# Patient Record
Sex: Female | Born: 1935 | Race: White | Hispanic: No | Smoking: Current every day smoker
Health system: Southern US, Community
[De-identification: ages and names within clinical notes are randomized; demographics above are authoritative.]

## PROBLEM LIST (undated history)

## (undated) DIAGNOSIS — F329 Major depressive disorder, single episode, unspecified: Secondary | ICD-10-CM

## (undated) DIAGNOSIS — R059 Cough, unspecified: Secondary | ICD-10-CM

## (undated) DIAGNOSIS — R05 Cough: Secondary | ICD-10-CM

## (undated) DIAGNOSIS — F0390 Unspecified dementia without behavioral disturbance: Secondary | ICD-10-CM

## (undated) DIAGNOSIS — F32A Depression, unspecified: Secondary | ICD-10-CM

## (undated) DIAGNOSIS — M1990 Unspecified osteoarthritis, unspecified site: Secondary | ICD-10-CM

## (undated) DIAGNOSIS — R51 Headache: Secondary | ICD-10-CM

## (undated) DIAGNOSIS — I2699 Other pulmonary embolism without acute cor pulmonale: Secondary | ICD-10-CM

## (undated) DIAGNOSIS — I10 Essential (primary) hypertension: Secondary | ICD-10-CM

## (undated) DIAGNOSIS — M069 Rheumatoid arthritis, unspecified: Secondary | ICD-10-CM

## (undated) DIAGNOSIS — R918 Other nonspecific abnormal finding of lung field: Secondary | ICD-10-CM

## (undated) HISTORY — PX: CARPAL TUNNEL RELEASE: SHX101

## (undated) HISTORY — PX: ROTATOR CUFF REPAIR: SHX139

## (undated) HISTORY — PX: EYE SURGERY: SHX253

## (undated) HISTORY — PX: SHOULDER ARTHROSCOPY: SHX128

## (undated) HISTORY — PX: FRACTURE SURGERY: SHX138

---

## 2013-08-26 ENCOUNTER — Other Ambulatory Visit: Payer: Self-pay | Admitting: Neurosurgery

## 2013-09-09 ENCOUNTER — Encounter (HOSPITAL_COMMUNITY): Payer: Self-pay

## 2013-09-10 ENCOUNTER — Other Ambulatory Visit (HOSPITAL_COMMUNITY): Payer: Self-pay

## 2013-09-13 ENCOUNTER — Encounter (HOSPITAL_COMMUNITY): Payer: Self-pay

## 2013-09-13 ENCOUNTER — Encounter (HOSPITAL_COMMUNITY)
Admission: RE | Admit: 2013-09-13 | Discharge: 2013-09-13 | Disposition: A | Discharge status: Home / Self Care | Payer: Medicare Other | Source: Ambulatory Visit | Attending: Neurosurgery | Admitting: Neurosurgery

## 2013-09-13 DIAGNOSIS — Z01818 Encounter for other preprocedural examination: Secondary | ICD-10-CM | POA: Insufficient documentation

## 2013-09-13 DIAGNOSIS — Z01812 Encounter for preprocedural laboratory examination: Secondary | ICD-10-CM | POA: Insufficient documentation

## 2013-09-13 HISTORY — DX: Major depressive disorder, single episode, unspecified: F32.9

## 2013-09-13 HISTORY — DX: Headache: R51

## 2013-09-13 HISTORY — DX: Depression, unspecified: F32.A

## 2013-09-13 HISTORY — DX: Unspecified dementia, unspecified severity, without behavioral disturbance, psychotic disturbance, mood disturbance, and anxiety: F03.90

## 2013-09-13 LAB — COMPREHENSIVE METABOLIC PANEL
ALT: 8 U/L (ref 0–35)
AST: 15 U/L (ref 0–37)
Albumin: 3.9 g/dL (ref 3.5–5.2)
Alkaline Phosphatase: 63 U/L (ref 39–117)
BUN: 12 mg/dL (ref 6–23)
CO2: 27 mEq/L (ref 19–32)
Calcium: 11 mg/dL — ABNORMAL HIGH (ref 8.4–10.5)
Chloride: 105 mEq/L (ref 96–112)
Creatinine, Ser: 1.33 mg/dL — ABNORMAL HIGH (ref 0.50–1.10)
GFR calc Af Amer: 43 mL/min — ABNORMAL LOW (ref >90)
GFR calc non Af Amer: 37 mL/min — ABNORMAL LOW (ref >90)
Glucose, Bld: 88 mg/dL (ref 70–99)
Potassium: 3.6 mEq/L (ref 3.5–5.1)
Sodium: 143 mEq/L (ref 135–145)
Total Bilirubin: 0.3 mg/dL (ref 0.3–1.2)
Total Protein: 7.2 g/dL (ref 6.0–8.3)

## 2013-09-13 LAB — CBC
HCT: 39 % (ref 36.0–46.0)
Hemoglobin: 12.9 g/dL (ref 12.0–15.0)
MCH: 29.4 pg (ref 26.0–34.0)
MCHC: 33.1 g/dL (ref 30.0–36.0)
MCV: 88.8 fL (ref 78.0–100.0)
Platelets: 275 10*3/uL (ref 150–400)
RBC: 4.39 MIL/uL (ref 3.87–5.11)
RDW: 15 % (ref 11.5–15.5)
WBC: 9 10*3/uL (ref 4.0–10.5)

## 2013-09-13 LAB — SURGICAL PCR SCREEN
MRSA, PCR: NEGATIVE
Staphylococcus aureus: POSITIVE — AB

## 2013-09-13 NOTE — Pre-Procedure Instructions (Signed)
Marcia Lynch  09/13/2013   Your procedure is scheduled on:  Monday, Sept. 29th   Report to [facility] at  5:30 AM.             Marcia Lynch will come through Entrance "A")  Call this number if you have problems the morning of surgery: [phone]   Remember:   Do not eat food or drink liquids after midnight Sunday.   Take these medicines the morning of surgery with A SIP OF WATER: Aricept, Zoloft, Tramadol   Do not wear jewelry, make-up or nail polish.  Do not wear lotions, powders, or perfumes. You may NOT wear deodorant.  Do not shave 48 hours prior to surgery.    Do not bring valuables to the hospital.  [facility] is not responsible for any belongings or valuables.  Contacts, dentures or bridgework may not be worn into surgery.   Leave suitcase in the car. After surgery it may be brought to your room.  For patients admitted to the hospital, checkout time is 11:00 AM the day of discharge.   Name and phone number of your driver:    Special Instructions: Shower using CHG 2 nights before surgery and the night before surgery.  If you shower the day of surgery use CHG.  Use special wash - you have one bottle of CHG for all showers.  You should use approximately 1/3 of the bottle for each shower.   Please read over the following fact sheets that you were given: Pain Booklet and Surgical Site Infection Prevention

## 2013-09-13 NOTE — Progress Notes (Addendum)
Pt had episode around 6 mths ago, where friends could not get her awake. She has not exhibited those symptoms since then.    She was then taken to [facility], where a number of tests were run.  I have requested notes, EKG, CXR, from that admission.  DA  (otherwise, EKG & CXR will be done  DOS)  Received Echo, 2013 Cxr, CT scans--EKG & CXR to be done the dos.  DA

## 2013-09-19 MED ORDER — CEFAZOLIN SODIUM-DEXTROSE 2-3 GM-% IV SOLR
2.0000 g | INTRAVENOUS | Status: AC
  Administered 2013-09-20: 2 g via INTRAVENOUS
  Filled 2013-09-19: qty 50

## 2013-09-20 ENCOUNTER — Encounter (HOSPITAL_COMMUNITY): Payer: Self-pay | Admitting: Certified Registered"

## 2013-09-20 ENCOUNTER — Encounter (HOSPITAL_COMMUNITY)
Admission: RE | Disposition: A | Discharge status: Home / Self Care | Payer: Self-pay | Source: Ambulatory Visit | Attending: Neurosurgery

## 2013-09-20 ENCOUNTER — Encounter (HOSPITAL_COMMUNITY): Payer: Self-pay | Admitting: Anesthesiology

## 2013-09-20 ENCOUNTER — Inpatient Hospital Stay (HOSPITAL_COMMUNITY)
Admission: RE | Admit: 2013-09-20 | Discharge: 2013-09-22 | DRG: 473 | Disposition: A | Discharge status: Home / Self Care | Payer: Medicare Other | Source: Ambulatory Visit | Attending: Neurosurgery | Admitting: Neurosurgery

## 2013-09-20 ENCOUNTER — Inpatient Hospital Stay (HOSPITAL_COMMUNITY): Payer: Medicare Other | Admitting: Anesthesiology

## 2013-09-20 ENCOUNTER — Inpatient Hospital Stay (HOSPITAL_COMMUNITY): Payer: Medicare Other

## 2013-09-20 DIAGNOSIS — F17200 Nicotine dependence, unspecified, uncomplicated: Secondary | ICD-10-CM | POA: Diagnosis present

## 2013-09-20 DIAGNOSIS — N289 Disorder of kidney and ureter, unspecified: Secondary | ICD-10-CM | POA: Diagnosis present

## 2013-09-20 DIAGNOSIS — M4712 Other spondylosis with myelopathy, cervical region: Principal | ICD-10-CM | POA: Diagnosis present

## 2013-09-20 DIAGNOSIS — F329 Major depressive disorder, single episode, unspecified: Secondary | ICD-10-CM | POA: Diagnosis present

## 2013-09-20 DIAGNOSIS — F3289 Other specified depressive episodes: Secondary | ICD-10-CM | POA: Diagnosis present

## 2013-09-20 DIAGNOSIS — Z79899 Other long term (current) drug therapy: Secondary | ICD-10-CM

## 2013-09-20 DIAGNOSIS — F0390 Unspecified dementia without behavioral disturbance: Secondary | ICD-10-CM | POA: Diagnosis present

## 2013-09-20 HISTORY — PX: POSTERIOR CERVICAL FUSION/FORAMINOTOMY: SHX5038

## 2013-09-20 SURGERY — POSTERIOR CERVICAL FUSION/FORAMINOTOMY LEVEL 4
Anesthesia: General | Site: Neck | Wound class: Clean

## 2013-09-20 MED ORDER — SODIUM CHLORIDE 0.9 % IV SOLN: 250.0000 mL | INTRAVENOUS | Status: DC

## 2013-09-20 MED ORDER — LIDOCAINE-EPINEPHRINE 1 %-1:100000 IJ SOLN
INTRAMUSCULAR | Status: DC | PRN
  Administered 2013-09-20: 7 mL

## 2013-09-20 MED ORDER — BUPIVACAINE HCL (PF) 0.25 % IJ SOLN
INTRAMUSCULAR | Status: DC | PRN
  Administered 2013-09-20: 7 mL

## 2013-09-20 MED ORDER — MIRTAZAPINE 15 MG PO TABS
15.0000 mg | ORAL_TABLET | Freq: Every evening | ORAL | Status: DC | PRN
  Administered 2013-09-21: 15 mg via ORAL
  Filled 2013-09-20: qty 1

## 2013-09-20 MED ORDER — TRAMADOL HCL 50 MG PO TABS
50.0000 mg | ORAL_TABLET | Freq: Three times a day (TID) | ORAL | Status: DC
  Administered 2013-09-20 – 2013-09-22 (×7): 50 mg via ORAL
  Filled 2013-09-20 (×7): qty 1

## 2013-09-20 MED ORDER — ALENDRONATE SODIUM 70 MG PO TABS: 70.0000 mg | ORAL_TABLET | ORAL | Status: DC

## 2013-09-20 MED ORDER — ONDANSETRON HCL 4 MG/2ML IJ SOLN
INTRAMUSCULAR | Status: DC | PRN
  Administered 2013-09-20: 4 mg via INTRAVENOUS

## 2013-09-20 MED ORDER — ARTIFICIAL TEARS OP OINT
TOPICAL_OINTMENT | OPHTHALMIC | Status: DC | PRN
  Administered 2013-09-20: 1 via OPHTHALMIC

## 2013-09-20 MED ORDER — FENTANYL CITRATE 0.05 MG/ML IJ SOLN
25.0000 ug | INTRAMUSCULAR | Status: DC | PRN
  Administered 2013-09-20: 25 ug via INTRAVENOUS
  Administered 2013-09-20: 50 ug via INTRAVENOUS

## 2013-09-20 MED ORDER — FENTANYL CITRATE 0.05 MG/ML IJ SOLN
INTRAMUSCULAR | Status: AC
  Filled 2013-09-20: qty 2

## 2013-09-20 MED ORDER — CEFAZOLIN SODIUM 1-5 GM-% IV SOLN
1.0000 g | Freq: Three times a day (TID) | INTRAVENOUS | Status: AC
  Administered 2013-09-20 – 2013-09-21 (×2): 1 g via INTRAVENOUS
  Filled 2013-09-20 (×3): qty 50

## 2013-09-20 MED ORDER — SERTRALINE HCL 100 MG PO TABS
100.0000 mg | ORAL_TABLET | Freq: Every day | ORAL | Status: DC
  Administered 2013-09-21 – 2013-09-22 (×2): 100 mg via ORAL
  Filled 2013-09-20 (×3): qty 1

## 2013-09-20 MED ORDER — SODIUM CHLORIDE 0.9 % IJ SOLN: 3.0000 mL | INTRAMUSCULAR | Status: DC | PRN

## 2013-09-20 MED ORDER — ONDANSETRON HCL 4 MG/2ML IJ SOLN: 4.0000 mg | INTRAMUSCULAR | Status: DC | PRN

## 2013-09-20 MED ORDER — SURGIFOAM 100 EX MISC
CUTANEOUS | Status: DC | PRN
  Administered 2013-09-20: 08:00:00 via TOPICAL

## 2013-09-20 MED ORDER — LIDOCAINE HCL (CARDIAC) 20 MG/ML IV SOLN
INTRAVENOUS | Status: DC | PRN
  Administered 2013-09-20: 60 mg via INTRAVENOUS

## 2013-09-20 MED ORDER — LACTATED RINGERS IV SOLN
INTRAVENOUS | Status: DC | PRN
  Administered 2013-09-20 (×4): via INTRAVENOUS

## 2013-09-20 MED ORDER — ROCURONIUM BROMIDE 100 MG/10ML IV SOLN
INTRAVENOUS | Status: DC | PRN
  Administered 2013-09-20: 10 mg via INTRAVENOUS
  Administered 2013-09-20: 50 mg via INTRAVENOUS

## 2013-09-20 MED ORDER — CYCLOBENZAPRINE HCL 10 MG PO TABS
10.0000 mg | ORAL_TABLET | Freq: Three times a day (TID) | ORAL | Status: DC | PRN
  Administered 2013-09-20: 10 mg via ORAL
  Filled 2013-09-20: qty 1

## 2013-09-20 MED ORDER — SODIUM CHLORIDE 0.9 % IJ SOLN
3.0000 mL | Freq: Two times a day (BID) | INTRAMUSCULAR | Status: DC
  Administered 2013-09-20 – 2013-09-22 (×4): 3 mL via INTRAVENOUS

## 2013-09-20 MED ORDER — DEXAMETHASONE SODIUM PHOSPHATE 4 MG/ML IJ SOLN
4.0000 mg | Freq: Four times a day (QID) | INTRAMUSCULAR | Status: DC
  Administered 2013-09-20: 4 mg via INTRAVENOUS
  Filled 2013-09-20 (×9): qty 1

## 2013-09-20 MED ORDER — INFLUENZA VAC SPLIT QUAD 0.5 ML IM SUSP
0.5000 mL | INTRAMUSCULAR | Status: AC
  Administered 2013-09-21: 0.5 mL via INTRAMUSCULAR
  Filled 2013-09-20: qty 0.5

## 2013-09-20 MED ORDER — MENTHOL 3 MG MT LOZG: 1.0000 | LOZENGE | OROMUCOSAL | Status: DC | PRN

## 2013-09-20 MED ORDER — NEOSTIGMINE METHYLSULFATE 1 MG/ML IJ SOLN
INTRAMUSCULAR | Status: DC | PRN
  Administered 2013-09-20: 3 mg via INTRAVENOUS

## 2013-09-20 MED ORDER — PHENYLEPHRINE HCL 10 MG/ML IJ SOLN
10.0000 mg | INTRAVENOUS | Status: DC | PRN
  Administered 2013-09-20: 15 ug/min via INTRAVENOUS

## 2013-09-20 MED ORDER — GLYCOPYRROLATE 0.2 MG/ML IJ SOLN
INTRAMUSCULAR | Status: DC | PRN
  Administered 2013-09-20: 0.2 mg via INTRAVENOUS
  Administered 2013-09-20: 0.4 mg via INTRAVENOUS

## 2013-09-20 MED ORDER — SIMVASTATIN 40 MG PO TABS
40.0000 mg | ORAL_TABLET | Freq: Every evening | ORAL | Status: DC
  Administered 2013-09-20 – 2013-09-22 (×3): 40 mg via ORAL
  Filled 2013-09-20 (×3): qty 1

## 2013-09-20 MED ORDER — OXYCODONE-ACETAMINOPHEN 5-325 MG PO TABS
1.0000 | ORAL_TABLET | ORAL | Status: DC | PRN
  Administered 2013-09-20 – 2013-09-21 (×2): 2 via ORAL
  Filled 2013-09-20 (×2): qty 2

## 2013-09-20 MED ORDER — ACETAMINOPHEN 500 MG PO TABS
1000.0000 mg | ORAL_TABLET | Freq: Two times a day (BID) | ORAL | Status: DC | PRN
  Filled 2013-09-20: qty 2

## 2013-09-20 MED ORDER — ALUM & MAG HYDROXIDE-SIMETH 200-200-20 MG/5ML PO SUSP: 30.0000 mL | Freq: Four times a day (QID) | ORAL | Status: DC | PRN

## 2013-09-20 MED ORDER — PHENOL 1.4 % MT LIQD: 1.0000 | OROMUCOSAL | Status: DC | PRN

## 2013-09-20 MED ORDER — ACETAMINOPHEN 650 MG RE SUPP: 650.0000 mg | RECTAL | Status: DC | PRN

## 2013-09-20 MED ORDER — DEXAMETHASONE SODIUM PHOSPHATE 10 MG/ML IJ SOLN
10.0000 mg | INTRAMUSCULAR | Status: AC
  Administered 2013-09-20: 10 mg via INTRAVENOUS
  Filled 2013-09-20: qty 1

## 2013-09-20 MED ORDER — EPHEDRINE SULFATE 50 MG/ML IJ SOLN
INTRAMUSCULAR | Status: DC | PRN
  Administered 2013-09-20: 20 mg via INTRAVENOUS
  Administered 2013-09-20: 10 mg via INTRAVENOUS
  Administered 2013-09-20: 20 mg via INTRAVENOUS

## 2013-09-20 MED ORDER — SUCCINYLCHOLINE CHLORIDE 20 MG/ML IJ SOLN
INTRAMUSCULAR | Status: DC | PRN
  Administered 2013-09-20: 100 mg via INTRAVENOUS

## 2013-09-20 MED ORDER — ACETAMINOPHEN 325 MG PO TABS: 650.0000 mg | ORAL_TABLET | ORAL | Status: DC | PRN

## 2013-09-20 MED ORDER — 0.9 % SODIUM CHLORIDE (POUR BTL) OPTIME
TOPICAL | Status: DC | PRN
  Administered 2013-09-20: 1000 mL

## 2013-09-20 MED ORDER — HYDROMORPHONE HCL PF 1 MG/ML IJ SOLN
INTRAMUSCULAR | Status: AC
  Filled 2013-09-20: qty 1

## 2013-09-20 MED ORDER — FENTANYL CITRATE 0.05 MG/ML IJ SOLN
INTRAMUSCULAR | Status: DC | PRN
  Administered 2013-09-20: 50 ug via INTRAVENOUS
  Administered 2013-09-20: 200 ug via INTRAVENOUS

## 2013-09-20 MED ORDER — BUPROPION HCL ER (XL) 150 MG PO TB24
150.0000 mg | ORAL_TABLET | Freq: Every day | ORAL | Status: DC
  Administered 2013-09-21 – 2013-09-22 (×2): 150 mg via ORAL
  Filled 2013-09-20 (×3): qty 1

## 2013-09-20 MED ORDER — HYDROMORPHONE HCL PF 1 MG/ML IJ SOLN
0.5000 mg | INTRAMUSCULAR | Status: DC | PRN
  Administered 2013-09-20: 1 mg via INTRAVENOUS

## 2013-09-20 MED ORDER — BACITRACIN ZINC 500 UNIT/GM EX OINT
TOPICAL_OINTMENT | CUTANEOUS | Status: DC | PRN
  Administered 2013-09-20: 1 via TOPICAL

## 2013-09-20 MED ORDER — DONEPEZIL HCL 10 MG PO TABS
20.0000 mg | ORAL_TABLET | Freq: Every day | ORAL | Status: DC
  Administered 2013-09-21 – 2013-09-22 (×2): 20 mg via ORAL
  Filled 2013-09-20 (×3): qty 2

## 2013-09-20 MED ORDER — DROPERIDOL 2.5 MG/ML IJ SOLN: 0.6250 mg | INTRAMUSCULAR | Status: DC | PRN

## 2013-09-20 MED ORDER — PHENYLEPHRINE HCL 10 MG/ML IJ SOLN
INTRAMUSCULAR | Status: DC | PRN
  Administered 2013-09-20 (×4): 80 ug via INTRAVENOUS

## 2013-09-20 MED ORDER — SODIUM CHLORIDE 0.9 % IR SOLN
Status: DC | PRN
  Administered 2013-09-20: 08:00:00

## 2013-09-20 MED ORDER — DEXAMETHASONE 4 MG PO TABS
4.0000 mg | ORAL_TABLET | Freq: Four times a day (QID) | ORAL | Status: DC
  Administered 2013-09-20 – 2013-09-22 (×8): 4 mg via ORAL
  Filled 2013-09-20 (×13): qty 1

## 2013-09-20 MED ORDER — DOCUSATE SODIUM 100 MG PO CAPS
100.0000 mg | ORAL_CAPSULE | Freq: Two times a day (BID) | ORAL | Status: DC
  Administered 2013-09-20 – 2013-09-22 (×5): 100 mg via ORAL
  Filled 2013-09-20 (×5): qty 1

## 2013-09-20 MED ORDER — THROMBIN 5000 UNITS EX SOLR
CUTANEOUS | Status: DC | PRN
  Administered 2013-09-20: 5000 [IU] via TOPICAL

## 2013-09-20 MED ORDER — PROPOFOL 10 MG/ML IV BOLUS
INTRAVENOUS | Status: DC | PRN
  Administered 2013-09-20: 40 mg via INTRAVENOUS
  Administered 2013-09-20: 30 mg via INTRAVENOUS
  Administered 2013-09-20: 120 mg via INTRAVENOUS

## 2013-09-20 SURGICAL SUPPLY — 69 items
BAG DECANTER FOR FLEXI CONT (MISCELLANEOUS) ×2 IMPLANT
BENZOIN TINCTURE PRP APPL 2/3 (GAUZE/BANDAGES/DRESSINGS) ×2 IMPLANT
BLADE SURG 11 STRL SS (BLADE) ×2 IMPLANT
BLADE SURG ROTATE 9660 (MISCELLANEOUS) ×2 IMPLANT
BUR MATCHSTICK NEURO 3.0 LAGG (BURR) ×2 IMPLANT
CANISTER SUCTION 2500CC (MISCELLANEOUS) ×2 IMPLANT
CAP ELLIPSE LOCKING (Cap) ×18 IMPLANT
CLOTH BEACON ORANGE TIMEOUT ST (SAFETY) ×2 IMPLANT
CONT SPEC 4OZ CLIKSEAL STRL BL (MISCELLANEOUS) ×2 IMPLANT
DECANTER SPIKE VIAL GLASS SM (MISCELLANEOUS) ×2 IMPLANT
DERMABOND ADHESIVE PROPEN (GAUZE/BANDAGES/DRESSINGS) ×1
DERMABOND ADVANCED (GAUZE/BANDAGES/DRESSINGS) ×1
DERMABOND ADVANCED .7 DNX12 (GAUZE/BANDAGES/DRESSINGS) ×1 IMPLANT
DERMABOND ADVANCED .7 DNX6 (GAUZE/BANDAGES/DRESSINGS) ×1 IMPLANT
DRAPE C-ARM 42X72 X-RAY (DRAPES) IMPLANT
DRAPE LAPAROTOMY 100X72 PEDS (DRAPES) ×2 IMPLANT
DRAPE MICROSCOPE ZEISS OPMI (DRAPES) IMPLANT
DRAPE POUCH INSTRU U-SHP 10X18 (DRAPES) ×2 IMPLANT
DRAPE SURG 17X23 STRL (DRAPES) ×8 IMPLANT
DRSG OPSITE 4X5.5 SM (GAUZE/BANDAGES/DRESSINGS) ×4 IMPLANT
DRSG OPSITE POSTOP 4X8 (GAUZE/BANDAGES/DRESSINGS) ×2 IMPLANT
DURAPREP 26ML APPLICATOR (WOUND CARE) ×2 IMPLANT
ELECT REM PT RETURN 9FT ADLT (ELECTROSURGICAL) ×2
ELECTRODE REM PT RTRN 9FT ADLT (ELECTROSURGICAL) ×1 IMPLANT
EVACUATOR 1/8 PVC DRAIN (DRAIN) ×2 IMPLANT
GAUZE SPONGE 4X4 16PLY XRAY LF (GAUZE/BANDAGES/DRESSINGS) IMPLANT
GLOVE BIO SURGEON STRL SZ8 (GLOVE) ×2 IMPLANT
GLOVE BIOGEL PI IND STRL 8 (GLOVE) ×4 IMPLANT
GLOVE BIOGEL PI INDICATOR 8 (GLOVE) ×4
GLOVE ECLIPSE 8.5 STRL (GLOVE) ×2 IMPLANT
GLOVE EXAM NITRILE LRG STRL (GLOVE) ×4 IMPLANT
GLOVE EXAM NITRILE MD LF STRL (GLOVE) IMPLANT
GLOVE EXAM NITRILE XL STR (GLOVE) IMPLANT
GLOVE EXAM NITRILE XS STR PU (GLOVE) IMPLANT
GLOVE INDICATOR 8.5 STRL (GLOVE) ×2 IMPLANT
GLOVE SURG SS PI 8.0 STRL IVOR (GLOVE) ×8 IMPLANT
GOWN BRE IMP SLV AUR LG STRL (GOWN DISPOSABLE) IMPLANT
GOWN BRE IMP SLV AUR XL STRL (GOWN DISPOSABLE) ×6 IMPLANT
GOWN STRL REIN 2XL LVL4 (GOWN DISPOSABLE) ×2 IMPLANT
HEMOSTAT POWDER SURGIFOAM 1G (HEMOSTASIS) ×2 IMPLANT
KIT BASIN OR (CUSTOM PROCEDURE TRAY) ×2 IMPLANT
KIT INFUSE X SMALL 1.4CC (Orthopedic Implant) ×2 IMPLANT
KIT ROOM TURNOVER OR (KITS) ×2 IMPLANT
MARKER SKIN DUAL TIP RULER LAB (MISCELLANEOUS) ×2 IMPLANT
NEEDLE HYPO 25X1 1.5 SAFETY (NEEDLE) ×2 IMPLANT
NEEDLE SPNL 20GX3.5 QUINCKE YW (NEEDLE) ×2 IMPLANT
NS IRRIG 1000ML POUR BTL (IV SOLUTION) ×2 IMPLANT
PACK LAMINECTOMY NEURO (CUSTOM PROCEDURE TRAY) ×2 IMPLANT
PAD ARMBOARD 7.5X6 YLW CONV (MISCELLANEOUS) ×6 IMPLANT
PIN MAYFIELD SKULL DISP (PIN) ×2 IMPLANT
PUTTY BONE DBX 5CC MIX (Putty) ×2 IMPLANT
ROD SPINE POST 3.5X80 (Rod) ×4 IMPLANT
RUBBERBAND STERILE (MISCELLANEOUS) IMPLANT
SCREW 3.5X12MM (Screw) ×18 IMPLANT
SPONGE GAUZE 4X4 12PLY (GAUZE/BANDAGES/DRESSINGS) ×2 IMPLANT
SPONGE LAP 4X18 X RAY DECT (DISPOSABLE) IMPLANT
SPONGE SURGIFOAM ABS GEL 100 (HEMOSTASIS) ×2 IMPLANT
STRIP CLOSURE SKIN 1/2X4 (GAUZE/BANDAGES/DRESSINGS) ×2 IMPLANT
SUT ETHILON 4 0 PS 2 18 (SUTURE) IMPLANT
SUT VIC AB 0 CT1 18XCR BRD8 (SUTURE) ×2 IMPLANT
SUT VIC AB 0 CT1 8-18 (SUTURE) ×2
SUT VIC AB 2-0 CT1 18 (SUTURE) ×4 IMPLANT
SUT VICRYL 4-0 PS2 18IN ABS (SUTURE) ×4 IMPLANT
SYR 20ML ECCENTRIC (SYRINGE) ×2 IMPLANT
TAPE STRIPS DRAPE STRL (GAUZE/BANDAGES/DRESSINGS) ×2 IMPLANT
TOWEL OR 17X24 6PK STRL BLUE (TOWEL DISPOSABLE) ×2 IMPLANT
TOWEL OR 17X26 10 PK STRL BLUE (TOWEL DISPOSABLE) ×2 IMPLANT
TRAY FOLEY CATH 14FRSI W/METER (CATHETERS) ×2 IMPLANT
WATER STERILE IRR 1000ML POUR (IV SOLUTION) ×2 IMPLANT

## 2013-09-20 NOTE — H&P (Signed)
Karlyn Glasco is an 79 y.o. female.   Chief Complaint: Neck pain bilateral shoulder pain and weakness HPI: Patient is a very pleasant 79 year old female is a progress worsening neck pain and pain in the both arms and shoulders with weakness numbness and tingling in her hands and her fingers. This is progressively worse over last several weeks and months she's having increasing difficulty walking with difficulty opening jars and buttoning her clothes. Her clinical exam was consistent with myelopathy her imaging studies revealed severe cervical spondylosis with stenosis spinal cord compression and foraminal stenosis from C3-C7. Due to patient's progression of clinical syndrome clinical exam consistent with a cervical myelopathy and imaging findings I recommended a posterior cervical decompression and fusion. I extensively reviewed the risks and benefits of the operation with the patient as well as perioperative course expectations of outcome alternatives of surgery and she understood and agreed to proceed forward.  Past Medical History  Diagnosis Date  . Headache(784.0)   . Depression     TAKING ZOLOFT FOR A LONG TIME  . Dementia     STARTING SIGNS OF DEMENTIA    Past Surgical History  Procedure Laterality Date  . Fracture surgery      RIGHT HIP-HAS SCREW  . Rotator cuff repair      RIGHT SHOULDER  . Shoulder arthroscopy      ? LEFT BONE SPURSS  . Carpal tunnel release      RIGHT  . Eye surgery      BILATERAL CATARACTS    No family history on file. Social History:  reports that she has been smoking Cigarettes.  She has a 17 pack-year smoking history. She does not have any smokeless tobacco history on file. She reports that she does not drink alcohol or use illicit drugs.  Allergies:  Allergies  Allergen Reactions  . Cymbalta [Duloxetine Hcl] Rash    Medications Prior to Admission  Medication Sig Dispense Refill  . acetaminophen (TYLENOL) 500 MG tablet Take 1,000 mg by mouth 2  (two) times daily as needed for pain.      Marland Kitchen alendronate (FOSAMAX) 70 MG tablet Take 70 mg by mouth every 7 (seven) days. Take with a full glass of water on an empty stomach.  On Sunday      . buPROPion (WELLBUTRIN XL) 150 MG 24 hr tablet Take 150 mg by mouth daily.      Marland Kitchen donepezil (ARICEPT) 10 MG tablet Take 20 mg by mouth daily.      . mirtazapine (REMERON) 15 MG tablet Take 15 mg by mouth at bedtime as needed (restless legs).      . Multiple Vitamins-Minerals (MULTIVITAMIN PO) Take 1 tablet by mouth daily.      . sertraline (ZOLOFT) 100 MG tablet Take 100 mg by mouth daily.      . simvastatin (ZOCOR) 40 MG tablet Take 40 mg by mouth every evening.      . traMADol (ULTRAM) 50 MG tablet Take 50 mg by mouth 3 (three) times daily.        No results found for this or any previous visit (from the past 48 hour(s)). No results found.  Review of Systems  Constitutional: Negative.   HENT: Positive for neck pain.   Eyes: Negative.   Respiratory: Negative.   Cardiovascular: Negative.   Gastrointestinal: Positive for heartburn, nausea and diarrhea.  Genitourinary: Negative.   Musculoskeletal: Positive for myalgias and joint pain.  Skin: Negative.   Neurological: Positive for tingling and sensory change.  Endo/Heme/Allergies: Negative.     Blood pressure 158/58, pulse 70, temperature 97.3 F (36.3 C), resp. rate 18, SpO2 95.00%. Physical Exam  Constitutional: She is oriented to person, place, and time. She appears well-developed and well-nourished.  HENT:  Head: Normocephalic.  Eyes: Pupils are equal, round, and reactive to light.  Neck: Normal range of motion.  Respiratory: Effort normal.  GI: Soft.  Musculoskeletal: Normal range of motion.  Neurological: She is alert and oriented to person, place, and time. She displays atrophy. She exhibits abnormal muscle tone. GCS eye subscore is 4. GCS verbal subscore is 5. GCS motor subscore is 6.  Reflex Scores:      Bicep reflexes are 2+ on  the right side and 2+ on the left side.      Brachioradialis reflexes are 2+ on the right side and 2+ on the left side.      Patellar reflexes are 3+ on the right side and 3+ on the left side.      Achilles reflexes are 3+ on the right side and 3+ on the left side. Strength is diffusely for the placenta 5 in her upper to me his deltoids, biceps, triceps, wrist flexion, wrist extension, and intrinsics. Lower extremity strengths is 5 out of 5  Skin: Skin is warm.     Assessment/Plan 81 presents for posterior cervical decompression fusion C3-C7. She had a little bit of diarrhea the week leading in the surgery however she reports it is only looser stools at about 2-3 times a day when are normal his once a day. She has been taking a medicine for constipation over the last week. She denies any vomiting she had 1 episode of reflux on the way to the hospital this morning she denies any fevers or chills and no change in her activity energy level and fatigue level from her normal. I do not think this represents an infectious gastroenteritis. Therefore I believe it is okay proceed forward with posterior cervical decompression fusion. Tajha Sammarco P 09/20/2013, 7:25 AM

## 2013-09-20 NOTE — Transfer of Care (Signed)
Immediate Anesthesia Transfer of Care Note  Patient: Marcia Lynch  Procedure(s) Performed: Procedure(s) with comments: CERVICAL THREE TO CERVICAL SEVEN  POSTERIOR CERVICAL FUSION (N/A) - CERVICAL THREE TO CERVICAL SEVEN  POSTERIOR CERVICAL FUSION  Patient Location: PACU  Anesthesia Type:General  Level of Consciousness: awake, alert , oriented and patient cooperative  Airway & Oxygen Therapy: Patient Spontanous Breathing and Patient connected to face mask oxygen  Post-op Assessment: Report given to PACU RN, Post -op Vital signs reviewed and stable and Patient moving all extremities  Post vital signs: Reviewed and stable  Complications: No apparent anesthesia complications

## 2013-09-20 NOTE — Anesthesia Preprocedure Evaluation (Addendum)
Anesthesia Evaluation  Patient identified by MRN, date of birth, ID band Patient awake    Reviewed: Allergy & Precautions, H&P , NPO status , Patient's Chart, lab work & pertinent test results, reviewed documented beta blocker date and time   History of Anesthesia Complications Negative for: history of anesthetic complications  Airway Mallampati: II TM Distance: >3 FB Neck ROM: Full    Dental  (+) Edentulous Upper and Edentulous Lower   Pulmonary Current Smoker,  breath sounds clear to auscultation  Pulmonary exam normal       Cardiovascular negative cardio ROS  Rhythm:Regular Rate:Normal     Neuro/Psych Depression    GI/Hepatic N/v with diarrhea today since saturday   Endo/Other  negative endocrine ROS  Renal/GU Renal disease (creat 1.33)     Musculoskeletal   Abdominal   Peds  Hematology   Anesthesia Other Findings   Reproductive/Obstetrics                          Anesthesia Physical Anesthesia Plan  ASA: III  Anesthesia Plan: General   Post-op Pain Management:    Induction: Intravenous  Airway Management Planned: Oral ETT and Video Laryngoscope Planned  Additional Equipment:   Intra-op Plan:   Post-operative Plan: Extubation in OR  Informed Consent: I have reviewed the patients History and Physical, chart, labs and discussed the procedure including the risks, benefits and alternatives for the proposed anesthesia with the patient or authorized representative who has indicated his/her understanding and acceptance.     Plan Discussed with: Surgeon, CRNA and Anesthesiologist  Anesthesia Plan Comments: (Plan routine monitors, GETA with VideoGlide intubation)       Anesthesia Quick Evaluation

## 2013-09-20 NOTE — Anesthesia Postprocedure Evaluation (Signed)
  Anesthesia Post-op Note  Patient: Marcia Lynch  Procedure(s) Performed: Procedure(s) with comments: CERVICAL THREE TO CERVICAL SEVEN  POSTERIOR CERVICAL FUSION (N/A) - CERVICAL THREE TO CERVICAL SEVEN  POSTERIOR CERVICAL FUSION  Patient Location: PACU  Anesthesia Type:General  Level of Consciousness: awake, alert , oriented and patient cooperative  Airway and Oxygen Therapy: Patient Spontanous Breathing and Patient connected to nasal cannula oxygen  Post-op Pain: none  Post-op Assessment: Post-op Vital signs reviewed, Patient's Cardiovascular Status Stable, Respiratory Function Stable, Patent Airway, No signs of Nausea or vomiting and Pain level controlled  Post-op Vital Signs: Reviewed and stable  Complications: No apparent anesthesia complications

## 2013-09-20 NOTE — Preoperative (Signed)
Beta Blockers   Reason not to administer Beta Blockers:Not Applicable 

## 2013-09-20 NOTE — Anesthesia Procedure Notes (Signed)
Procedure Name: Intubation Date/Time: 09/20/2013 7:46 AM Performed by: Jerilee Hoh Pre-anesthesia Checklist: Patient identified, Emergency Drugs available, Suction available and Patient being monitored Patient Re-evaluated:Patient Re-evaluated prior to inductionOxygen Delivery Method: Circle system utilized Preoxygenation: Pre-oxygenation with 100% oxygen Intubation Type: IV induction, Cricoid Pressure applied and Rapid sequence Laryngoscope Size: Mac and 3 Grade View: Grade I Tube type: Oral Tube size: 7.5 mm Number of attempts: 1 Airway Equipment and Method: Stylet Placement Confirmation: ETT inserted through vocal cords under direct vision,  positive ETCO2 and breath sounds checked- equal and bilateral Secured at: 21 cm Tube secured with: Tape Dental Injury: Teeth and Oropharynx as per pre-operative assessment

## 2013-09-20 NOTE — Op Note (Signed)
Preop diagnosis: Cervical myelopathy from severe cervical spondylosis with stenosis from C3-C7  Postoperative diagnosis: Same  Procedure: Posterior cervical decompressive laminectomy partial facetectomies and foraminotomies at C3-4, C4-5, C5-6, C6-7 and posterior cervical fusion with lateral mass screws from C3-C7 posterior lateral arthrodesis C3-C7 using local autograft mixed with DBX and BMP. Placement of a medium Hemovac drain.   Surgeon: Jillyn Hidden Phuong Hillary  Assistant: Barnett Abu  Anesthesia: Gen.  EBL: Minimal  History of present illness: Patient is a 64 of them is a progress worsening neck bilateral arm pain and numbness tingling weakness in her hands difficulty walking. Exam was consistent with a cervical myelopathy imaging reveals severe cervical stenosis C3-C7 so recommended posterior cervical decompression fusion C3-C7 I extensively reviewed the risks and benefits of the operation with the patient as well as perioperative course and expectations of outcome and alternatives surgery and she understood and agreed to proceed forward.  Operative procedure: Patient was brought into the or was induced under general anesthesia and positioned prone in pins the neck in slight flexion the back 7  And draped in sterile fashion after infiltration 10 cc lidocaine with epi a midline incision was made and Bovie cautery stick the subcutaneous tissues subperiosteal dissections care lamina of C3-4-5 6 and 7. Interoperative x-ray confirmed the appropriate level so that she's taken to the lateral mass screw placement infero medial quadrants were selected for a pilot hole and then 12 mm holes were drilled at an angle a trajectory extending out the superolateral lateral mass quadrant. First on the right side. All screws were probed then the cath underneath cortex and 12 mm screws were all placed all screws excellent purchase on the right from C3-C7. Tensions and taken the left side in a similar fashion pilot holes were  drilled then subsequent drill holes were drilled with a little bit in the C6 lateral mass screw broke through the impressive lateral mass were elected not to place a screw tear all the other near cortex were tapped and 12 mm screws were placed at 3,4,5,7. After all screws in place to second and central decompression spinous processes and removed at C3, C4, C5, C6, and C7. The decompression was begun walking down the lateral gutters any side and then removing the central lamina en bloc. Then marking laterally a foraminotomies were performed with a 2 mm Kerrison punch bilaterally at C3, C4, C5, C6, and C7. After all foraminotomies been completed and all the nerve roots and spinal cord decompressed the spinal cord was easily visualized and was pulsating well. Then dissecting to the copious irrigation meticulous hemostasis and aggressive decortication was care on the lateral masses and facet joints. Local autograft was impacted in the facet joints. A strip of small strip of BMP was in place on each lateral compartment lateral the screw then DBX was overlaid top of the BMP sponge and rods were cut and fashioned and contoured and all screws were anchored down place. After meticulous in a stasis was again maintained posterior fluoroscopy confirmed good position of all the implants a medium Hemovac drain was placed and the wounds closed in layers with after Vicryl the skin was closed running 4 subcuticular benzo and Steri-Strips were applied patient recovered in stable condition. At the end of case on it counts and sponge counts were correct.

## 2013-09-21 ENCOUNTER — Encounter (HOSPITAL_COMMUNITY): Payer: Self-pay | Admitting: Neurosurgery

## 2013-09-21 LAB — POCT I-STAT 4, (NA,K, GLUC, HGB,HCT)
Glucose, Bld: 94 mg/dL (ref 70–99)
HCT: 34 % — ABNORMAL LOW (ref 36.0–46.0)
Hemoglobin: 11.6 g/dL — ABNORMAL LOW (ref 12.0–15.0)
Potassium: 3.6 mEq/L (ref 3.5–5.1)
Sodium: 143 mEq/L (ref 135–145)

## 2013-09-21 MED ORDER — PNEUMOCOCCAL VAC POLYVALENT 25 MCG/0.5ML IJ INJ
0.5000 mL | INJECTION | INTRAMUSCULAR | Status: AC
  Administered 2013-09-22: 0.5 mL via INTRAMUSCULAR
  Filled 2013-09-21: qty 0.5

## 2013-09-21 NOTE — Progress Notes (Signed)
Foley removed 0500. Fluids encouraged. Patient ambulated down hallway steady gait, mild pain. Will continue to monitor.

## 2013-09-21 NOTE — Progress Notes (Signed)
Subjective: Patient reports Overall she's doing well hernia significantly improved in her arms and hands 7 no shoulder pain very minimal neck pain  Objective: Vital signs in last 24 hours: Temp:  [96.8 F (36 C)-98.3 F (36.8 C)] 98.2 F (36.8 C) (09/30 0456) Pulse Rate:  [72-124] 73 (09/30 0456) Resp:  [15-32] 18 (09/30 0456) BP: (107-165)/(43-79) 114/79 mmHg (09/30 0456) SpO2:  [96 %-100 %] 97 % (09/30 0456)  Intake/Output from previous day: 09/29 0701 - 09/30 0700 In: 3003 [Lynch.O.:400; I.V.:2603] Out: 1835 [Urine:1450; Drains:85; Blood:300] Intake/Output this shift:    Awake alert oriented strength 5 out of 5 wound clean and dry  Lab Results: No results found for this basename: WBC, HGB, HCT, PLT,  in the last 72 hours BMET No results found for this basename: NA, K, CL, CO2, GLUCOSE, BUN, CREATININE, CALCIUM,  in the last 72 hours  Studies/Results: Dg Cervical Spine 1 View  09/20/2013   CLINICAL DATA:  Neck pain.  Severe stenosis.  EXAM: DG CERVICAL SPINE - 1 VIEW; DG C-ARM 1-60 MIN  COMPARISON:  None.  FINDINGS: The lateral C-arm radiograph demonstrate posterior instrumentation status post decompressive laminectomy C3-C7 with 4 level fusion. The lower aspect of the fusion construct is poorly seen but there are no gross adverse features.  IMPRESSION: As above.   Electronically Signed   By: Davonna Belling M.D.   On: 09/20/2013 13:57   Dg C-arm 1-60 Min  09/20/2013   CLINICAL DATA:  Neck pain.  Severe stenosis.  EXAM: DG CERVICAL SPINE - 1 VIEW; DG C-ARM 1-60 MIN  COMPARISON:  None.  FINDINGS: The lateral C-arm radiograph demonstrate posterior instrumentation status post decompressive laminectomy C3-C7 with 4 level fusion. The lower aspect of the fusion construct is poorly seen but there are no gross adverse features.  IMPRESSION: As above.   Electronically Signed   By: Davonna Belling M.D.   On: 09/20/2013 13:57    Assessment/Plan: Posterior day 1 for posterior cervical decompression  fusion C3 the C7 continue mobilization with physical therapy possible discharge tomorrow   LOS: 1 day     Marcia Lynch 09/21/2013, 7:07 AM

## 2013-09-22 ENCOUNTER — Encounter (HOSPITAL_COMMUNITY): Payer: Self-pay | Admitting: *Deleted

## 2013-09-22 MED ORDER — CYCLOBENZAPRINE HCL 10 MG PO TABS: 10.0000 mg | ORAL_TABLET | Freq: Three times a day (TID) | ORAL | Status: DC | PRN

## 2013-09-22 MED ORDER — OXYCODONE-ACETAMINOPHEN 5-325 MG PO TABS: 1.0000 | ORAL_TABLET | ORAL | Status: DC | PRN

## 2013-09-22 NOTE — Progress Notes (Signed)
Patient ID: Marcia Lynch, female   DOB: 06-16-1931, 79 y.o.   MRN: [id number] Patient doing well awake alert oriented strength 5 out of 5 in her upper and lower extremity is incision was good his drain is functioning well she progresses well with physical therapist more mobile discharge her later on this afternoon

## 2013-09-22 NOTE — Evaluation (Signed)
Physical Therapy Evaluation Patient Details Name: Marcia Lynch MRN: [id number] DOB: 06-02-1931 Today's Date: 09/22/2013 Time: 4098-1191 PT Time Calculation (min): 18 min  PT Assessment / Plan / Recommendation History of Present Illness  pt presents with C3-7 PCDF.    Clinical Impression  Pt demos good mobility.  No further PT needs at this time.      PT Assessment  Patent does not need any further PT services    Follow Up Recommendations  No PT follow up    Does the patient have the potential to tolerate intense rehabilitation      Barriers to Discharge        Equipment Recommendations  None recommended by PT    Recommendations for Other Services     Frequency      Precautions / Restrictions Precautions Precautions: Cervical Restrictions Weight Bearing Restrictions: No   Pertinent Vitals/Pain Pt indicates shoulders feel sore.  Premedicated.        Mobility  Bed Mobility Bed Mobility: Supine to Sit;Sitting - Scoot to Edge of Bed Supine to Sit: 6: Modified independent (Device/Increase time);HOB flat Sitting - Scoot to Edge of Bed: 6: Modified independent (Device/Increase time) Details for Bed Mobility Assistance: needs increased time, but able to complete without A.   Transfers Transfers: Sit to Stand;Stand to Sit Sit to Stand: 6: Modified independent (Device/Increase time);With upper extremity assist;From bed Stand to Sit: 6: Modified independent (Device/Increase time);With upper extremity assist;To chair/3-in-1 Ambulation/Gait Ambulation/Gait Assistance: 7: Independent Ambulation Distance (Feet): 200 Feet Assistive device: None Ambulation/Gait Assistance Details: No LOB.  Demos good mobility.   Gait Pattern: Step-through pattern;Decreased stride length Stairs: Yes Stairs Assistance: 6: Modified independent (Device/Increase time) Stair Management Technique: One rail Left;Forwards Number of Stairs: 7 Wheelchair Mobility Wheelchair Mobility: No     Exercises     PT Diagnosis:    PT Problem List:   PT Treatment Interventions:       PT Goals(Current goals can be found in the care plan section)    Visit Information  Last PT Received On: 09/22/13 Assistance Needed: +1 History of Present Illness: pt presents with C3-7 PCDF.         Prior Functioning  Home Living Family/patient expects to be discharged to:: Private residence Living Arrangements: Non-relatives/Friends Available Help at Discharge: Available PRN/intermittently Home Access: Stairs to enter Entrance Stairs-Number of Steps: 3 Entrance Stairs-Rails: Left Home Layout: One level Home Equipment: None Additional Comments: pt rents a room from a friend.   Prior Function Level of Independence: Independent Communication Communication: No difficulties    Cognition  Cognition Arousal/Alertness: Awake/alert Behavior During Therapy: WFL for tasks assessed/performed Overall Cognitive Status: Within Functional Limits for tasks assessed    Extremity/Trunk Assessment Upper Extremity Assessment Upper Extremity Assessment: Overall WFL for tasks assessed Lower Extremity Assessment Lower Extremity Assessment: Overall WFL for tasks assessed   Balance Balance Balance Assessed: No  End of Session PT - End of Session Equipment Utilized During Treatment: Gait belt;Cervical collar Activity Tolerance: Patient tolerated treatment well Patient left: in chair;with call bell/phone within reach Nurse Communication: Mobility status  GP     Sunny Schlein, [place] [phone] 09/22/2013, 12:10 PM

## 2013-09-22 NOTE — Discharge Summary (Signed)
  Physician Discharge Summary  Patient ID: Marcia Lynch MRN: [id number] DOB/AGE: [birth year] 77 y.o.  Admit date: 09/20/2013 Discharge date: 09/22/2013  Admission Diagnoses: Cervical spondylitic myelopathy  Discharge Diagnoses: Same Active Problems:   * No active hospital problems. *   Discharged Condition: good  Hospital Course: Patient was admitted underwent posterior cervical laminectomy and decompression and fusion. Postoperatively patient did very well recovered in the floor on the floor on postop day 1 and 2 she progressively mobilize more more physical outpatient therapy. She was independent requiring no additional therapy needs a stable for discharge on postop day 2. Patient was discharged home on pain medication muscle relaxants.  Consults: Significant Diagnostic Studies: Treatments: Posterior cervical decompression fusion Discharge Exam: Blood pressure 150/63, pulse 80, temperature 98.3 F (36.8 C), temperature source Oral, resp. rate 20, height 5\' 4"  (1.626 m), weight 51.801 kg (114 lb 3.2 oz), SpO2 95.00%. Strength out of 5 wound clean and dry  Disposition: Home     Medication List         acetaminophen 500 MG tablet  Commonly known as:  TYLENOL  Take 1,000 mg by mouth 2 (two) times daily as needed for pain.     alendronate 70 MG tablet  Commonly known as:  FOSAMAX  Take 70 mg by mouth every 7 (seven) days. Take with a full glass of water on an empty stomach.  On Sunday     buPROPion 150 MG 24 hr tablet  Commonly known as:  WELLBUTRIN XL  Take 150 mg by mouth daily.     cyclobenzaprine 10 MG tablet  Commonly known as:  FLEXERIL  Take 1 tablet (10 mg total) by mouth 3 (three) times daily as needed for muscle spasms.     donepezil 10 MG tablet  Commonly known as:  ARICEPT  Take 20 mg by mouth daily.     mirtazapine 15 MG tablet  Commonly known as:  REMERON  Take 15 mg by mouth at bedtime as needed (restless legs).     MULTIVITAMIN PO  Take 1 tablet  by mouth daily.     oxyCODONE-acetaminophen 5-325 MG per tablet  Commonly known as:  PERCOCET/ROXICET  Take 1-2 tablets by mouth every 4 (four) hours as needed.     sertraline 100 MG tablet  Commonly known as:  ZOLOFT  Take 100 mg by mouth daily.     simvastatin 40 MG tablet  Commonly known as:  ZOCOR  Take 40 mg by mouth every evening.     traMADol 50 MG tablet  Commonly known as:  ULTRAM  Take 50 mg by mouth 3 (three) times daily.           Follow-up Information   Follow up with [facility] P, MD.   Specialty:  Neurosurgery   Contact information:   [address] [place] [address] [phone]       Signed: Regene Mccarthy P 09/22/2013, 3:58 PM

## 2013-09-22 NOTE — Progress Notes (Signed)
Discharge orders received. Vitals stable, drain removed by dr Wynetta Emery. Marcia Lynch op education completed and reviewed with patient and family. Pt to be discharged home with no needs by daughter.

## 2013-09-22 NOTE — Progress Notes (Signed)
Patient ambulated around entire unit with no assistance, gait steady, no dizziness. Weaned off O2, sats 96% on RA after ambulation. Denies pain, just has "soreness" which patient states is tolerable with the ultram. Patient still has hemovac- emptied 75cc. she is wanting to go home. Will continue to monitor.

## 2013-09-28 ENCOUNTER — Inpatient Hospital Stay (HOSPITAL_COMMUNITY)
Admission: AD | Admit: 2013-09-28 | Discharge: 2013-10-04 | DRG: 920 | Disposition: A | Discharge status: Home / Self Care | Payer: Medicare Other | Source: Ambulatory Visit | Attending: Neurosurgery | Admitting: Neurosurgery

## 2013-09-28 ENCOUNTER — Other Ambulatory Visit: Payer: Self-pay | Admitting: Neurosurgery

## 2013-09-28 ENCOUNTER — Observation Stay (HOSPITAL_COMMUNITY): Payer: Medicare Other

## 2013-09-28 ENCOUNTER — Encounter (HOSPITAL_COMMUNITY): Payer: Self-pay | Admitting: *Deleted

## 2013-09-28 DIAGNOSIS — M5090 Cervical disc disorder, unspecified, unspecified cervical region: Secondary | ICD-10-CM | POA: Diagnosis present

## 2013-09-28 DIAGNOSIS — Y831 Surgical operation with implant of artificial internal device as the cause of abnormal reaction of the patient, or of later complication, without mention of misadventure at the time of the procedure: Secondary | ICD-10-CM | POA: Diagnosis present

## 2013-09-28 DIAGNOSIS — F0390 Unspecified dementia without behavioral disturbance: Secondary | ICD-10-CM | POA: Diagnosis present

## 2013-09-28 DIAGNOSIS — Z981 Arthrodesis status: Secondary | ICD-10-CM

## 2013-09-28 DIAGNOSIS — IMO0002 Reserved for concepts with insufficient information to code with codable children: Principal | ICD-10-CM | POA: Diagnosis present

## 2013-09-28 DIAGNOSIS — Z681 Body mass index (BMI) 19 or less, adult: Secondary | ICD-10-CM

## 2013-09-28 DIAGNOSIS — F329 Major depressive disorder, single episode, unspecified: Secondary | ICD-10-CM | POA: Diagnosis present

## 2013-09-28 DIAGNOSIS — F3289 Other specified depressive episodes: Secondary | ICD-10-CM | POA: Diagnosis present

## 2013-09-28 DIAGNOSIS — F17200 Nicotine dependence, unspecified, uncomplicated: Secondary | ICD-10-CM | POA: Diagnosis present

## 2013-09-28 MED ORDER — ACETAMINOPHEN 650 MG RE SUPP: 650.0000 mg | RECTAL | Status: DC | PRN

## 2013-09-28 MED ORDER — MENTHOL 3 MG MT LOZG: 1.0000 | LOZENGE | OROMUCOSAL | Status: DC | PRN

## 2013-09-28 MED ORDER — CYCLOBENZAPRINE HCL 10 MG PO TABS
10.0000 mg | ORAL_TABLET | Freq: Three times a day (TID) | ORAL | Status: DC | PRN
  Administered 2013-09-29 (×2): 10 mg via ORAL
  Filled 2013-09-28 (×7): qty 1

## 2013-09-28 MED ORDER — SODIUM CHLORIDE 0.9 % IJ SOLN
3.0000 mL | INTRAMUSCULAR | Status: DC | PRN
  Administered 2013-09-30: 3 mL via INTRAVENOUS

## 2013-09-28 MED ORDER — ALENDRONATE SODIUM 70 MG PO TABS: 70.0000 mg | ORAL_TABLET | ORAL | Status: DC

## 2013-09-28 MED ORDER — SIMVASTATIN 40 MG PO TABS
40.0000 mg | ORAL_TABLET | Freq: Every evening | ORAL | Status: DC
  Administered 2013-09-28 – 2013-10-03 (×6): 40 mg via ORAL
  Filled 2013-09-28 (×7): qty 1

## 2013-09-28 MED ORDER — SERTRALINE HCL 100 MG PO TABS
100.0000 mg | ORAL_TABLET | Freq: Every day | ORAL | Status: DC
  Administered 2013-09-29 – 2013-10-04 (×6): 100 mg via ORAL
  Filled 2013-09-28 (×7): qty 1

## 2013-09-28 MED ORDER — DEXAMETHASONE SODIUM PHOSPHATE 4 MG/ML IJ SOLN
4.0000 mg | Freq: Four times a day (QID) | INTRAMUSCULAR | Status: DC
  Filled 2013-09-28 (×4): qty 1

## 2013-09-28 MED ORDER — SODIUM CHLORIDE 0.9 % IJ SOLN
3.0000 mL | Freq: Two times a day (BID) | INTRAMUSCULAR | Status: DC
  Administered 2013-09-28 – 2013-10-04 (×12): 3 mL via INTRAVENOUS

## 2013-09-28 MED ORDER — BISACODYL 10 MG RE SUPP: 10.0000 mg | Freq: Every day | RECTAL | Status: DC | PRN

## 2013-09-28 MED ORDER — ACETAMINOPHEN 500 MG PO TABS: 1000.0000 mg | ORAL_TABLET | Freq: Two times a day (BID) | ORAL | Status: DC | PRN

## 2013-09-28 MED ORDER — TRAMADOL HCL 50 MG PO TABS
50.0000 mg | ORAL_TABLET | Freq: Three times a day (TID) | ORAL | Status: DC
  Administered 2013-09-28 – 2013-10-04 (×18): 50 mg via ORAL
  Filled 2013-09-28 (×18): qty 1

## 2013-09-28 MED ORDER — DEXAMETHASONE 4 MG PO TABS
4.0000 mg | ORAL_TABLET | Freq: Four times a day (QID) | ORAL | Status: DC
  Administered 2013-09-28 – 2013-09-29 (×3): 4 mg via ORAL
  Filled 2013-09-28 (×8): qty 1

## 2013-09-28 MED ORDER — MIRTAZAPINE 15 MG PO TABS
15.0000 mg | ORAL_TABLET | Freq: Every evening | ORAL | Status: DC | PRN
  Administered 2013-09-28: 15 mg via ORAL
  Filled 2013-09-28: qty 1

## 2013-09-28 MED ORDER — SODIUM CHLORIDE 0.9 % IV SOLN
250.0000 mL | INTRAVENOUS | Status: DC
  Administered 2013-09-28: 250 mL via INTRAVENOUS

## 2013-09-28 MED ORDER — OXYCODONE-ACETAMINOPHEN 5-325 MG PO TABS
1.0000 | ORAL_TABLET | ORAL | Status: DC | PRN
  Administered 2013-09-28 – 2013-10-03 (×13): 2 via ORAL
  Filled 2013-09-28 (×7): qty 2
  Filled 2013-09-28 (×2): qty 1
  Filled 2013-09-28 (×6): qty 2

## 2013-09-28 MED ORDER — PHENOL 1.4 % MT LIQD: 1.0000 | OROMUCOSAL | Status: DC | PRN

## 2013-09-28 MED ORDER — ONDANSETRON HCL 4 MG/2ML IJ SOLN: 4.0000 mg | INTRAMUSCULAR | Status: DC | PRN

## 2013-09-28 MED ORDER — DOCUSATE SODIUM 100 MG PO CAPS
100.0000 mg | ORAL_CAPSULE | Freq: Two times a day (BID) | ORAL | Status: DC
  Administered 2013-09-28 – 2013-10-04 (×11): 100 mg via ORAL
  Filled 2013-09-28 (×10): qty 1

## 2013-09-28 MED ORDER — HYDROMORPHONE HCL PF 1 MG/ML IJ SOLN
0.5000 mg | INTRAMUSCULAR | Status: DC | PRN
  Administered 2013-09-28 – 2013-10-02 (×7): 1 mg via INTRAVENOUS
  Filled 2013-09-28 (×7): qty 1

## 2013-09-28 MED ORDER — DONEPEZIL HCL 10 MG PO TABS
20.0000 mg | ORAL_TABLET | Freq: Every day | ORAL | Status: DC
  Administered 2013-09-29 – 2013-10-04 (×6): 20 mg via ORAL
  Filled 2013-09-28 (×7): qty 2

## 2013-09-28 MED ORDER — CYCLOBENZAPRINE HCL 10 MG PO TABS
10.0000 mg | ORAL_TABLET | Freq: Three times a day (TID) | ORAL | Status: DC | PRN
  Administered 2013-09-28 – 2013-10-01 (×5): 10 mg via ORAL

## 2013-09-28 MED ORDER — ALUM & MAG HYDROXIDE-SIMETH 200-200-20 MG/5ML PO SUSP: 30.0000 mL | Freq: Four times a day (QID) | ORAL | Status: DC | PRN

## 2013-09-28 MED ORDER — ACETAMINOPHEN 325 MG PO TABS: 650.0000 mg | ORAL_TABLET | ORAL | Status: DC | PRN

## 2013-09-28 MED ORDER — BUPROPION HCL ER (XL) 150 MG PO TB24
150.0000 mg | ORAL_TABLET | Freq: Every day | ORAL | Status: DC
  Administered 2013-09-28 – 2013-10-04 (×7): 150 mg via ORAL
  Filled 2013-09-28 (×7): qty 1

## 2013-09-28 NOTE — Progress Notes (Signed)
Pt direct adm from home, Alert and Oriented x4. IV started in LFA, oriented to unit and call bell within reach. Family at bedside. Dr. Wynetta Emery notified and in room with pt. Will continue to monitor and assess. Rodell Perna Pearlean Sabina Rn.

## 2013-09-28 NOTE — H&P (Signed)
Marcia Lynch is an 79 y.o. female.   Chief Complaint: Neck and left shoulder pain HPI: Patient is a pleasant 79 year old female who is a week and half out from a posterior cervical decompression and fusion. Initially she did very well was discharged the hospital after couple days however in the last few days is a progress worsening pain in her neck in the back of her left shoulder the point which she's been lying in bed unable to move. Changes in her arms or hands still says they feel better than it did preoperatively. However she's not been getting out of bed and go without them she does say that she's been urinating fine but is not a bowel movement. As patient was lying wishing at home and been refractory to conservative treatment oral steroids and brought the patient for pain control and workup. I do think that with a worsening pain in her neck and left shoulder it should be an MRI scan to rule out hematoma and will place her on IV steroids and her physical therapy and would continue the evaluation.  Past Medical History  Diagnosis Date  . Headache(784.0)   . Depression     TAKING ZOLOFT FOR A LONG TIME  . Dementia     STARTING SIGNS OF DEMENTIA    Past Surgical History  Procedure Laterality Date  . Fracture surgery      RIGHT HIP-HAS SCREW  . Rotator cuff repair      RIGHT SHOULDER  . Shoulder arthroscopy      ? LEFT BONE SPURSS  . Carpal tunnel release      RIGHT  . Eye surgery      BILATERAL CATARACTS  . Posterior cervical fusion/foraminotomy N/A 09/20/2013    Procedure: CERVICAL THREE TO CERVICAL SEVEN  POSTERIOR CERVICAL FUSION;  Surgeon: Mariam Dollar, MD;  Location: [facility];  Service: Neurosurgery;  Laterality: N/A;  CERVICAL THREE TO CERVICAL SEVEN  POSTERIOR CERVICAL FUSION    No family history on file. Social History:  reports that she has been smoking Cigarettes.  She has a 17 pack-year smoking history. She does not have any smokeless tobacco history on file. She  reports that she does not drink alcohol or use illicit drugs.  Allergies:  Allergies  Allergen Reactions  . Cymbalta [Duloxetine Hcl] Rash    Medications Prior to Admission  Medication Sig Dispense Refill  . acetaminophen (TYLENOL) 500 MG tablet Take 1,000 mg by mouth 2 (two) times daily as needed for pain.      Marland Kitchen alendronate (FOSAMAX) 70 MG tablet Take 70 mg by mouth every 7 (seven) days. Take with a full glass of water on an empty stomach.  On Sunday      . buPROPion (WELLBUTRIN XL) 150 MG 24 hr tablet Take 150 mg by mouth daily.      . cyclobenzaprine (FLEXERIL) 10 MG tablet Take 1 tablet (10 mg total) by mouth 3 (three) times daily as needed for muscle spasms.  80 tablet  1  . donepezil (ARICEPT) 10 MG tablet Take 20 mg by mouth daily.      . mirtazapine (REMERON) 15 MG tablet Take 15 mg by mouth at bedtime as needed (restless legs).      . Multiple Vitamins-Minerals (MULTIVITAMIN PO) Take 1 tablet by mouth daily.      Marland Kitchen oxyCODONE-acetaminophen (PERCOCET/ROXICET) 5-325 MG per tablet Take 1-2 tablets by mouth every 4 (four) hours as needed.  80 tablet  0  . sertraline (  ZOLOFT) 100 MG tablet Take 100 mg by mouth daily.      . simvastatin (ZOCOR) 40 MG tablet Take 40 mg by mouth every evening.      . traMADol (ULTRAM) 50 MG tablet Take 50 mg by mouth 3 (three) times daily.        No results found for this or any previous visit (from the past 48 hour(s)). No results found.  Review of Systems  Constitutional: Negative.   HENT: Positive for neck pain.   Eyes: Negative.   Respiratory: Negative.   Cardiovascular: Negative.   Gastrointestinal: Negative.   Genitourinary: Negative.   Musculoskeletal: Positive for joint pain.  Skin: Negative.   Neurological: Positive for headaches.  Endo/Heme/Allergies: Negative.   Psychiatric/Behavioral: Negative.     There were no vitals taken for this visit. Physical Exam  Constitutional: She is oriented to person, place, and time. She appears  well-developed and well-nourished.  HENT:  Head: Normocephalic.  Eyes: Pupils are equal, round, and reactive to light.  Neck: Normal range of motion.  Cardiovascular: Normal rate.   Respiratory: Effort normal.  GI: Soft.  Neurological: She is alert and oriented to person, place, and time. She has normal strength. GCS eye subscore is 4. GCS verbal subscore is 5. GCS motor subscore is 6.  Reflex Scores:      Tricep reflexes are 2+ on the right side and 2+ on the left side.      Bicep reflexes are 2+ on the right side and 2+ on the left side.      Brachioradialis reflexes are 2+ on the right side and 2+ on the left side.      Patellar reflexes are 2+ on the right side and 2+ on the left side.      Achilles reflexes are 2+ on the right side and 2+ on the left side. Strength is 5 out of 5 in her deltoids biceps triceps wrist flexion wrist extension and intrinsics. Lower exam he strength is also 5 out of 5  Skin: Skin is warm and dry.     Assessment/Plan 79 year old female presents for workup of intractable pain status post posterior cervical decompression and fusion.  Marcia Lynch P 09/28/2013, 1:11 PM

## 2013-09-28 NOTE — Evaluation (Signed)
Physical Therapy Evaluation Patient Details Name: Marcia Lynch MRN: [id number] DOB: [birth year] Today's Date: 09/28/2013 Time: 4098-1191 PT Time Calculation (min): 16 min  PT Assessment / Plan / Recommendation History of Present Illness  Patient is a pleasant 79 year old female who is a week and half out from a posterior cervical decompression and fusion. Initially she did very well was discharged the hospital after couple days however in the last few days is a progress worsening pain in her neck in the back of her left shoulder the point which she's been lying in bed unable to move. Changes in her arms or hands still says they feel better than it did preoperatively. However she's not been getting out of bed and go without them she does say that she's been urinating fine but is not a bowel movement. As patient was lying wishing at home and been refractory to conservative treatment oral steroids and brought the patient for pain control and workup.  Clinical Impression  Patient demonstrates significantly limited mobility and activity tolerance secondary to intractable cervical and LUE pain. Pain focused around left upper trap. Muscle group. Patient able to perform log roll in bed, but when attempting to reach seated position patient unable to tolerate full upright and begins to feel catching sharp pain with severe spasm approximately 30 degrees into upright motion. Performed neural tension testing supine, patient limited to 1/5 positioning, reports limiting factor is pain but not numbness or paresthesias.  Patient has not been out of bed in nearly a week, concerned for generalized weakness and deconditioning. Will continue to see to evaluate mobility further as tolerated. Will follow and update assessment as able.    PT Assessment  Patient needs continued PT services    Follow Up Recommendations   (TBD)          Equipment Recommendations   (TBD)       Frequency Min 3X/week    Precautions /  Restrictions Precautions Precautions: Cervical Restrictions Weight Bearing Restrictions: No   Pertinent Vitals/Pain 10/10 pain      Mobility  Bed Mobility Bed Mobility: Rolling Right;Rolling Left;Left Sidelying to Sit Rolling Right: 6: Modified independent (Device/Increase time) Rolling Left: 6: Modified independent (Device/Increase time) Left Sidelying to Sit:  (attempted but unable to complete due to intractible pain) Details for Bed Mobility Assistance: significantly increased time to perform Transfers Transfers: Not assessed        PT Diagnosis: Acute pain  PT Problem List: Decreased activity tolerance;Pain    PT Goals(Current goals can be found in the care plan section) Acute Rehab PT Goals Patient Stated Goal: to not be in pain PT Goal Formulation: With patient Time For Goal Achievement: 10/12/13 Potential to Achieve Goals: Good  Visit Information  Last PT Received On: 09/28/13 Assistance Needed: +1 History of Present Illness: Patient is a pleasant 79 year old female who is a week and half out from a posterior cervical decompression and fusion. Initially she did very well was discharged the hospital after couple days however in the last few days is a progress worsening pain in her neck in the back of her left shoulder the point which she's been lying in bed unable to move. Changes in her arms or hands still says they feel better than it did preoperatively. However she's not been getting out of bed and go without them she does say that she's been urinating fine but is not a bowel movement. As patient was lying wishing at home and been refractory to conservative treatment oral  steroids and brought the patient for pain control and workup.       Prior Functioning  Home Living Family/patient expects to be discharged to:: Private residence Living Arrangements: Non-relatives/Friends Available Help at Discharge: Available PRN/intermittently Type of Home: House Home Access:  Stairs to enter [employer] of Steps: 3 Entrance Stairs-Rails: Left Home Layout: One level Home Equipment: None Additional Comments: pt rents a room from a friend.   Prior Function Level of Independence: Independent Communication Communication: No difficulties Dominant Hand: Right    Cognition  Cognition Arousal/Alertness: Awake/alert Behavior During Therapy: WFL for tasks assessed/performed Overall Cognitive Status: Within Functional Limits for tasks assessed    Extremity/Trunk Assessment Upper Extremity Assessment Upper Extremity Assessment: LUE deficits/detail LUE Deficits / Details: intractible LUE pain upper trap, catching sensation LUE: Unable to fully assess due to pain LUE Sensation:  (no numbness or parasthesias reported, pain with catching)      End of Session PT - End of Session Patient left: in bed;with call bell/phone within reach;with family/visitor present Nurse Communication: Other (comment) (limited evaluation 2/2 intractible pain)  GP Functional Assessment Tool Used: clinical judgement Functional Limitation: Changing and maintaining body position Changing and Maintaining Body Position Current Status (Z6109): At least 40 percent but less than 60 percent impaired, limited or restricted Changing and Maintaining Body Position Goal Status (U0454): At least 1 percent but less than 20 percent impaired, limited or restricted   Fabio Asa 09/28/2013, 3:33 PM Charlotte Crumb, PT DPT  [phone]

## 2013-09-28 NOTE — Evaluation (Addendum)
Occupational Therapy Evaluation Patient Details Name: Marcia Lynch MRN: [id number] DOB: Jan 17, 1931 Today's Date: 09/28/2013 Time: 8295-6213 OT Time Calculation (min): 7 min  OT Assessment / Plan / Recommendation History of present illness Patient is a pleasant 79 year old female who is a week and half out from a posterior cervical decompression and fusion. Initially she did very well was discharged the hospital after couple days however in the last few days is a progress worsening pain in her neck in the back of her left shoulder the point which she's been lying in bed unable to move. Changes in her arms or hands still says they feel better than it did preoperatively. However she's not been getting out of bed and go without them she does say that she's been urinating fine but is not a bowel movement. As patient was lying wishing at home and been refractory to conservative treatment oral steroids and brought the patient for pain control and workup.   Clinical Impression   Limited evaluation due to pain. Pt demonstrates significantly limited mobility due to cervical and pain in LUE (near shoulder/neck area). Pt performed bed mobility and did manage to go from sidelying to sitting position with Mod A but went back down abruptly due to excruciating pain. Pt will benefit from acute OT to increase independence prior to d/c.     OT Assessment  Patient needs continued OT Services    Follow Up Recommendations  Other (comment) (TBD)    Barriers to Discharge      Equipment Recommendations  Other (comment) (tbd)    Recommendations for Other Services    Frequency  Min 2X/week    Precautions / Restrictions Precautions Precautions: Cervical Required Braces or Orthoses: Other Brace/Splint (soft collar) Restrictions Weight Bearing Restrictions: No   Pertinent Vitals/Pain Severe pain in cervical/LUE area. Repositioned.     ADL  Grooming: Maximal assistance Where Assessed - Grooming: Supine,  head of bed up Upper Body Bathing: +1 Total assistance Where Assessed - Upper Body Bathing: Supine, head of bed flat;Supine, head of bed up;Rolling right and/or left Lower Body Bathing: +1 Total assistance Where Assessed - Lower Body Bathing: Supine, head of bed up;Supine, head of bed flat;Rolling right and/or left Upper Body Dressing: +1 Total assistance Where Assessed - Upper Body Dressing: Supine, head of bed up;Supine, head of bed flat;Rolling right and/or left Lower Body Dressing: +1 Total assistance Where Assessed - Lower Body Dressing: Supine, head of bed up;Supine, head of bed flat;Rolling right and/or left Toileting - Clothing Manipulation and Hygiene: +1 Total assistance Where Assessed - Toileting Clothing Manipulation and Hygiene: Supine, head of bed flat;Rolling right and/or left;Sit on 3-in-1 or toilet Tub/Shower Transfer Method: Not assessed Equipment Used: Other (comment) (soft cervical collar) ADL Comments: At this time, pt Total A for ADLs due to pain.  Stepped back in later, and gave cues for precautions.     OT Diagnosis: Acute pain  OT Problem List: Pain;Impaired UE functional use;Decreased knowledge of use of DME or AE;Decreased knowledge of precautions;Decreased activity tolerance;Decreased range of motion OT Treatment Interventions: Therapeutic exercise;Self-care/ADL training;DME and/or AE instruction;Therapeutic activities;Patient/family education;Balance training   OT Goals(Current goals can be found in the care plan section) Acute Rehab OT Goals Patient Stated Goal: not stated OT Goal Formulation: With patient Time For Goal Achievement: 10/05/13 Potential to Achieve Goals: Good ADL Goals Pt Will Perform Grooming: with modified independence;standing Pt Will Perform Upper Body Bathing: with modified independence;sitting Pt Will Perform Lower Body Bathing: with modified independence;with adaptive equipment;sit  to/from stand Pt Will Perform Upper Body Dressing: with  modified independence;sitting Pt Will Perform Lower Body Dressing: with modified independence;with adaptive equipment;sit to/from stand Pt Will Transfer to Toilet: with modified independence;ambulating;bedside commode Pt Will Perform Toileting - Clothing Manipulation and hygiene: with modified independence;sit to/from stand Additional ADL Goal #1: Pt will independently verbalize and demonstrate 3/3 cervical precautions.   Visit Information  Last OT Received On: 09/28/13 Assistance Needed: +1 History of Present Illness: Patient is a pleasant 79 year old female who is a week and half out from a posterior cervical decompression and fusion. Initially she did very well was discharged the hospital after couple days however in the last few days is a progress worsening pain in her neck in the back of her left shoulder the point which she's been lying in bed unable to move. Changes in her arms or hands still says they feel better than it did preoperatively. However she's not been getting out of bed and go without them she does say that she's been urinating fine but is not a bowel movement. As patient was lying wishing at home and been refractory to conservative treatment oral steroids and brought the patient for pain control and workup.       Prior Functioning     Home Living Family/patient expects to be discharged to:: Private residence Living Arrangements: Non-relatives/Friends Available Help at Discharge: Available PRN/intermittently Type of Home: House Home Access: Stairs to enter [employer] of Steps: 3 Entrance Stairs-Rails: Left Home Layout: One level Home Equipment: None Additional Comments: pt rents a room from a friend.   Prior Function Level of Independence: Independent Communication Communication: No difficulties Dominant Hand: Right         Vision/Perception     Cognition  Cognition Arousal/Alertness: Awake/alert Behavior During Therapy: WFL for tasks  assessed/performed Overall Cognitive Status: Within Functional Limits for tasks assessed    Extremity/Trunk Assessment Upper Extremity Assessment Upper Extremity Assessment: LUE deficits/detail;RUE deficits/detail RUE Deficits / Details: very limited AROM shoulder flexion-may be due to pain? Reports past shoulder surgery LUE Deficits / Details: very limited AROM shoulder flexion-may be due to pain? Reports past shoulder surgery LUE Sensation:  (no numbness or parasthesias reported, pain with catching) Lower Extremity Assessment Lower Extremity Assessment: Defer to PT evaluation     Mobility Bed Mobility Bed Mobility: Rolling Right;Rolling Left;Right Sidelying to Sit;Sit to Sidelying Right Rolling Right: 5: Supervision Rolling Left: 6: Modified independent (Device/Increase time) Right Sidelying to Sit: 3: Mod assist Sit to Sidelying Right: 4: Min assist/Min guard (went back down abruptly) Details for Bed Mobility Assistance: pt with excruciating pain in left shoulder/neck area. Went from sidelying to sitting position 2x's and pt appeared to be jerking back and laid abruptly back down due to pain. Mod A to bring trunk to sitting position. Transfers Transfers: Not assessed     Exercise     Balance     End of Session OT - End of Session Equipment Utilized During Treatment: Other (comment) (cervical collar) Activity Tolerance: Patient limited by pain Patient left: in bed;with family/visitor present Nurse Communication: Other (comment) (asked about patient )    GO     Functional Assessment Tool Used: clinical judgement  Functional Limitation: Self care Self Care Current Status (Z6109): At least 80 percent but less than 100 percent impaired, limited or restricted  Self Care Goal Status (U0454): At least 1 percent but less than 20 percent impaired, limited or restricted    Earlie Raveling OTR/L [phone] 09/28/2013,  5:59 PM

## 2013-09-28 NOTE — Progress Notes (Signed)
MD on call notified for MRI result, no new orders noted. Will continue to monitor pt.

## 2013-09-29 MED ORDER — DEXAMETHASONE SODIUM PHOSPHATE 10 MG/ML IJ SOLN
10.0000 mg | Freq: Four times a day (QID) | INTRAMUSCULAR | Status: AC
  Administered 2013-09-29 (×2): 10 mg via INTRAVENOUS
  Filled 2013-09-29 (×2): qty 1

## 2013-09-29 MED ORDER — METHOCARBAMOL 100 MG/ML IJ SOLN: 500.0000 mg | Freq: Four times a day (QID) | INTRAVENOUS | Status: DC | PRN

## 2013-09-29 NOTE — Progress Notes (Signed)
Subjective: Patient reports overall she's noted no change in her neck pain and the back of her left shoulder but she denies any pain that goes down her arms she denies any numbness and tingling her hands denies any weakness that she's noticed in her arms hands or legs. She is able to ambulate if the pain was under better control in her neck she does not feel like her legs gave way on her at all.  Objective: Vital signs in last 24 hours: Temp:  [98.1 F (36.7 C)-98.5 F (36.9 C)] 98.1 F (36.7 C) (10/08 0626) Pulse Rate:  [67-78] 78 (10/08 0626) Resp:  [18-19] 18 (10/08 0626) BP: (120-161)/(56-84) 129/56 mmHg (10/08 0626) SpO2:  [92 %-96 %] 96 % (10/08 0626) Weight:  [48.898 kg (107 lb 12.8 oz)] 48.898 kg (107 lb 12.8 oz) (10/07 1425)  Intake/Output from previous day: 10/07 0701 - 10/08 0700 In: 300 [P.O.:300] Out: -  Intake/Output this shift:    awake alert oriented strength 5 out of 5 in her upper and lower extremities bandage is flat and dry  Lab Results: No results found for this basename: WBC, HGB, HCT, PLT,  in the last 72 hours BMET No results found for this basename: NA, K, CL, CO2, GLUCOSE, BUN, CREATININE, CALCIUM,  in the last 72 hours  Studies/Results: Mr Cervical Spine Wo Contrast  09/28/2013   CLINICAL DATA:  One week post fusion. Severe pain back of neck and upper back.  EXAM: MRI CERVICAL SPINE WITHOUT CONTRAST  TECHNIQUE: Multiplanar, multisequence MR imaging was performed. No intravenous contrast was administered.  COMPARISON:  Intraoperative C-arm view 09/20/2013. Preoperative MR 08/12/2013.  FINDINGS: Exam is motion degraded.  Decompressive laminectomy C3-C7 with posterior fusion. In the operative site, there is a large fluid collection extending from the C2-C7 (7.2 cm) and impressing upon the posterior aspect of the cord.  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  Presently, no abnormal signal  within the compressed cord.  Fluid collection may represent postoperative seroma. Postoperative hemorrhage, infection or a CSF leak cannot be excluded.  Both vertebral arteries are patent.  IMPRESSION: Decompressive laminectomy C3-C7 with posterior fusion. In the operative site, there is a large fluid collection extending from the C2-C7 (7.2 cm) and impressing upon the posterior aspect of the cord.  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  Presently, no abnormal signal within the compressed cord.  These results were called by telephone at the time of interpretation on 09/28/2013 at 7:40 PM to [facility] patients nurse, who verbally acknowledged these results. Request to call neurosurgeon on-call with results.   Electronically Signed   By: Bridgett Larsson M.D.   On: 09/28/2013 19:41    Assessment/Plan: Hospital day 1 for evaluation of worsening posterior cervical and neck and shoulder pain no change yet on the IV Decadron MRI scan does show postoperative fluid collection with some mild distortion of the posterior aspect of her cord. I think this still represents a improvement for preoperative stenosis. She has no signs of spinal cord compression clinically no signs of myelopathy so I extensively explained the options to Ms Hedstrom including observation with adjustments were medications, needle aspiration of the fluid collection, or reexploration drainage. She is reluctant to 100 to a more general anesthesia and surgery which is certainly understand nor do I feel that we have to do that because she has no symptoms of spinal  cord compression. I will make some adjustments or medication CL she does throughout the morning we will consider insertion of a needle for drainage of some of the fluid to alleviate some of the pressure and see if she has symptomatic improvement.   LOS: 1 day     Jace Fermin P 09/29/2013, 7:26 AM

## 2013-09-29 NOTE — Progress Notes (Signed)
Physical Therapy Treatment Patient Details Name: Marcia Lynch MRN: [id number] DOB: Dec 05, 1931 Today's Date: 09/29/2013 Time: 4098-1191 PT Time Calculation (min): 23 min  PT Assessment / Plan / Recommendation  History of Present Illness Patient is a pleasant 79 year old female who is a week and half out from a posterior cervical decompression and fusion. Initially she did very well was discharged the hospital after couple days however in the last few days is a progress worsening pain in her neck in the back of her left shoulder the point which she's been lying in bed unable to move. Changes in her arms or hands still says they feel better than it did preoperatively. However she's not been getting out of bed and go without them she does say that she's been urinating fine but is not a bowel movement. As patient was lying wishing at home and been refractory to conservative treatment oral steroids and brought the patient for pain control and workup.   PT Comments   Patient demonstrates improvements in activity tolerance today, but shows poor balance when ambulating without assistive device. Anticipate that this will improve quickly as patient starts to perform more mobility (pt has been non-mobile for past week). Patient does not have assist/supervision as patient rents a room from a friend who is not always there. Anticipate that as patient continues to ambulate, balance will improve.  Encouraged patient to ambulate with nursing multiple times throughout the day and this evening. Will continue to see patient and progress mobility as tolerated with hopes of reaching independence within the next day.     Follow Up Recommendations  Supervision for mobility/OOB        Barriers to Discharge  Patient does not have anyone at home with her during the day, she rents a room from a friend that checks in on her but is not always there for assist. Will need to be independent prior to dc home.       Equipment  Recommendations  None recommended by PT       Frequency Min 3X/week   Progress towards PT Goals Progress towards PT goals: Progressing toward goals  Plan Current plan remains appropriate    Precautions / Restrictions Precautions Precautions: Cervical Required Braces or Orthoses: Other Brace/Splint (soft collar) Restrictions Weight Bearing Restrictions: No   Pertinent Vitals/Pain 8/10 pain    Mobility  Bed Mobility Bed Mobility: Rolling Left;Left Sidelying to Sit;Sitting - Scoot to [place] of Bed;Sit to Supine Rolling Left: 5: Supervision Left Sidelying to Sit: 5: Supervision Sitting - Scoot to Edge of Bed: 6: Modified independent (Device/Increase time) Sit to Supine: 5: Supervision Details for Bed Mobility Assistance: VCs for positioning during mobility Transfers Transfers: Sit to Stand;Stand to Sit Sit to Stand: 5: Supervision Stand to Sit: 5: Supervision Ambulation/Gait Ambulation/Gait Assistance: 4: Min guard;4: Min assist Ambulation Distance (Feet): 500 Feet Assistive device: Rolling walker;None Ambulation/Gait Assistance Details: min assist for stability with several noted balance checks when ambulating without assistive device, improved when using rw Gait Pattern: Step-through pattern;Decreased stride length Gait velocity: decreased General Gait Details: guarded and cautious with noted instability during ambulation Stairs: Yes Stairs Assistance: 4: Min guard Stair Management Technique: One rail Left;Forwards Number of Stairs: 5      PT Goals (current goals can now be found in the care plan section) Acute Rehab PT Goals Patient Stated Goal: to go home with friend PT Goal Formulation: With patient Time For Goal Achievement: 10/12/13 Potential to Achieve Goals: Good  Visit Information  Last PT Received On: 09/29/13 Assistance Needed: +1 History of Present Illness: Patient is a pleasant 79 year old female who is a week and half out from a posterior cervical  decompression and fusion. Initially she did very well was discharged the hospital after couple days however in the last few days is a progress worsening pain in her neck in the back of her left shoulder the point which she's been lying in bed unable to move. Changes in her arms or hands still says they feel better than it did preoperatively. However she's not been getting out of bed and go without them she does say that she's been urinating fine but is not a bowel movement. As patient was lying wishing at home and been refractory to conservative treatment oral steroids and brought the patient for pain control and workup.    Subjective Data  Subjective: i feel much better today Patient Stated Goal: to go home with friend   Cognition  Cognition Arousal/Alertness: Awake/alert Behavior During Therapy: WFL for tasks assessed/performed Overall Cognitive Status: Within Functional Limits for tasks assessed    Balance  Balance Balance Assessed: Yes Static Sitting Balance Static Sitting - Balance Support: Feet supported Static Sitting - Level of Assistance: 7: Independent Static Standing Balance Static Standing - Balance Support: No upper extremity supported;During functional activity Static Standing - Level of Assistance: 5: Stand by assistance Static Standing - Comment/# of Minutes: some instability noted  End of Session PT - End of Session Equipment Utilized During Treatment: Gait belt;Cervical collar Activity Tolerance: Patient tolerated treatment well Patient left: in bed;with call bell/phone within reach Nurse Communication: Mobility status   GP     Fabio Asa 09/29/2013, 2:01 PM Charlotte Crumb, PT DPT  [phone]

## 2013-09-29 NOTE — Progress Notes (Signed)
Occupational Therapy Treatment Patient Details Name: Marcia Lynch MRN: [id number] DOB: Mar 26, 1931 Today's Date: 09/29/2013 Time: 0865-7846 OT Time Calculation (min): 34 min  OT Assessment / Plan / Recommendation  History of present illness Patient is a pleasant 79 year old female who is a week and half out from a posterior cervical decompression and fusion. Initially she did very well was discharged the hospital after couple days however in the last few days is a progress worsening pain in her neck in the back of her left shoulder the point which she's been lying in bed unable to move. Changes in her arms or hands still says they feel better than it did preoperatively. However she's not been getting out of bed and go without them she does say that she's been urinating fine but is not a bowel movement. As patient was lying wishing at home and been refractory to conservative treatment oral steroids and brought the patient for pain control and workup.   OT comments  Pt with much improvement today. Practiced simulated tub transfer and with AE for LB dressing. Several cues for precautions during session. Will benefit for reinforcement of precautions next session and practicing tub again as well as LB ADLs.   Follow Up Recommendations  Supervision - Intermittent    Barriers to Discharge       Equipment Recommendations  Other (comment) (tbd-determine if pt needs shower chair)    Recommendations for Other Services    Frequency Min 2X/week   Progress towards OT Goals Progress towards OT goals: Progressing toward goals  Plan Discharge plan needs to be updated    Precautions / Restrictions Precautions Precautions: Cervical Required Braces or Orthoses: Other Brace/Splint (soft collar) Restrictions Weight Bearing Restrictions: No   Pertinent Vitals/Pain Pain in cervical area. Nurse notified. Repositioned.     ADL  Lower Body Dressing: Set up;Supervision/safety Where Assessed - Lower Body  Dressing: Unsupported sitting Toilet Transfer: Supervision/safety Toilet Transfer Method: Sit to stand Toilet Transfer Equipment: Comfort height toilet;Regular height toilet;Grab bars Toileting - Clothing Manipulation and Hygiene: Supervision/safety;Set up Where Assessed - Toileting Clothing Manipulation and Hygiene: Sit to stand from 3-in-1 or toilet Tub/Shower Transfer: Simulated;Minimal assistance;Min guard Tub/Shower Transfer Method: Science writer: Other (comment) (practiced stepping over) Equipment Used: Rolling walker;Sock aid;Reacher;Long-handled sponge Transfers/Ambulation Related to ADLs: Min A/ Min guard for ambulation. LOB in hallway-Min A. Supervision for transfers. ADL Comments: Practiced with AE for LB dressing as pt felt as she was straining when donning/doffing socks. Cues for precautions during hygiene and throughout session. Tried walking with and without walker. Practiced simulated tub transfer- Min A for balance at times.     OT Diagnosis:    OT Problem List:   OT Treatment Interventions:     OT Goals(current goals can now be found in the care plan section) Acute Rehab OT Goals Patient Stated Goal: not stated OT Goal Formulation: With patient Time For Goal Achievement: 10/05/13 Potential to Achieve Goals: Good ADL Goals Pt Will Perform Grooming: with modified independence;standing Pt Will Perform Upper Body Bathing: with modified independence;sitting Pt Will Perform Lower Body Bathing: with modified independence;with adaptive equipment;sit to/from stand Pt Will Perform Upper Body Dressing: with modified independence;sitting Pt Will Perform Lower Body Dressing: with modified independence;with adaptive equipment;sit to/from stand Pt Will Transfer to Toilet: with modified independence;ambulating;bedside commode Pt Will Perform Toileting - Clothing Manipulation and hygiene: with modified independence;sit to/from stand Additional ADL Goal #1:  Pt will independently verbalize and demonstrate 3/3 cervical precautions.   Visit Information  Last OT Received On: 09/29/13 Assistance Needed: +1 History of Present Illness: Patient is a pleasant 79 year old female who is a week and half out from a posterior cervical decompression and fusion. Initially she did very well was discharged the hospital after couple days however in the last few days is a progress worsening pain in her neck in the back of her left shoulder the point which she's been lying in bed unable to move. Changes in her arms or hands still says they feel better than it did preoperatively. However she's not been getting out of bed and go without them she does say that she's been urinating fine but is not a bowel movement. As patient was lying wishing at home and been refractory to conservative treatment oral steroids and brought the patient for pain control and workup.    Subjective Data      Prior Functioning       Cognition  Cognition Arousal/Alertness: Awake/alert Behavior During Therapy: WFL for tasks assessed/performed Overall Cognitive Status: Within Functional Limits for tasks assessed    Mobility  Bed Mobility Bed Mobility: Rolling Right;Right Sidelying to Sit;Rolling Left;Sit to Sidelying Right;Scooting to [facility] Rolling Right: 5: Supervision Rolling Left: 5: Supervision Right Sidelying to Sit: 5: Supervision Sit to Sidelying Right: 5: Supervision Scooting to [facility]: 5: Supervision Details for Bed Mobility Assistance: Cues for technique as pt was performing more of a supine <> sit technique.  Transfers Transfers: Sit to Stand;Stand to Sit Sit to Stand: 5: Supervision;With upper extremity assist;From toilet;From chair/3-in-1 Stand to Sit: 5: Supervision;With upper extremity assist;To toilet;To chair/3-in-1;To bed Details for Transfer Assistance: cues for technique and hand placement.    Exercises         End of Session OT - End of Session Equipment Utilized  During Treatment: Rolling walker;Cervical collar Activity Tolerance: Patient tolerated treatment well Patient left: in bed;with call bell/phone within reach;with bed alarm set Nurse Communication: Mobility status  GO     Earlie Raveling OTR/L [phone] 09/29/2013, 3:39 PM

## 2013-09-30 NOTE — Progress Notes (Signed)
   CARE MANAGEMENT NOTE 09/30/2013  Patient:  ELOINA, ERGLE   Account Number:  [id number]  Date Initiated:  09/30/2013  Documentation initiated by:  Jiles Crocker  Subjective/Objective Assessment:   ADMITTED FOR POST CERVICAL FUSION, INTRACTABLE PAIN     Action/Plan:   PCP IS DR Doreatha Martin  LIVES AT HOME; CM FOLLOWING FOR DCP   Anticipated DC Date:  10/11/2013   Anticipated DC Plan:  POSSIBLY HOME W HOME HEALTH SERVICES    DC Planning Services  CM consult          Status of service:  In process, will continue to follow Medicare Important Message given?  NA - LOS <3 / Initial given by admissions (If response is "NO", the following Medicare IM given date fields will be blank)  Per UR Regulation:  Reviewed for med. necessity/level of care/duration of stay  Comments:  09/30/2013- B Rafaela Dinius RN,BSN,MHA

## 2013-09-30 NOTE — Progress Notes (Signed)
Occupational Therapy Treatment Patient Details Name: Marcia Lynch MRN: [id number] DOB: 07-Sep-1931 Today's Date: 09/30/2013 Time: 4098-1191 OT Time Calculation (min): 14 min  OT Assessment / Plan / Recommendation  History of present illness Patient is a pleasant 79 year old female who is a week and half out from a posterior cervical decompression and fusion. Initially she did very well was discharged the hospital after couple days however in the last few days is a progress worsening pain in her neck in the back of her left shoulder the point which she's been lying in bed unable to move. Changes in her arms or hands still says they feel better than it did preoperatively. However she's not been getting out of bed and go without them she does say that she's been urinating fine but is not a bowel movement. As patient was lying wishing at home and been refractory to conservative treatment oral steroids and brought the patient for pain control and workup.   OT comments  Pt limited by pain this morning. Required min guard for safety due to becoming less steady with increased gait speed when ambulating to/from bathroom (tries to hurry because of her pain). Will continue to follow acutely.  Follow Up Recommendations  Supervision - Intermittent    Barriers to Discharge       Equipment Recommendations  Tub/shower seat    Recommendations for Other Services    Frequency Min 2X/week   Progress towards OT Goals Progress towards OT goals: Progressing toward goals  Plan Discharge plan needs to be updated    Precautions / Restrictions Precautions Precautions: Cervical Required Braces or Orthoses:  (soft collar) Restrictions Weight Bearing Restrictions: No   Pertinent Vitals/Pain See vitals    ADL  Grooming: Performed;Min guard Where Assessed - Grooming: Unsupported standing Toilet Transfer: Performed;Min guard Toilet Transfer Method: Sit to Barista: Comfort height  toilet;Grab bars Toileting - Clothing Manipulation and Hygiene: Performed;Min guard Where Assessed - Engineer, mining and Hygiene: Sit to stand from 3-in-1 or toilet Equipment Used: Rolling walker Transfers/Ambulation Related to ADLs: Min guard with RW.  Pt increase gait speed as she becomes more painful, trying to finish and get back to bed as soon as she can. ADL Comments: Pt requiring min guard for toileting tasks today due to increased speed when ambulating. Pt states she tries to "hurry up and finish" due to increased neck pain. Educated pt on slowing down and taking her time, that increasing her speed when walking only makes her more unsteady and increases risk of fall.  Pt declining practicing tub transfer this AM due to pain.    OT Diagnosis:    OT Problem List:   OT Treatment Interventions:     OT Goals(current goals can now be found in the care plan section) Acute Rehab OT Goals Patient Stated Goal: to go home with friend OT Goal Formulation: With patient Time For Goal Achievement: 10/05/13 Potential to Achieve Goals: Good ADL Goals Pt Will Perform Grooming: with modified independence;standing Pt Will Perform Upper Body Bathing: with modified independence;sitting Pt Will Perform Lower Body Bathing: with modified independence;with adaptive equipment;sit to/from stand Pt Will Perform Upper Body Dressing: with modified independence;sitting Pt Will Perform Lower Body Dressing: with modified independence;with adaptive equipment;sit to/from stand Pt Will Transfer to Toilet: with modified independence;ambulating;bedside commode Pt Will Perform Toileting - Clothing Manipulation and hygiene: with modified independence;sit to/from stand Additional ADL Goal #1: Pt will independently verbalize and demonstrate 3/3 cervical precautions.   Visit Information  Last OT Received On: 09/30/13 Assistance Needed: +1 History of Present Illness: Patient is a pleasant 79 year old female  who is a week and half out from a posterior cervical decompression and fusion. Initially she did very well was discharged the hospital after couple days however in the last few days is a progress worsening pain in her neck in the back of her left shoulder the point which she's been lying in bed unable to move. Changes in her arms or hands still says they feel better than it did preoperatively. However she's not been getting out of bed and go without them she does say that she's been urinating fine but is not a bowel movement. As patient was lying wishing at home and been refractory to conservative treatment oral steroids and brought the patient for pain control and workup.    Subjective Data      Prior Functioning       Cognition  Cognition Arousal/Alertness: Awake/alert Behavior During Therapy: WFL for tasks assessed/performed Overall Cognitive Status: Within Functional Limits for tasks assessed    Mobility  Bed Mobility Bed Mobility: Rolling Right;Right Sidelying to Sit;Sitting - Scoot to [place] of Bed;Sit to Sidelying Right Rolling Right: 5: Supervision Rolling Left: 5: Supervision Right Sidelying to Sit: 4: Min assist Left Sidelying to Sit: 5: Supervision Sitting - Scoot to Edge of Bed: 6: Modified independent (Device/Increase time) Sit to Supine: 5: Supervision Sit to Sidelying Right: 5: Supervision Details for Bed Mobility Assistance: Assist needed to elevate trunk OOB. Transfers Transfers: Sit to Stand;Stand to Sit Sit to Stand: 4: Min guard;From bed;From toilet Stand to Sit: 4: Min guard;To bed;To toilet Details for Transfer Assistance: Guarding for safety    Exercises      Balance Balance Balance Assessed: Yes Static Sitting Balance Static Sitting - Balance Support: Feet supported Static Sitting - Level of Assistance: 7: Independent Static Standing Balance Static Standing - Balance Support: No upper extremity supported;During functional activity Static Standing - Level  of Assistance: 4: Min assist Static Standing - Comment/# of Minutes: some instability during active muscle spasms   End of Session OT - End of Session Equipment Utilized During Treatment: Rolling walker;Cervical collar Activity Tolerance: Patient limited by pain Patient left: in bed;with call bell/phone within reach Nurse Communication: Patient requests pain meds  GO   09/30/2013 Cipriano Mile OTR/L Pager [phone] Office [phone]   Cipriano Mile 09/30/2013, 12:55 PM

## 2013-09-30 NOTE — Progress Notes (Signed)
Physical Therapy Treatment Patient Details Name: Marcia Lynch MRN: [id number] DOB: [birth year] Today's Date: 09/30/2013 Time: 4098-1191 PT Time Calculation (min): 11 min  PT Assessment / Plan / Recommendation  History of Present Illness Patient is a pleasant 79 year old female who is a week and half out from a posterior cervical decompression and fusion. Initially she did very well was discharged the hospital after couple days however in the last few days is a progress worsening pain in her neck in the back of her left shoulder the point which she's been lying in bed unable to move. Changes in her arms or hands still says they feel better than it did preoperatively. However she's not been getting out of bed and go without them she does say that she's been urinating fine but is not a bowel movement. As patient was lying wishing at home and been refractory to conservative treatment oral steroids and brought the patient for pain control and workup.   PT Comments   Patient in significantly more pain this am then she had been yesterday. States that the spasms are coming back and 10/10 horrible pain. Patient able to ambulate but is severly limited by the pain often causing her to wrench up and lose balance. Patient could not tolerated increased activity, returned to bed and she positioned herself in what she reports is the only position of comfort. Will try to see patient this afternoon to continue to progress ambulation and activity as tolerated due to limited session this am.   Follow Up Recommendations  Supervision for mobility/OOB           Equipment Recommendations  None recommended by PT    Recommendations for Other Services    Frequency Min 3X/week   Progress towards PT Goals Progress towards PT goals: Progressing toward goals  Plan Current plan remains appropriate    Precautions / Restrictions Precautions Precautions: Cervical Required Braces or Orthoses: Other Brace/Splint (soft  collar) Restrictions Weight Bearing Restrictions: No   Pertinent Vitals/Pain 10/10 pain with 'catching' spasms    Mobility  Bed Mobility Bed Mobility: Rolling Left;Left Sidelying to Sit;Sitting - Scoot to [place] of Bed;Sit to Supine Rolling Left: 5: Supervision Left Sidelying to Sit: 5: Supervision Sitting - Scoot to Edge of Bed: 6: Modified independent (Device/Increase time) Sit to Supine: 5: Supervision Details for Bed Mobility Assistance: VCs for technique and log roll, patient somewhat impuslive with preacutions, educated on importance of moving porperly to protect integrity of sx site. Transfers Transfers: Sit to Stand;Stand to Sit Sit to Stand: 4: Min guard Stand to Sit: 5: Supervision Details for Transfer Assistance: Increased instability with standing secondary to increased pain and spasms Ambulation/Gait Ambulation/Gait Assistance: 4: Min guard;4: Min assist Ambulation Distance (Feet): 500 Feet Assistive device: Rolling walker;None Ambulation/Gait Assistance Details: increased instability today secondary to increased active spasms Gait Pattern: Step-through pattern;Decreased stride length Gait velocity: decreased General Gait Details: guarded and cautious with noted instability during ambulation Stairs: No      PT Goals (current goals can now be found in the care plan section) Acute Rehab PT Goals Patient Stated Goal: to go home with friend PT Goal Formulation: With patient Time For Goal Achievement: 10/12/13 Potential to Achieve Goals: Good  Visit Information  Assistance Needed: +1 History of Present Illness: Patient is a pleasant 79 year old female who is a week and half out from a posterior cervical decompression and fusion. Initially she did very well was discharged the hospital after couple days however in the last few  days is a progress worsening pain in her neck in the back of her left shoulder the point which she's been lying in bed unable to move. Changes in  her arms or hands still says they feel better than it did preoperatively. However she's not been getting out of bed and go without them she does say that she's been urinating fine but is not a bowel movement. As patient was lying wishing at home and been refractory to conservative treatment oral steroids and brought the patient for pain control and workup.    Subjective Data  Subjective: it hurts really bad again Patient Stated Goal: to go home with friend   Cognition  Cognition Arousal/Alertness: Awake/alert Behavior During Therapy: WFL for tasks assessed/performed Overall Cognitive Status: Within Functional Limits for tasks assessed    Balance  Balance Balance Assessed: Yes Static Sitting Balance Static Sitting - Balance Support: Feet supported Static Sitting - Level of Assistance: 7: Independent Static Standing Balance Static Standing - Balance Support: No upper extremity supported;During functional activity Static Standing - Level of Assistance: 4: Min assist Static Standing - Comment/# of Minutes: some instability during active muscle spasms  End of Session PT - End of Session Equipment Utilized During Treatment: Gait belt;Cervical collar Activity Tolerance: Patient tolerated treatment well Patient left: in bed;with call bell/phone within reach Nurse Communication: Mobility status   GP     Fabio Asa 09/30/2013, 10:24 AM Charlotte Crumb, PT DPT  [phone]

## 2013-09-30 NOTE — Progress Notes (Signed)
Physical Therapy Treatment Patient Details Name: Marcia Lynch MRN: [id number] DOB: 1931/05/02 Today's Date: 09/30/2013 Time: 4098-1191 PT Time Calculation (min): 16 min  Patient still experiencing increased pain and spasms at all times. Patient demonstrates increased balance deficits. Educated patient on proper technique for mobility with cervical precautions. Will continue to see to address balance issues as tolerated. Encouraged patient to ambulate with nsg at least one more time this evening.    09/30/13 1500  PT Visit Information  Last PT Received On 09/30/13  Assistance Needed +1  History of Present Illness Patient is a pleasant 79 year old female who is a week and half out from a posterior cervical decompression and fusion. Initially she did very well was discharged the hospital after couple days however in the last few days is a progress worsening pain in her neck in the back of her left shoulder the point which she's been lying in bed unable to move. Changes in her arms or hands still says they feel better than it did preoperatively. However she's not been getting out of bed and go without them she does say that she's been urinating fine but is not a bowel movement. As patient was lying wishing at home and been refractory to conservative treatment oral steroids and brought the patient for pain control and workup.  PT Time Calculation  PT Start Time 1524  PT Stop Time 1540  PT Time Calculation (min) 16 min  Subjective Data  Subjective It keeps getting worse Re: pain  Patient Stated Goal to go home with friend  Precautions  Precautions Cervical  Required Braces or Orthoses Other Brace/Splint (soft collar)  Restrictions  Weight Bearing Restrictions No  Cognition  Arousal/Alertness Awake/alert  Behavior During Therapy WFL for tasks assessed/performed  Overall Cognitive Status Within Functional Limits for tasks assessed  Bed Mobility  Bed Mobility Rolling Left;Left Sidelying to  Sit;Sitting - Scoot to [place] of Bed;Sit to Supine  Rolling Left 5: Supervision  Left Sidelying to Sit 5: Supervision  Sitting - Scoot to Edge of Bed 6: Modified independent (Device/Increase time)  Sit to Supine 5: Supervision  Details for Bed Mobility Assistance VCs for technique and log roll, patient somewhat impuslive with preacutions, educated on importance of moving porperly to protect integrity of sx site.  Transfers  Transfers Sit to Stand;Stand to Sit  Sit to Stand 4: Min guard  Stand to Sit 5: Supervision  Details for Transfer Assistance Increased instability with standing secondary to increased pain and spasms  Ambulation/Gait  Ambulation/Gait Assistance 4: Min guard  Ambulation Distance (Feet) 500 Feet  Assistive device Rolling walker;None  Ambulation/Gait Assistance Details balance deficits still present and still experiencing increased spasms  Gait Pattern Step-through pattern;Decreased stride length  Gait velocity decreased  General Gait Details guarded and cautious with noted instability during ambulation  Stairs No  Balance  Balance Assessed Yes  Static Sitting Balance  Static Sitting - Balance Support Feet supported  Static Sitting - Level of Assistance 7: Independent  PT - End of Session  Equipment Utilized During Treatment Gait belt;Cervical collar  Activity Tolerance Patient tolerated treatment well  Patient left in bed;with call bell/phone within reach  Nurse Communication Mobility status  PT - Assessment/Plan  PT Plan Current plan remains appropriate  PT Frequency Min 3X/week  Follow Up Recommendations Supervision for mobility/OOB  PT equipment None recommended by PT  Acute Rehab PT Goals  PT Goal Formulation With patient  Time For Goal Achievement 10/12/13  Potential to Achieve Goals Good  Gillie Crisci, PT DPT  [phone]  

## 2013-09-30 NOTE — Progress Notes (Signed)
Patient ID: Marcia Lynch, female   DOB: 1931-09-10, 79 y.o.   MRN: [id number] Patient continues to feel significantly better does have a little more spasm in her left shoulder today but still not as bad as it was prior to coming in the hospital we'll continue to observe him considering doing a CT-guided aspiration of fluid collection for pain doesn't continue to improve.

## 2013-10-01 ENCOUNTER — Inpatient Hospital Stay (HOSPITAL_COMMUNITY): Payer: Medicare Other

## 2013-10-01 ENCOUNTER — Encounter (HOSPITAL_COMMUNITY): Payer: Self-pay | Admitting: Radiology

## 2013-10-01 LAB — CBC
HCT: 32.1 % — ABNORMAL LOW (ref 36.0–46.0)
Hemoglobin: 10.3 g/dL — ABNORMAL LOW (ref 12.0–15.0)
MCH: 28.4 pg (ref 26.0–34.0)
MCHC: 32.1 g/dL (ref 30.0–36.0)
MCV: 88.4 fL (ref 78.0–100.0)
Platelets: 319 10*3/uL (ref 150–400)
RBC: 3.63 MIL/uL — ABNORMAL LOW (ref 3.87–5.11)
RDW: 15.1 % (ref 11.5–15.5)
WBC: 13 10*3/uL — ABNORMAL HIGH (ref 4.0–10.5)

## 2013-10-01 LAB — PROTIME-INR
INR: 0.96 (ref 0.00–1.49)
Prothrombin Time: 12.6 seconds (ref 11.6–15.2)

## 2013-10-01 LAB — APTT: aPTT: 34 seconds (ref 24–37)

## 2013-10-01 MED ORDER — FENTANYL CITRATE 0.05 MG/ML IJ SOLN
INTRAMUSCULAR | Status: AC
  Filled 2013-10-01: qty 2

## 2013-10-01 MED ORDER — MIDAZOLAM HCL 2 MG/2ML IJ SOLN
INTRAMUSCULAR | Status: AC
  Filled 2013-10-01: qty 2

## 2013-10-01 MED ORDER — MIDAZOLAM HCL 2 MG/2ML IJ SOLN
INTRAMUSCULAR | Status: AC | PRN
  Administered 2013-10-01: 1 mg via INTRAVENOUS
  Administered 2013-10-01: 0.5 mg via INTRAVENOUS

## 2013-10-01 MED ORDER — DEXAMETHASONE SODIUM PHOSPHATE 10 MG/ML IJ SOLN
10.0000 mg | Freq: Four times a day (QID) | INTRAMUSCULAR | Status: DC
  Administered 2013-10-01 – 2013-10-04 (×13): 10 mg via INTRAVENOUS
  Filled 2013-10-01 (×16): qty 1

## 2013-10-01 MED ORDER — SODIUM CHLORIDE 0.9 % IV SOLN
INTRAVENOUS | Status: AC
  Administered 2013-10-01: 200 mL via INTRAVENOUS

## 2013-10-01 MED ORDER — FENTANYL CITRATE 0.05 MG/ML IJ SOLN
INTRAMUSCULAR | Status: AC | PRN
  Administered 2013-10-01: 12.5 ug via INTRAVENOUS
  Administered 2013-10-01: 25 ug via INTRAVENOUS

## 2013-10-01 NOTE — Procedures (Signed)
S/P Fluoro guided aspiration of approx 5 ml of blood tinged fluid   via Lt  paraspinous  approach at C4 . No acute complications.

## 2013-10-01 NOTE — Progress Notes (Signed)
Occupational Therapy Treatment Patient Details Name: Marcia Lynch MRN: [id number] DOB: [birth year] Today's Date: 10/01/2013 Time: 7829-5621 OT Time Calculation (min): 14 min  OT Assessment / Plan / Recommendation       OT comments  Pt. Awake upon arrival, agreeable to oob.  Con't. To require safety cues for all aspects of mobility and transfers secondary to her moving very impulsively as compensatory tech. For her reports of pain.  Sim. Tub and toilet transfers with min guard a.  Could not tolerate sitting in chair for b.fast and requested back to bed.  Follow Up Recommendations  Supervision - Intermittent           Equipment Recommendations  Tub/shower seat        Frequency Min 2X/week   Progress towards OT Goals Progress towards OT goals: Progressing toward goals  Plan Discharge plan remains appropriate    Precautions / Restrictions Precautions Precautions: Cervical Required Braces or Orthoses: Other Brace/Splint   Pertinent Vitals/Pain 10/10, assisted with repositioning    ADL  Toilet Transfer: Simulated;Min guard Toilet Transfer Method: Stand pivot Tub/Shower Transfer: Simulated;Min guard Tub/Shower Transfer Method: Science writer: Shower seat with back;Grab bars Equipment Used: Rolling walker;Other (comment) (soft collar) Transfers/Ambulation Related to ADLs: min guard, with cues for safety as pt. is impulsive and moves very fast ADL Comments: sim. toilet and tub transfer with min guard, mostly for safety cues with amb.      OT Goals(current goals can now be found in the care plan section)    Visit Information  Last OT Received On: 10/01/13    Subjective Data   "i just can't, i just can't, none of this is working" (in response to positioning in recliner)          Cognition  Cognition Arousal/Alertness: Awake/alert Behavior During Therapy: WFL for tasks assessed/performed Overall Cognitive Status: Within Functional Limits  for tasks assessed    Mobility  Bed Mobility Details for Bed Mobility Assistance: vss for log roll tech., pt. was eob and went into right sidelying without warning secondary to c/o pain, then sat back up.  inst. pt. not to move like that without warning as she could hurt hurself, esp. how fast she did it Transfers Transfers: Sit to Stand;Stand to Sit Sit to Stand: 4: Min guard;From bed Stand to Sit: 5: Supervision;With armrests;To chair/3-in-1               End of Session OT - End of Session Equipment Utilized During Treatment: Rolling walker;Cervical collar Activity Tolerance: Patient limited by pain Patient left: in bed;with call bell/phone within reach       Robet Leu, COTA/L 10/01/2013, 8:03 AM

## 2013-10-01 NOTE — Progress Notes (Signed)
PT Cancellation Note  Patient Details Name: Marcia Lynch MRN: [id number] DOB: Apr 28, 1931   Cancelled Treatment:    Reason Eval/Treat Not Completed: Pain limiting ability to participate. Patient is in a lot of pain today and requested x2 to not ambulate. Will try this weekend if time allows.    Fredrich Birks 10/01/2013, 2:05 PM

## 2013-10-01 NOTE — Progress Notes (Signed)
Patient ID: Marcia Lynch, female   DOB: 1931/05/09, 79 y.o.   MRN: [id number] Gen. lumbar pain her neck today she still has no radicular symptoms her hands and legs still feel great with no new numbness or tingling but her pain is much worse in her neck she is now off of her steroids which I distractor again significantly. Her exam remains stable I will start her back on Decadron I am going to consult radiology for a CT-guided aspiration of the fluid collection a posterior cervical wound

## 2013-10-01 NOTE — H&P (Signed)
Marcia Lynch is an 79 y.o. female.   Chief Complaint: C spine fusion 09/20/13 Continues neck pain; weakness MRI reveals new fluid collection from C2-C7 Scheduled now for aspiration of same HPI: Headache; mild dementia  Past Medical History  Diagnosis Date  . Headache(784.0)   . Depression     TAKING ZOLOFT FOR A LONG TIME  . Dementia     STARTING SIGNS OF DEMENTIA    Past Surgical History  Procedure Laterality Date  . Fracture surgery      RIGHT HIP-HAS SCREW  . Rotator cuff repair      RIGHT SHOULDER  . Shoulder arthroscopy      ? LEFT BONE SPURSS  . Carpal tunnel release      RIGHT  . Eye surgery      BILATERAL CATARACTS  . Posterior cervical fusion/foraminotomy N/A 09/20/2013    Procedure: CERVICAL THREE TO CERVICAL SEVEN  POSTERIOR CERVICAL FUSION;  Surgeon: Mariam Dollar, MD;  Location: [facility];  Service: Neurosurgery;  Laterality: N/A;  CERVICAL THREE TO CERVICAL SEVEN  POSTERIOR CERVICAL FUSION    No family history on file. Social History:  reports that she has been smoking Cigarettes.  She has a 17 pack-year smoking history. She does not have any smokeless tobacco history on file. She reports that she does not drink alcohol or use illicit drugs.  Allergies:  Allergies  Allergen Reactions  . Cymbalta [Duloxetine Hcl] Rash    Medications Prior to Admission  Medication Sig Dispense Refill  . alendronate (FOSAMAX) 70 MG tablet Take 70 mg by mouth every 7 (seven) days. Take with a full glass of water on an empty stomach.  On Sunday      . buPROPion (WELLBUTRIN XL) 150 MG 24 hr tablet Take 150 mg by mouth daily.      . cyclobenzaprine (FLEXERIL) 10 MG tablet Take 1 tablet (10 mg total) by mouth 3 (three) times daily as needed for muscle spasms.  80 tablet  1  . donepezil (ARICEPT) 10 MG tablet Take 20 mg by mouth daily.      . mirtazapine (REMERON) 15 MG tablet Take 15 mg by mouth at bedtime as needed (restless legs).      . Multiple Vitamins-Minerals  (MULTIVITAMIN PO) Take 1 tablet by mouth daily.      Marland Kitchen oxyCODONE-acetaminophen (PERCOCET/ROXICET) 5-325 MG per tablet Take 1-2 tablets by mouth every 4 (four) hours as needed.  80 tablet  0  . sertraline (ZOLOFT) 100 MG tablet Take 100 mg by mouth daily.      . simvastatin (ZOCOR) 40 MG tablet Take 40 mg by mouth every evening.      . traMADol (ULTRAM) 50 MG tablet Take 50 mg by mouth 3 (three) times daily.        No results found for this or any previous visit (from the past 48 hour(s)). No results found.  Review of Systems  Constitutional: Negative for fever.  Respiratory: Negative for shortness of breath.   Cardiovascular: Negative for chest pain.  Gastrointestinal: Negative for nausea, vomiting and abdominal pain.  Musculoskeletal: Positive for back pain and neck pain.  Neurological: Positive for weakness and headaches. Negative for dizziness.  Psychiatric/Behavioral: Positive for substance abuse.       Smoker    Blood pressure 135/64, pulse 108, temperature 98.4 F (36.9 C), temperature source Oral, resp. rate 16, height 5\' 4"  (1.626 m), weight 107 lb 12.8 oz (48.898 kg), SpO2 86.00%. Physical Exam  Constitutional: She  is oriented to person, place, and time.  thin  Neck:  In soft C collar Pt says she is able to lay on stomach  Cardiovascular: Normal rate, regular rhythm and normal heart sounds.   Respiratory: Effort normal. She has wheezes.  GI: Soft. Bowel sounds are normal. There is no tenderness.  Musculoskeletal: Normal range of motion.  Neurological: She is alert and oriented to person, place, and time.  Skin: Skin is warm and dry.  Psychiatric: She has a normal mood and affect. Her behavior is normal. Judgment and thought content normal.     Assessment/Plan C spine surgery 09/20/13 Continued pain New fluid collection on MRI Scheduled for aspiration Pt aware of procedure benefits and risks and agreeable to proceed Consent signed and in chart Ate breakfast at 8am  -npo since then - IR will call for pt after 2-3pm    Kura Bethards A 10/01/2013, 1:08 PM

## 2013-10-02 NOTE — Progress Notes (Signed)
Pt says her neck pain is unchanged, ok while lying still and worse when shes up. No new c/o.  EXAM:  BP 168/62  Pulse 72  Temp(Src) 97.9 F (36.6 C) (Oral)  Resp 18  Ht 5\' 4"  (1.626 m)  Wt 48.898 kg (107 lb 12.8 oz)  BMI 18.49 kg/m2  SpO2 98%  Awake, alert, oriented  Speech fluent, appropriate  CN grossly intact  5/5 BUE/BLE   IMPRESSION:  79 y.o. female with postoperative neck pain s/p CT guided aspiration of fluid collection.  PLAN: - Cont to ambulate - Cont steroids

## 2013-10-02 NOTE — Progress Notes (Signed)
Pt slept comfortably during night but woke up to use the BR this am with c/o pain in Left shoulder. Pt c/o laying in one position when sleeping and becoming very uncomfortable after waking up but when asked if she needs help with turning in bed she said NO and that laying that position was only way she could get comfortable and relief of her pain. Pt was informed of sleeping quietly during rounding on her and we didn't wake her up during those times. Pt expressed not knowing what is going on with her tx here and how her insurance bill is increasing from her stay. Pt also had some complaints about checking her BP frequently but was explained to that it was part of MD order following her procedure and that was for her own safety too. Pt expressed understanding but a little emotional with slight agitation concerning her prognosis and treatment. Pt sleeping quietly in bed with call light within reach. Will continue to monitor. Rodell Perna Gaylin Osoria RN.

## 2013-10-02 NOTE — Progress Notes (Signed)
Ambulated patient to bathroom which she tolerated well, however on return she was in significant pain and could not sit in the chair, wanted to get back to bed. Patient placed in bed with call bell in reach and pain medicine given  Minor, Morrie Sheldon Narjis Mira

## 2013-10-03 LAB — WOUND CULTURE
Culture: NO GROWTH
Special Requests: 34

## 2013-10-03 NOTE — Progress Notes (Signed)
Ambulated patient around unit, standby assist. Back to bed with call bell in reach

## 2013-10-03 NOTE — Progress Notes (Signed)
Patient ID: Marcia Lynch, female   DOB: 1931/11/18, 79 y.o.   MRN: [id number] BP 150/62  Pulse 64  Temp(Src) 98.3 F (36.8 C) (Oral)  Resp 16  Ht 5\' 4"  (1.626 m)  Wt 48.898 kg (107 lb 12.8 oz)  BMI 18.49 kg/m2  SpO2 97% Pain is much improved Now weakness in the extremities Needs a full day feeling better before discharge Cultures are negative as of now.

## 2013-10-04 MED ORDER — OXYCODONE-ACETAMINOPHEN 5-325 MG PO TABS: 1.0000 | ORAL_TABLET | ORAL | Status: DC | PRN

## 2013-10-04 MED ORDER — METHYLPREDNISOLONE (PAK) 4 MG PO TABS: ORAL_TABLET | ORAL | Status: DC

## 2013-10-04 NOTE — Progress Notes (Signed)
Pt for discharge home today IV D/C with dressing CDI to posterior neck.  D/C instructions and Rx given with verbalized understanding.  Family at bedside to assist pt with discharge. Staff walked pt downstairs upon discharge.

## 2013-10-04 NOTE — Discharge Summary (Signed)
  Physician Discharge Summary  Patient ID: Marcia Lynch MRN: [id number] DOB/AGE: 77-Dec-1932 77 y.o.  Admit date: 09/28/2013 Discharge date: 10/04/2013  Admission Diagnoses: Neck pain postoperative seromatous  Discharge Diagnoses: Same Active Problems:   * No active hospital problems. *   Discharged Condition: good  Hospital Course: Patient is admitted hospital for pain control underwent evaluation with MRI scan which showed a postoperative seromatous patient clinically improved on IV Decadron however as of Decadron was weaned off the pain recurred with severe was all localized to her neck and posterior shoulder she had no radicular signs or no signs of myelopathy. I sent her down to x-ray she underwent CT-guided aspiration of fluid cultures remain negative however patient has significant pain control from the aspiration was stable and be discharged home on hospital day 5. At the time of discharge patient was awake alert oriented neurologically intact with good pain control  Consults: Significant Diagnostic Studies: Treatments: CT-guided aspiration of posterior cervical cerumen Discharge Exam: Blood pressure 167/66, pulse 68, temperature 98.7 F (37.1 C), temperature source Oral, resp. rate 16, height 5\' 4"  (1.626 m), weight 48.898 kg (107 lb 12.8 oz), SpO2 100.00%. Strength out of 5 wound clean and dry  Disposition: Home     Medication List         alendronate 70 MG tablet  Commonly known as:  FOSAMAX  Take 70 mg by mouth every 7 (seven) days. Take with a full glass of water on an empty stomach.  On Sunday     buPROPion 150 MG 24 hr tablet  Commonly known as:  WELLBUTRIN XL  Take 150 mg by mouth daily.     cyclobenzaprine 10 MG tablet  Commonly known as:  FLEXERIL  Take 1 tablet (10 mg total) by mouth 3 (three) times daily as needed for muscle spasms.     donepezil 10 MG tablet  Commonly known as:  ARICEPT  Take 20 mg by mouth daily.     methylPREDNIsolone 4 MG  tablet  Commonly known as:  MEDROL DOSPACK  follow package directions     mirtazapine 15 MG tablet  Commonly known as:  REMERON  Take 15 mg by mouth at bedtime as needed (restless legs).     MULTIVITAMIN PO  Take 1 tablet by mouth daily.     oxyCODONE-acetaminophen 5-325 MG per tablet  Commonly known as:  PERCOCET/ROXICET  Take 1-2 tablets by mouth every 4 (four) hours as needed.     oxyCODONE-acetaminophen 5-325 MG per tablet  Commonly known as:  PERCOCET/ROXICET  Take 1-2 tablets by mouth every 4 (four) hours as needed.     sertraline 100 MG tablet  Commonly known as:  ZOLOFT  Take 100 mg by mouth daily.     simvastatin 40 MG tablet  Commonly known as:  ZOCOR  Take 40 mg by mouth every evening.     traMADol 50 MG tablet  Commonly known as:  ULTRAM  Take 50 mg by mouth 3 (three) times daily.           Follow-up Information   Follow up with [facility], MD.   Specialty:  Neurosurgery   Contact information:   [address] [place] [address] [phone]       Signed: Tonya Carlile P 10/04/2013, 2:16 PM

## 2013-10-04 NOTE — Progress Notes (Signed)
Patient ID: Marcia Lynch, female   DOB: 08/02/1931, 79 y.o.   MRN: [id number] Doing great discharge him

## 2013-10-04 NOTE — Progress Notes (Signed)
Physical Therapy Treatment Patient Details Name: Marcia Lynch MRN: [id number] DOB: [birth year] Today's Date: 10/04/2013 Time: 4098-1191 PT Time Calculation (min): 11 min  PT Assessment / Plan / Recommendation  History of Present Illness Patient is a pleasant 79 year old female who is a week and half out from a posterior cervical decompression and fusion. Initially she did very well was discharged the hospital after couple days however in the last few days is a progress worsening pain in her neck in the back of her left shoulder the point which she's been lying in bed unable to move. Changes in her arms or hands still says they feel better than it did preoperatively. However she's not been getting out of bed and go without them she does say that she's been urinating fine but is not a bowel movement. As patient was lying wishing at home and been refractory to conservative treatment oral steroids and brought the patient for pain control and workup.   PT Comments   Progressing well. Anticipate DC today  Follow Up Recommendations  Supervision for mobility/OOB     Does the patient have the potential to tolerate intense rehabilitation     Barriers to Discharge        Equipment Recommendations  None recommended by PT    Recommendations for Other Services    Frequency Min 3X/week   Progress towards PT Goals Progress towards PT goals: Progressing toward goals  Plan Current plan remains appropriate    Precautions / Restrictions Precautions Precautions: Cervical Required Braces or Orthoses: Cervical Brace Cervical Brace: Soft collar;At all times Restrictions Weight Bearing Restrictions: No   Pertinent Vitals/Pain no apparent distress     Mobility  Bed Mobility Bed Mobility: Supine to Sit;Sitting - Scoot to [place] of Bed;Sit to Supine Rolling Right: 6: Modified independent (Device/Increase time) Right Sidelying to Sit: 6: Modified independent (Device/Increase time) Supine to Sit: 6:  Modified independent (Device/Increase time) Sitting - Scoot to Edge of Bed: 6: Modified independent (Device/Increase time) Sit to Supine: 6: Modified independent (Device/Increase time) Details for Bed Mobility Assistance: pt is at adequate level for d/c home Transfers Sit to Stand: 6: Modified independent (Device/Increase time);With upper extremity assist;From bed Stand to Sit: 6: Modified independent (Device/Increase time);With upper extremity assist;To bed Ambulation/Gait Ambulation/Gait Assistance: 5: Supervision Ambulation Distance (Feet): 600 Feet Assistive device: None Ambulation/Gait Assistance Details: initially unsteady but improved with time Gait Pattern: Step-through pattern;Decreased stride length Stairs Assistance: 5: Supervision Stair Management Technique: One rail Left;Forwards Number of Stairs: 5    Exercises     PT Diagnosis:    PT Problem List:   PT Treatment Interventions:     PT Goals (current goals can now be found in the care plan section) Acute Rehab PT Goals Patient Stated Goal: to go home with friend  Visit Information  Last PT Received On: 10/04/13 Assistance Needed: +1 History of Present Illness: Patient is a pleasant 79 year old female who is a week and half out from a posterior cervical decompression and fusion. Initially she did very well was discharged the hospital after couple days however in the last few days is a progress worsening pain in her neck in the back of her left shoulder the point which she's been lying in bed unable to move. Changes in her arms or hands still says they feel better than it did preoperatively. However she's not been getting out of bed and go without them she does say that she's been urinating fine but is not a bowel movement.  As patient was lying wishing at home and been refractory to conservative treatment oral steroids and brought the patient for pain control and workup.    Subjective Data  Patient Stated Goal: to go home  with friend   Cognition  Cognition Arousal/Alertness: Awake/alert Behavior During Therapy: WFL for tasks assessed/performed Overall Cognitive Status: Within Functional Limits for tasks assessed    Balance     End of Session PT - End of Session Equipment Utilized During Treatment: Gait belt;Cervical collar Activity Tolerance: Patient tolerated treatment well Patient left: with call bell/phone within reach;in chair   GP     Min Collymore, Adline Potter 10/04/2013, 12:40 PM 10/04/2013 Fredrich Birks PTA [phone] pager [phone] office

## 2013-10-04 NOTE — Progress Notes (Signed)
Occupational Therapy Treatment Patient Details Name: Allexa Acoff MRN: [id number] DOB: February 04, 1931 Today's Date: 10/04/2013 Time: 4098-1191 OT Time Calculation (min): 11 min  OT Assessment / Plan / Recommendation  History of present illness Patient is a pleasant 79 year old female who is a week and half out from a posterior cervical decompression and fusion. Initially she did very well was discharged the hospital after couple days however in the last few days is a progress worsening pain in her neck in the back of her left shoulder the point which she's been lying in bed unable to move. Changes in her arms or hands still says they feel better than it did preoperatively. However she's not been getting out of bed and go without them she does say that she's been urinating fine but is not a bowel movement. As patient was lying wishing at home and been refractory to conservative treatment oral steroids and brought the patient for pain control and workup.   OT comments  Pt is at adequate level for d/c home and will have caregiver support. Pt has questions regarding pain management and medications prior to d/c home.    Follow Up Recommendations  Supervision - Intermittent    Barriers to Discharge       Equipment Recommendations  None recommended by OT    Recommendations for Other Services    Frequency Min 2X/week   Progress towards OT Goals Progress towards OT goals: Progressing toward goals  Plan Discharge plan remains appropriate    Precautions / Restrictions Precautions Precautions: Cervical Required Braces or Orthoses: Cervical Brace Cervical Brace: Soft collar;At all times Restrictions Weight Bearing Restrictions: No   Pertinent Vitals/Pain No pain  When asked about shoulder pain - pt states I have a little in this shoulder. Pt holding left shoulder    ADL  Tub/Shower Transfer: Supervision/safety Tub/Shower Transfer Method: Science writer: Walk in  shower;Shower seat without back Transfers/Ambulation Related to ADLs: pt ambulating without DME to gym for tub transfer. Pt with sligh drift while walking and talking. pt never loss her balance but rather drifting to the opposite side of the hall ADL Comments: Pt will have landlord (A) for d/c home and groceries. Pt is anxious to d/c home today her dog "Sugar". Pt is concerned with medications masking pain without the doctors knowing. pt educated that the medications are for pain management so patient can return to independence. Pt states "Oh I dont understand how this stuff works" Pt reports small amount of pain in left shoulder. Pt fully dressed on arrival and reports completing all adls this morning at sink level.     OT Diagnosis:    OT Problem List:   OT Treatment Interventions:     OT Goals(current goals can now be found in the care plan section) Acute Rehab OT Goals Patient Stated Goal: to go home with friend OT Goal Formulation: With patient Time For Goal Achievement: 10/05/13 Potential to Achieve Goals: Good ADL Goals Pt Will Perform Grooming: with modified independence;standing Pt Will Perform Upper Body Bathing: with modified independence;sitting Pt Will Perform Lower Body Bathing: with modified independence;with adaptive equipment;sit to/from stand Pt Will Perform Upper Body Dressing: with modified independence;sitting Pt Will Perform Lower Body Dressing: with modified independence;with adaptive equipment;sit to/from stand Pt Will Transfer to Toilet: with modified independence;ambulating;bedside commode Pt Will Perform Toileting - Clothing Manipulation and hygiene: with modified independence;sit to/from stand Additional ADL Goal #1: Pt will independently verbalize and demonstrate 3/3 cervical precautions.   Visit Information  Last OT Received On: 10/04/13 Assistance Needed: +1 History of Present Illness: Patient is a pleasant 79 year old female who is a week and half out from a  posterior cervical decompression and fusion. Initially she did very well was discharged the hospital after couple days however in the last few days is a progress worsening pain in her neck in the back of her left shoulder the point which she's been lying in bed unable to move. Changes in her arms or hands still says they feel better than it did preoperatively. However she's not been getting out of bed and go without them she does say that she's been urinating fine but is not a bowel movement. As patient was lying wishing at home and been refractory to conservative treatment oral steroids and brought the patient for pain control and workup.    Subjective Data      Prior Functioning       Cognition  Cognition Arousal/Alertness: Awake/alert Behavior During Therapy: WFL for tasks assessed/performed Overall Cognitive Status: Within Functional Limits for tasks assessed    Mobility  Bed Mobility Bed Mobility: Supine to Sit;Sitting - Scoot to [place] of Bed;Sit to Supine Rolling Right: 6: Modified independent (Device/Increase time) Right Sidelying to Sit: 6: Modified independent (Device/Increase time) Supine to Sit: 6: Modified independent (Device/Increase time) Sitting - Scoot to Edge of Bed: 6: Modified independent (Device/Increase time) Sit to Supine: 6: Modified independent (Device/Increase time) Details for Bed Mobility Assistance: pt is at adequate level for d/c home Transfers Transfers: Sit to Stand;Stand to Sit Sit to Stand: 6: Modified independent (Device/Increase time);With upper extremity assist;From bed Stand to Sit: 6: Modified independent (Device/Increase time);With upper extremity assist;To bed    Exercises      Balance     End of Session OT - End of Session Activity Tolerance: Patient tolerated treatment well Patient left: in bed;with call bell/phone within reach Nurse Communication: Mobility status  GO     Harolyn Rutherford 10/04/2013, 9:12 AM Pager: [phone]

## 2013-10-06 LAB — ANAEROBIC CULTURE

## 2013-11-13 LAB — AFB CULTURE WITH SMEAR (NOT AT ARMC): Acid Fast Smear: NONE SEEN

## 2015-05-04 ENCOUNTER — Other Ambulatory Visit: Payer: Self-pay | Admitting: Neurosurgery

## 2015-05-09 ENCOUNTER — Encounter (HOSPITAL_COMMUNITY)
Admission: RE | Admit: 2015-05-09 | Discharge: 2015-05-09 | Disposition: A | Discharge status: Home / Self Care | Payer: Medicare HMO | Source: Ambulatory Visit | Attending: Neurosurgery | Admitting: Neurosurgery

## 2015-05-09 ENCOUNTER — Encounter (HOSPITAL_COMMUNITY)
Admission: RE | Admit: 2015-05-09 | Discharge: 2015-05-09 | Disposition: A | Discharge status: Home / Self Care | Payer: Medicare HMO | Source: Ambulatory Visit | Attending: Anesthesiology | Admitting: Anesthesiology

## 2015-05-09 ENCOUNTER — Encounter (HOSPITAL_COMMUNITY): Payer: Self-pay

## 2015-05-09 DIAGNOSIS — Z01818 Encounter for other preprocedural examination: Secondary | ICD-10-CM

## 2015-05-09 HISTORY — DX: Unspecified osteoarthritis, unspecified site: M19.90

## 2015-05-09 HISTORY — DX: Rheumatoid arthritis, unspecified: M06.9

## 2015-05-09 HISTORY — DX: Cough: R05

## 2015-05-09 HISTORY — DX: Cough, unspecified: R05.9

## 2015-05-09 HISTORY — DX: Essential (primary) hypertension: I10

## 2015-05-09 LAB — CBC
HCT: 35 % — ABNORMAL LOW (ref 36.0–46.0)
Hemoglobin: 11.4 g/dL — ABNORMAL LOW (ref 12.0–15.0)
MCH: 30.4 pg (ref 26.0–34.0)
MCHC: 32.6 g/dL (ref 30.0–36.0)
MCV: 93.3 fL (ref 78.0–100.0)
Platelets: 272 10*3/uL (ref 150–400)
RBC: 3.75 MIL/uL — ABNORMAL LOW (ref 3.87–5.11)
RDW: 14.1 % (ref 11.5–15.5)
WBC: 6.1 10*3/uL (ref 4.0–10.5)

## 2015-05-09 LAB — BASIC METABOLIC PANEL
Anion gap: 9 (ref 5–15)
BUN: 16 mg/dL (ref 6–20)
CO2: 26 mmol/L (ref 22–32)
Calcium: 8.8 mg/dL — ABNORMAL LOW (ref 8.9–10.3)
Chloride: 105 mmol/L (ref 101–111)
Creatinine, Ser: 1.32 mg/dL — ABNORMAL HIGH (ref 0.44–1.00)
GFR calc Af Amer: 43 mL/min — ABNORMAL LOW (ref >60)
GFR calc non Af Amer: 37 mL/min — ABNORMAL LOW (ref >60)
Glucose, Bld: 149 mg/dL — ABNORMAL HIGH (ref 65–99)
Potassium: 3.9 mmol/L (ref 3.5–5.1)
Sodium: 140 mmol/L (ref 135–145)

## 2015-05-09 LAB — SURGICAL PCR SCREEN
MRSA, PCR: NEGATIVE
Staphylococcus aureus: NEGATIVE

## 2015-05-09 LAB — TYPE AND SCREEN
ABO/RH(D): A NEG
Antibody Screen: NEGATIVE

## 2015-05-09 LAB — ABO/RH: ABO/RH(D): A NEG

## 2015-05-09 NOTE — Progress Notes (Signed)
Anesthesia PAT Evaluation:  Patient is a 81 year old female scheduled for posterior cervical fusion with lateral mass fixation, C7-T1, exploration and removal of hardware tomorrow by Dr. Saintclair Halsted.  PAT was at 3 PM today.  Case was posted on 05/04/15. She has been having neck pain for 4-5 months and developed RUE weakness and numbness over the past two weeks. According to patient, it sounded like surgery was felt to be of a somewhat urgent nature due to her symptoms.    History includes RA (on prednisone), smoking, depression, headaches, dementia (mild), HTN, s/p posterior cervical decompressive laminectomy/fusion C3-7 09/20/13.  BMI is only 16. Today she is here with her roommate Jackelyn Poling [phone]). PCP is Dr. Idamae Schuller with [facility] (see Care Everywhere).  Rheumatologist is Dr. Charlestine Night.  I was asked to evaluate patient at PAT due to cough.  She reports a history of smoking for 68 years. She is currently smoking < 1/2 PPD.  She has a chronic "smoker's cough".  Around 05/05/15 patient developed a runny nose and her cough became more productive.  Mucous is clear.  Her roommate reports the quality of patient's cough sounds unchanged from her baseline. No fever.  No new SOB.  No sore throat.  She does not feel ill.  She has been taking Mucinex. Symptoms have not worsened.  She was started on doxycycline, but reported that Dr. Saintclair Halsted started this at her last visit due to an arm laceration.  She has an albuterol MDI, but does not use it.  She has been on chronic prednisone for RA ('5mg'$  alternating with 10 mg/day for at least several months).  She is also on daily Arava for RA. Dr. Saintclair Halsted did not instruct her to hold this. She is not on any home O2. Exam shows a thin, Caucasian female in NAD. No conversational dyspnea.  Minimal clear rhinnorrhea.  Heart RRR. Lungs with intermittent scattered rhonchi.  Cough seemed a little tight, but overall lungs sounds showed some improvement afterwards.  Still with  coarseness at the bases.     05/09/15 EKG: NSR, possible LAE.  I think her EKG appears stable since 09/20/13.  Preoperative CBC and BMET noted. WBC WNL.   05/09/15 CXR: IMPRESSION: COPD and evidence of previous granulomatous infection. There is no active cardiopulmonary disease.  Above discussed with anesthesiologist Dr. Therisa Doyne. Patient does not feel ill and does not appear acutely sick, but does have mild, clear rhinorrhea and a productive cough which is not entirely new.  She is a long time smoker. Her lungs sounds showed scattered rhonchi with overall improvement (but not entirely clear) after coughing, so a decision was made to check a chest xray which did not show evidence of acute disease. Her O2 sats were 99%. She has albuterol at home, so she was instructed to use as directed at least once tonight and once in the morning. I think at least some of her symptoms are related to her smoking (probable COPD, although she doesn't report a formal diagnosis). It is possible she is also dealing with allergies or viral illness as well. We discussed how her RA medications could effect her immune response. She will be be evaluated by her anesthesiologist tomorrow.  We discussed that If her symptoms or lungs sounds have worsened then it is possible her surgery could be delayed.  We discussed that if her surgery proceeded as planned that she could require post-operative O2 or even extra time on the ventilator.  She denied any  difficultly with extubation following her last neck surgery.   Further evaluation by Dr. Saintclair Halsted and her anesthesiologist tomorrow to determine the definitive plan.  George Hugh [facility] Short Stay Center/Anesthesiology Phone [phone] 05/09/2015 5:13 PM

## 2015-05-09 NOTE — Progress Notes (Signed)
Office called to sign and release orders

## 2015-05-09 NOTE — Pre-Procedure Instructions (Addendum)
Marcia Lynch  05/09/2015   Your procedure is scheduled on:  05/10/15  Report to [facility] short stay admitting at 730 AM.  Call this number if you have problems the morning of surgery: [phone]   Remember:   Do not eat food or drink liquids after midnight.   Take these medicines the morning of surgery with A SIP OF WATER: propranolol,aricept, doxcycline,, zoloft, prednisone,  Flexeril and pain med if needed    STOP all herbel meds, nsaids (aleve,naproxen,advil,ibuprofen) now including vitamins, calcium, aspirin   Do not wear jewelry, make-up or nail polish.  Do not wear lotions, powders, or perfumes. You may wear deodorant.  Do not shave 48 hours prior to surgery. Men may shave face and neck.  Do not bring valuables to the hospital.  [facility] is not responsible                  for any belongings or valuables.               Contacts, dentures or bridgework may not be worn into surgery.  Leave suitcase in the car. After surgery it may be brought to your room.  For patients admitted to the hospital, discharge time is determined by your                treatment team.               Patients discharged the day of surgery will not be allowed to drive  home.  Name and phone number of your driver:   Special Instructions:  Special Instructions: [facility] - Preparing for Surgery  Before surgery, you can play an important role.  Because skin is not sterile, your skin needs to be as free of germs as possible.  You can reduce the number of germs on you skin by washing with CHG (chlorahexidine gluconate) soap before surgery.  CHG is an antiseptic cleaner which kills germs and bonds with the skin to continue killing germs even after washing.  Please DO NOT use if you have an allergy to CHG or antibacterial soaps.  If your skin becomes reddened/irritated stop using the CHG and inform your nurse when you arrive at Short Stay.  Do not shave (including legs and underarms) for at least 48 hours  prior to the first CHG shower.  You may shave your face.  Please follow these instructions carefully:   1.  Shower with CHG Soap the night before surgery and the morning of Surgery.  2.  If you choose to wash your hair, wash your hair first as usual with your normal shampoo.  3.  After you shampoo, rinse your hair and body thoroughly to remove the Shampoo.  4.  Use CHG as you would any other liquid soap.  You can apply chg directly  to the skin and wash gently with scrungie or a clean washcloth.  5.  Apply the CHG Soap to your body ONLY FROM THE NECK DOWN.  Do not use on open wounds or open sores.  Avoid contact with your eyes ears, mouth and genitals (private parts).  Wash genitals (private parts)       with your normal soap.  6.  Wash thoroughly, paying special attention to the area where your surgery will be performed.  7.  Thoroughly rinse your body with warm water from the neck down.  8.  DO NOT shower/wash with your normal soap after using and rinsing off the CHG Soap.  9.  Pat yourself dry with a clean towel.            10.  Wear clean pajamas.            11.  Place clean sheets on your bed the night of your first shower and do not sleep with pets.  Day of Surgery  Do not apply any lotions/deodorants the morning of surgery.  Please wear clean clothes to the hospital/surgery center.   Please read over the following fact sheets that you were given: Pain Booklet, Coughing and Deep Breathing, Blood Transfusion Information, MRSA Information and Surgical Site Infection Prevention

## 2015-05-10 ENCOUNTER — Inpatient Hospital Stay (HOSPITAL_COMMUNITY): Payer: Medicare HMO | Admitting: Certified Registered Nurse Anesthetist

## 2015-05-10 ENCOUNTER — Inpatient Hospital Stay (HOSPITAL_COMMUNITY): Payer: Medicare HMO | Admitting: Vascular Surgery

## 2015-05-10 ENCOUNTER — Encounter (HOSPITAL_COMMUNITY): Payer: Self-pay | Admitting: Certified Registered Nurse Anesthetist

## 2015-05-10 ENCOUNTER — Encounter (HOSPITAL_COMMUNITY)
Admission: RE | Disposition: A | Discharge status: Home Health | Payer: Self-pay | Source: Ambulatory Visit | Attending: Neurosurgery

## 2015-05-10 ENCOUNTER — Inpatient Hospital Stay (HOSPITAL_COMMUNITY): Payer: Medicare HMO

## 2015-05-10 ENCOUNTER — Inpatient Hospital Stay (HOSPITAL_COMMUNITY)
Admission: RE | Admit: 2015-05-10 | Discharge: 2015-05-11 | DRG: 472 | Disposition: A | Discharge status: Home Health | Payer: Medicare HMO | Source: Ambulatory Visit | Attending: Neurosurgery | Admitting: Neurosurgery

## 2015-05-10 DIAGNOSIS — M5020 Other cervical disc displacement, unspecified cervical region: Secondary | ICD-10-CM | POA: Diagnosis present

## 2015-05-10 DIAGNOSIS — Y838 Other surgical procedures as the cause of abnormal reaction of the patient, or of later complication, without mention of misadventure at the time of the procedure: Secondary | ICD-10-CM | POA: Diagnosis present

## 2015-05-10 DIAGNOSIS — T84226A Displacement of internal fixation device of vertebrae, initial encounter: Secondary | ICD-10-CM | POA: Diagnosis present

## 2015-05-10 DIAGNOSIS — M069 Rheumatoid arthritis, unspecified: Secondary | ICD-10-CM | POA: Diagnosis present

## 2015-05-10 DIAGNOSIS — R51 Headache: Secondary | ICD-10-CM | POA: Diagnosis present

## 2015-05-10 DIAGNOSIS — M532X3 Spinal instabilities, cervicothoracic region: Secondary | ICD-10-CM | POA: Diagnosis present

## 2015-05-10 DIAGNOSIS — Z7952 Long term (current) use of systemic steroids: Secondary | ICD-10-CM | POA: Diagnosis not present

## 2015-05-10 DIAGNOSIS — F0390 Unspecified dementia without behavioral disturbance: Secondary | ICD-10-CM | POA: Diagnosis present

## 2015-05-10 DIAGNOSIS — Z981 Arthrodesis status: Secondary | ICD-10-CM | POA: Diagnosis not present

## 2015-05-10 DIAGNOSIS — Z79899 Other long term (current) drug therapy: Secondary | ICD-10-CM | POA: Diagnosis not present

## 2015-05-10 DIAGNOSIS — M4712 Other spondylosis with myelopathy, cervical region: Secondary | ICD-10-CM | POA: Diagnosis present

## 2015-05-10 DIAGNOSIS — I10 Essential (primary) hypertension: Secondary | ICD-10-CM | POA: Diagnosis present

## 2015-05-10 DIAGNOSIS — F17210 Nicotine dependence, cigarettes, uncomplicated: Secondary | ICD-10-CM | POA: Diagnosis present

## 2015-05-10 DIAGNOSIS — F329 Major depressive disorder, single episode, unspecified: Secondary | ICD-10-CM | POA: Diagnosis present

## 2015-05-10 DIAGNOSIS — M5013 Cervical disc disorder with radiculopathy, cervicothoracic region: Secondary | ICD-10-CM | POA: Diagnosis present

## 2015-05-10 DIAGNOSIS — Z888 Allergy status to other drugs, medicaments and biological substances status: Secondary | ICD-10-CM

## 2015-05-10 DIAGNOSIS — Z419 Encounter for procedure for purposes other than remedying health state, unspecified: Secondary | ICD-10-CM

## 2015-05-10 HISTORY — PX: POSTERIOR CERVICAL FUSION/FORAMINOTOMY: SHX5038

## 2015-05-10 SURGERY — POSTERIOR CERVICAL FUSION/FORAMINOTOMY LEVEL 1
Anesthesia: General

## 2015-05-10 MED ORDER — ADULT MULTIVITAMIN W/MINERALS CH
1.0000 | ORAL_TABLET | Freq: Every day | ORAL | Status: DC
  Administered 2015-05-10 – 2015-05-11 (×2): 1 via ORAL
  Filled 2015-05-10 (×2): qty 1

## 2015-05-10 MED ORDER — 0.9 % SODIUM CHLORIDE (POUR BTL) OPTIME
TOPICAL | Status: DC | PRN
  Administered 2015-05-10: 1000 mL

## 2015-05-10 MED ORDER — BACITRACIN 50000 UNITS IM SOLR: INTRAMUSCULAR | Status: DC | PRN

## 2015-05-10 MED ORDER — NEOSTIGMINE METHYLSULFATE 10 MG/10ML IV SOLN
INTRAVENOUS | Status: DC | PRN
  Administered 2015-05-10: 3 mg via INTRAVENOUS

## 2015-05-10 MED ORDER — GLYCOPYRROLATE 0.2 MG/ML IJ SOLN
INTRAMUSCULAR | Status: AC
  Filled 2015-05-10: qty 1

## 2015-05-10 MED ORDER — ALBUTEROL SULFATE HFA 108 (90 BASE) MCG/ACT IN AERS
INHALATION_SPRAY | RESPIRATORY_TRACT | Status: AC
  Filled 2015-05-10: qty 6.7

## 2015-05-10 MED ORDER — HYDROCODONE-ACETAMINOPHEN 5-325 MG PO TABS: 1.0000 | ORAL_TABLET | Freq: Four times a day (QID) | ORAL | Status: DC | PRN

## 2015-05-10 MED ORDER — PROPOFOL 10 MG/ML IV BOLUS
INTRAVENOUS | Status: AC
  Filled 2015-05-10: qty 20

## 2015-05-10 MED ORDER — PHENYLEPHRINE 40 MCG/ML (10ML) SYRINGE FOR IV PUSH (FOR BLOOD PRESSURE SUPPORT)
PREFILLED_SYRINGE | INTRAVENOUS | Status: AC
  Filled 2015-05-10: qty 10

## 2015-05-10 MED ORDER — HYDROCORTISONE NA SUCCINATE PF 100 MG IJ SOLR
INTRAMUSCULAR | Status: DC | PRN
  Administered 2015-05-10: 100 mg via INTRAVENOUS

## 2015-05-10 MED ORDER — EPHEDRINE SULFATE 50 MG/ML IJ SOLN
INTRAMUSCULAR | Status: AC
  Filled 2015-05-10: qty 1

## 2015-05-10 MED ORDER — LIDOCAINE HCL (CARDIAC) 20 MG/ML IV SOLN
INTRAVENOUS | Status: AC
  Filled 2015-05-10: qty 5

## 2015-05-10 MED ORDER — PHENYLEPHRINE HCL 10 MG/ML IJ SOLN
INTRAMUSCULAR | Status: DC | PRN
  Administered 2015-05-10: 80 ug via INTRAVENOUS
  Administered 2015-05-10: 40 ug via INTRAVENOUS
  Administered 2015-05-10: 120 ug via INTRAVENOUS
  Administered 2015-05-10: 40 ug via INTRAVENOUS
  Administered 2015-05-10 (×2): 80 ug via INTRAVENOUS
  Administered 2015-05-10: 40 ug via INTRAVENOUS

## 2015-05-10 MED ORDER — CEFAZOLIN SODIUM-DEXTROSE 2-3 GM-% IV SOLR
INTRAVENOUS | Status: DC | PRN
  Administered 2015-05-10: 2 g via INTRAVENOUS

## 2015-05-10 MED ORDER — ACETAMINOPHEN 325 MG PO TABS: 650.0000 mg | ORAL_TABLET | ORAL | Status: DC | PRN

## 2015-05-10 MED ORDER — AMLODIPINE BESYLATE 5 MG PO TABS
5.0000 mg | ORAL_TABLET | Freq: Every day | ORAL | Status: DC
  Administered 2015-05-10: 5 mg via ORAL
  Filled 2015-05-10: qty 1

## 2015-05-10 MED ORDER — HYDROMORPHONE HCL 1 MG/ML IJ SOLN
0.5000 mg | INTRAMUSCULAR | Status: DC | PRN
  Administered 2015-05-10 (×2): 1 mg via INTRAVENOUS
  Filled 2015-05-10 (×2): qty 1

## 2015-05-10 MED ORDER — ONDANSETRON HCL 4 MG/2ML IJ SOLN
INTRAMUSCULAR | Status: DC | PRN
  Administered 2015-05-10: 4 mg via INTRAVENOUS

## 2015-05-10 MED ORDER — ONDANSETRON HCL 4 MG/2ML IJ SOLN
INTRAMUSCULAR | Status: AC
  Filled 2015-05-10: qty 2

## 2015-05-10 MED ORDER — SERTRALINE HCL 50 MG PO TABS
150.0000 mg | ORAL_TABLET | Freq: Every day | ORAL | Status: DC
  Administered 2015-05-11: 150 mg via ORAL
  Filled 2015-05-10 (×2): qty 1

## 2015-05-10 MED ORDER — DONEPEZIL HCL 10 MG PO TABS
10.0000 mg | ORAL_TABLET | Freq: Two times a day (BID) | ORAL | Status: DC
  Administered 2015-05-10 – 2015-05-11 (×2): 10 mg via ORAL
  Filled 2015-05-10 (×2): qty 1

## 2015-05-10 MED ORDER — THROMBIN 20000 UNITS EX SOLR
CUTANEOUS | Status: DC | PRN
  Administered 2015-05-10: 12:00:00 via TOPICAL

## 2015-05-10 MED ORDER — CEFAZOLIN SODIUM-DEXTROSE 2-3 GM-% IV SOLR
INTRAVENOUS | Status: AC
  Filled 2015-05-10: qty 50

## 2015-05-10 MED ORDER — FENTANYL CITRATE (PF) 100 MCG/2ML IJ SOLN
INTRAMUSCULAR | Status: DC | PRN
  Administered 2015-05-10: 75 ug via INTRAVENOUS
  Administered 2015-05-10: 50 ug via INTRAVENOUS
  Administered 2015-05-10 (×4): 25 ug via INTRAVENOUS

## 2015-05-10 MED ORDER — LIDOCAINE HCL 4 % MT SOLN
OROMUCOSAL | Status: DC | PRN
  Administered 2015-05-10: 4 mL via TOPICAL

## 2015-05-10 MED ORDER — GLYCOPYRROLATE 0.2 MG/ML IJ SOLN
INTRAMUSCULAR | Status: DC | PRN
  Administered 2015-05-10: 0.1 mg via INTRAVENOUS
  Administered 2015-05-10: 0.4 mg via INTRAVENOUS
  Administered 2015-05-10: 0.1 mg via INTRAVENOUS

## 2015-05-10 MED ORDER — CEFAZOLIN SODIUM 1-5 GM-% IV SOLN
1.0000 g | Freq: Three times a day (TID) | INTRAVENOUS | Status: DC
  Administered 2015-05-10 – 2015-05-11 (×4): 1 g via INTRAVENOUS
  Filled 2015-05-10 (×6): qty 50

## 2015-05-10 MED ORDER — BUPIVACAINE HCL (PF) 0.25 % IJ SOLN
INTRAMUSCULAR | Status: DC | PRN
  Administered 2015-05-10: 10 mL

## 2015-05-10 MED ORDER — SODIUM CHLORIDE 0.9 % IJ SOLN
INTRAMUSCULAR | Status: AC
  Filled 2015-05-10: qty 10

## 2015-05-10 MED ORDER — ACETAMINOPHEN 650 MG RE SUPP: 650.0000 mg | RECTAL | Status: DC | PRN

## 2015-05-10 MED ORDER — PROPOFOL 10 MG/ML IV BOLUS
INTRAVENOUS | Status: DC | PRN
  Administered 2015-05-10: 120 mg via INTRAVENOUS
  Administered 2015-05-10: 40 mg via INTRAVENOUS

## 2015-05-10 MED ORDER — ALBUTEROL SULFATE HFA 108 (90 BASE) MCG/ACT IN AERS
INHALATION_SPRAY | RESPIRATORY_TRACT | Status: DC | PRN
  Administered 2015-05-10: 8 via RESPIRATORY_TRACT
  Administered 2015-05-10: 4 via RESPIRATORY_TRACT

## 2015-05-10 MED ORDER — ALENDRONATE SODIUM 70 MG PO TABS: 70.0000 mg | ORAL_TABLET | ORAL | Status: DC

## 2015-05-10 MED ORDER — ROCURONIUM BROMIDE 50 MG/5ML IV SOLN
INTRAVENOUS | Status: AC
Start: 2015-05-10 — End: 2015-05-10
  Filled 2015-05-10: qty 1

## 2015-05-10 MED ORDER — PREDNISONE 5 MG PO TABS: 5.0000 mg | ORAL_TABLET | Freq: Every day | ORAL | Status: DC

## 2015-05-10 MED ORDER — ONDANSETRON HCL 4 MG/2ML IJ SOLN: 4.0000 mg | Freq: Once | INTRAMUSCULAR | Status: DC | PRN

## 2015-05-10 MED ORDER — OXYCODONE-ACETAMINOPHEN 5-325 MG PO TABS
1.0000 | ORAL_TABLET | ORAL | Status: DC | PRN
  Administered 2015-05-11 (×2): 2 via ORAL
  Filled 2015-05-10 (×2): qty 2

## 2015-05-10 MED ORDER — EPHEDRINE SULFATE 50 MG/ML IJ SOLN
INTRAMUSCULAR | Status: DC | PRN
  Administered 2015-05-10 (×3): 5 mg via INTRAVENOUS
  Administered 2015-05-10: 10 mg via INTRAVENOUS
  Administered 2015-05-10 (×5): 5 mg via INTRAVENOUS

## 2015-05-10 MED ORDER — LIDOCAINE HCL (CARDIAC) 20 MG/ML IV SOLN
INTRAVENOUS | Status: AC
  Filled 2015-05-10: qty 10

## 2015-05-10 MED ORDER — PROPRANOLOL HCL 20 MG PO TABS
20.0000 mg | ORAL_TABLET | Freq: Two times a day (BID) | ORAL | Status: DC | PRN
  Filled 2015-05-10: qty 1

## 2015-05-10 MED ORDER — DOXYCYCLINE HYCLATE 100 MG PO TABS
100.0000 mg | ORAL_TABLET | Freq: Two times a day (BID) | ORAL | Status: DC
  Administered 2015-05-10 – 2015-05-11 (×2): 100 mg via ORAL
  Filled 2015-05-10 (×2): qty 1

## 2015-05-10 MED ORDER — DEXAMETHASONE SODIUM PHOSPHATE 4 MG/ML IJ SOLN
INTRAMUSCULAR | Status: AC
  Filled 2015-05-10: qty 1

## 2015-05-10 MED ORDER — BUPROPION HCL ER (XL) 150 MG PO TB24
300.0000 mg | ORAL_TABLET | Freq: Every day | ORAL | Status: DC
  Administered 2015-05-10: 300 mg via ORAL
  Filled 2015-05-10: qty 2

## 2015-05-10 MED ORDER — FENTANYL CITRATE (PF) 250 MCG/5ML IJ SOLN
INTRAMUSCULAR | Status: AC
  Filled 2015-05-10: qty 5

## 2015-05-10 MED ORDER — LACTATED RINGERS IV SOLN
INTRAVENOUS | Status: DC
  Administered 2015-05-10 (×2): via INTRAVENOUS
  Administered 2015-05-11: 1000 mL via INTRAVENOUS

## 2015-05-10 MED ORDER — TRAZODONE HCL 50 MG PO TABS: 50.0000 mg | ORAL_TABLET | Freq: Every evening | ORAL | Status: DC | PRN

## 2015-05-10 MED ORDER — FENTANYL CITRATE (PF) 100 MCG/2ML IJ SOLN: 25.0000 ug | INTRAMUSCULAR | Status: DC | PRN

## 2015-05-10 MED ORDER — ROCURONIUM BROMIDE 100 MG/10ML IV SOLN
INTRAVENOUS | Status: DC | PRN
  Administered 2015-05-10 (×2): 10 mg via INTRAVENOUS
  Administered 2015-05-10: 20 mg via INTRAVENOUS
  Administered 2015-05-10 (×4): 10 mg via INTRAVENOUS
  Administered 2015-05-10: 30 mg via INTRAVENOUS

## 2015-05-10 MED ORDER — BACITRACIN ZINC 500 UNIT/GM EX OINT
TOPICAL_OINTMENT | CUTANEOUS | Status: DC | PRN
  Administered 2015-05-10: 1 via TOPICAL

## 2015-05-10 MED ORDER — DOXYCYCLINE HYCLATE 100 MG PO CPEP
100.0000 mg | ORAL_CAPSULE | Freq: Two times a day (BID) | ORAL | Status: DC
  Filled 2015-05-10: qty 1

## 2015-05-10 MED ORDER — HYDROCODONE-ACETAMINOPHEN 7.5-325 MG PO TABS: 1.0000 | ORAL_TABLET | Freq: Once | ORAL | Status: DC | PRN

## 2015-05-10 MED ORDER — SODIUM CHLORIDE 0.9 % IJ SOLN: 3.0000 mL | Freq: Two times a day (BID) | INTRAMUSCULAR | Status: DC

## 2015-05-10 MED ORDER — ROCURONIUM BROMIDE 50 MG/5ML IV SOLN
INTRAVENOUS | Status: AC
  Filled 2015-05-10: qty 1

## 2015-05-10 MED ORDER — LEFLUNOMIDE 20 MG PO TABS
20.0000 mg | ORAL_TABLET | Freq: Every day | ORAL | Status: DC
  Administered 2015-05-11: 20 mg via ORAL
  Filled 2015-05-10: qty 1

## 2015-05-10 MED ORDER — GLYCOPYRROLATE 0.2 MG/ML IJ SOLN
INTRAMUSCULAR | Status: AC
Start: 2015-05-10 — End: 2015-05-10
  Filled 2015-05-10: qty 2

## 2015-05-10 MED ORDER — PHENYLEPHRINE HCL 10 MG/ML IJ SOLN
10.0000 mg | INTRAVENOUS | Status: DC | PRN
  Administered 2015-05-10: 20 ug/min via INTRAVENOUS

## 2015-05-10 MED ORDER — LIDOCAINE-EPINEPHRINE 1 %-1:100000 IJ SOLN
INTRAMUSCULAR | Status: DC | PRN
  Administered 2015-05-10: 8 mL

## 2015-05-10 MED ORDER — PRAVASTATIN SODIUM 40 MG PO TABS
40.0000 mg | ORAL_TABLET | Freq: Every day | ORAL | Status: DC
  Administered 2015-05-10: 40 mg via ORAL
  Filled 2015-05-10: qty 1

## 2015-05-10 MED ORDER — SODIUM CHLORIDE 0.9 % IR SOLN
Status: DC | PRN
  Administered 2015-05-10: 12:00:00

## 2015-05-10 MED ORDER — NEOSTIGMINE METHYLSULFATE 10 MG/10ML IV SOLN
INTRAVENOUS | Status: AC
  Filled 2015-05-10: qty 1

## 2015-05-10 MED ORDER — MENTHOL 3 MG MT LOZG: 1.0000 | LOZENGE | OROMUCOSAL | Status: DC | PRN

## 2015-05-10 MED ORDER — METOPROLOL TARTRATE 1 MG/ML IV SOLN
2.0000 mg | Freq: Once | INTRAVENOUS | Status: AC
  Administered 2015-05-10: 2 mg via INTRAVENOUS
  Filled 2015-05-10 (×2): qty 5

## 2015-05-10 MED ORDER — GLYCOPYRROLATE 0.2 MG/ML IJ SOLN
INTRAMUSCULAR | Status: AC
  Filled 2015-05-10: qty 2

## 2015-05-10 MED ORDER — PREDNISONE 5 MG PO TABS
5.0000 mg | ORAL_TABLET | ORAL | Status: DC
  Administered 2015-05-11: 5 mg via ORAL
  Filled 2015-05-10: qty 1

## 2015-05-10 MED ORDER — LIDOCAINE HCL (CARDIAC) 20 MG/ML IV SOLN
INTRAVENOUS | Status: DC | PRN
  Administered 2015-05-10: 40 mg via INTRAVENOUS

## 2015-05-10 MED ORDER — ONDANSETRON HCL 4 MG/2ML IJ SOLN: 4.0000 mg | INTRAMUSCULAR | Status: DC | PRN

## 2015-05-10 MED ORDER — CYCLOBENZAPRINE HCL 10 MG PO TABS: 10.0000 mg | ORAL_TABLET | Freq: Three times a day (TID) | ORAL | Status: DC | PRN

## 2015-05-10 MED ORDER — PREDNISONE 5 MG PO TABS: 10.0000 mg | ORAL_TABLET | ORAL | Status: DC

## 2015-05-10 MED ORDER — PHENOL 1.4 % MT LIQD: 1.0000 | OROMUCOSAL | Status: DC | PRN

## 2015-05-10 MED ORDER — ROCURONIUM BROMIDE 50 MG/5ML IV SOLN
INTRAVENOUS | Status: AC
  Filled 2015-05-10: qty 2

## 2015-05-10 MED ORDER — ARTIFICIAL TEARS OP OINT
TOPICAL_OINTMENT | OPHTHALMIC | Status: DC | PRN
  Administered 2015-05-10: 1 via OPHTHALMIC

## 2015-05-10 MED ORDER — SODIUM CHLORIDE 0.9 % IJ SOLN: 3.0000 mL | INTRAMUSCULAR | Status: DC | PRN

## 2015-05-10 MED ORDER — SODIUM CHLORIDE 0.9 % IV SOLN: 250.0000 mL | INTRAVENOUS | Status: DC

## 2015-05-10 SURGICAL SUPPLY — 77 items
ALLOSTEM STRIP 20MMX50MM (Tissue) ×3 IMPLANT
BAG DECANTER FOR FLEXI CONT (MISCELLANEOUS) ×3 IMPLANT
BENZOIN TINCTURE PRP APPL 2/3 (GAUZE/BANDAGES/DRESSINGS) ×6 IMPLANT
BIT DRILL 2.4XNS REUSE 3.5X (BIT) ×1 IMPLANT
BIT DRL 2.4XNS REUSE 3.5X (BIT) ×1
BLADE CLIPPER SURG (BLADE) ×3 IMPLANT
BLADE SURG 11 STRL SS (BLADE) ×3 IMPLANT
BONE ALLOSTEM MORSELIZED 5CC (Bone Implant) ×3 IMPLANT
BUR MATCHSTICK NEURO 3.0 LAGG (BURR) ×3 IMPLANT
CANISTER SUCT 3000ML PPV (MISCELLANEOUS) ×3 IMPLANT
CAP ELLIPSE LOCKING (Cap) ×15 IMPLANT
CLOSURE WOUND 1/2 X4 (GAUZE/BANDAGES/DRESSINGS) ×2
CONT SPEC 4OZ CLIKSEAL STRL BL (MISCELLANEOUS) ×6 IMPLANT
DECANTER SPIKE VIAL GLASS SM (MISCELLANEOUS) ×3 IMPLANT
DERMABOND ADVANCED (GAUZE/BANDAGES/DRESSINGS) ×2
DERMABOND ADVANCED .7 DNX12 (GAUZE/BANDAGES/DRESSINGS) ×1 IMPLANT
DRAPE C-ARM 42X72 X-RAY (DRAPES) ×6 IMPLANT
DRAPE LAPAROTOMY 100X72 PEDS (DRAPES) ×3 IMPLANT
DRAPE MICROSCOPE LEICA (MISCELLANEOUS) ×6 IMPLANT
DRAPE POUCH INSTRU U-SHP 10X18 (DRAPES) ×3 IMPLANT
DRAPE SURG 17X23 STRL (DRAPES) ×6 IMPLANT
DRILL BIT 3.5 (BIT) ×2
DRSG OPSITE 4X5.5 SM (GAUZE/BANDAGES/DRESSINGS) ×3 IMPLANT
DRSG OPSITE POSTOP 4X6 (GAUZE/BANDAGES/DRESSINGS) ×3 IMPLANT
DURAPREP 26ML APPLICATOR (WOUND CARE) ×3 IMPLANT
ELECT REM PT RETURN 9FT ADLT (ELECTROSURGICAL) ×3
ELECTRODE REM PT RTRN 9FT ADLT (ELECTROSURGICAL) ×1 IMPLANT
EVACUATOR 1/8 PVC DRAIN (DRAIN) ×3 IMPLANT
GAUZE SPONGE 4X4 12PLY STRL (GAUZE/BANDAGES/DRESSINGS) ×3 IMPLANT
GAUZE SPONGE 4X4 16PLY XRAY LF (GAUZE/BANDAGES/DRESSINGS) IMPLANT
GLOVE BIO SURGEON STRL SZ 6.5 (GLOVE) ×6 IMPLANT
GLOVE BIO SURGEON STRL SZ7 (GLOVE) ×9 IMPLANT
GLOVE BIO SURGEON STRL SZ8 (GLOVE) ×6 IMPLANT
GLOVE BIO SURGEONS STRL SZ 6.5 (GLOVE) ×3
GLOVE BIOGEL M 7.0 STRL (GLOVE) IMPLANT
GLOVE BIOGEL PI IND STRL 6.5 (GLOVE) ×1 IMPLANT
GLOVE BIOGEL PI IND STRL 7.5 (GLOVE) ×4 IMPLANT
GLOVE BIOGEL PI INDICATOR 6.5 (GLOVE) ×2
GLOVE BIOGEL PI INDICATOR 7.5 (GLOVE) ×8
GLOVE ECLIPSE 6.5 STRL STRAW (GLOVE) ×6 IMPLANT
GLOVE EXAM NITRILE LRG STRL (GLOVE) IMPLANT
GLOVE EXAM NITRILE MD LF STRL (GLOVE) IMPLANT
GLOVE EXAM NITRILE XL STR (GLOVE) IMPLANT
GLOVE EXAM NITRILE XS STR PU (GLOVE) IMPLANT
GLOVE INDICATOR 8.5 STRL (GLOVE) ×6 IMPLANT
GOWN STRL REUS W/ TWL LRG LVL3 (GOWN DISPOSABLE) ×5 IMPLANT
GOWN STRL REUS W/ TWL XL LVL3 (GOWN DISPOSABLE) ×2 IMPLANT
GOWN STRL REUS W/TWL 2XL LVL3 (GOWN DISPOSABLE) IMPLANT
GOWN STRL REUS W/TWL LRG LVL3 (GOWN DISPOSABLE) ×10
GOWN STRL REUS W/TWL XL LVL3 (GOWN DISPOSABLE) ×4
KIT BASIN OR (CUSTOM PROCEDURE TRAY) ×3 IMPLANT
KIT INFUSE XX SMALL 0.7CC (Orthopedic Implant) ×3 IMPLANT
KIT ROOM TURNOVER OR (KITS) ×3 IMPLANT
LIQUID BAND (GAUZE/BANDAGES/DRESSINGS) IMPLANT
MARKER SKIN DUAL TIP RULER LAB (MISCELLANEOUS) ×6 IMPLANT
NEEDLE HYPO 25X1 1.5 SAFETY (NEEDLE) ×3 IMPLANT
NEEDLE SPNL 20GX3.5 QUINCKE YW (NEEDLE) ×3 IMPLANT
NS IRRIG 1000ML POUR BTL (IV SOLUTION) ×3 IMPLANT
PACK LAMINECTOMY NEURO (CUSTOM PROCEDURE TRAY) ×3 IMPLANT
PAD ARMBOARD 7.5X6 YLW CONV (MISCELLANEOUS) ×6 IMPLANT
PIN MAYFIELD SKULL DISP (PIN) ×3 IMPLANT
ROD 3.5X240MM (Rod) ×3 IMPLANT
RUBBERBAND STERILE (MISCELLANEOUS) ×12 IMPLANT
SCREW 3.5X16 (Screw) ×12 IMPLANT
SPONGE LAP 4X18 X RAY DECT (DISPOSABLE) IMPLANT
SPONGE SURGIFOAM ABS GEL 100 (HEMOSTASIS) ×3 IMPLANT
STRIP CLOSURE SKIN 1/2X4 (GAUZE/BANDAGES/DRESSINGS) ×4 IMPLANT
SUT ETHILON 4 0 PS 2 18 (SUTURE) IMPLANT
SUT VIC AB 0 CT1 18XCR BRD8 (SUTURE) ×1 IMPLANT
SUT VIC AB 0 CT1 8-18 (SUTURE) ×2
SUT VIC AB 2-0 CT1 18 (SUTURE) ×3 IMPLANT
SUT VICRYL 4-0 PS2 18IN ABS (SUTURE) ×3 IMPLANT
SYR 20ML ECCENTRIC (SYRINGE) ×3 IMPLANT
TOWEL OR 17X24 6PK STRL BLUE (TOWEL DISPOSABLE) ×3 IMPLANT
TOWEL OR 17X26 10 PK STRL BLUE (TOWEL DISPOSABLE) ×3 IMPLANT
TRAY FOLEY CATH 14FRSI W/METER (CATHETERS) ×3 IMPLANT
WATER STERILE IRR 1000ML POUR (IV SOLUTION) ×3 IMPLANT

## 2015-05-10 NOTE — Progress Notes (Signed)
Pt arrived from PACU at 16:13pm alert, verbal with no noted distress. Cervical collar in place on and aligned. Surgical dressing site clean dry and intact. Hemovac in place. Foley patent draining clear, yellow urine. IV infusing, transparent dressing dry and intact. Pt stable, neuro intact. She does complain of pain 9/10 sharp pain to surgical site. Pt assists with reposiitioning, moving all extremities. Safety measures in place. Call bell within reach. Will continue to monitor.

## 2015-05-10 NOTE — Anesthesia Preprocedure Evaluation (Addendum)
Anesthesia Evaluation  Patient identified by MRN, date of birth, ID band Patient awake    Reviewed: Allergy & Precautions, NPO status , Patient's Chart, lab work & pertinent test results, reviewed documented beta blocker date and time   Airway Mallampati: II  TM Distance: >3 FB Neck ROM: Limited    Dental   Pulmonary COPD COPD inhaler, Current Smoker,  + rhonchi         Cardiovascular hypertension, Pt. on medications and Pt. on home beta blockers Rhythm:Regular Rate:Normal     Neuro/Psych Depression Cervical radiculopathy +mild dementia    GI/Hepatic negative GI ROS, Neg liver ROS,   Endo/Other  negative endocrine ROS  Renal/GU Renal InsufficiencyRenal disease     Musculoskeletal  (+) Arthritis -, Rheumatoid disorders and on steriods ,    Abdominal   Peds  Hematology  (+) anemia ,   Anesthesia Other Findings   Reproductive/Obstetrics                            Lab Results  Component Value Date   WBC 6.1 05/09/2015   HGB 11.4* 05/09/2015   HCT 35.0* 05/09/2015   MCV 93.3 05/09/2015   PLT 272 05/09/2015   Lab Results  Component Value Date   CREATININE 1.32* 05/09/2015   BUN 16 05/09/2015   NA 140 05/09/2015   K 3.9 05/09/2015   CL 105 05/09/2015   CO2 26 05/09/2015    Anesthesia Physical Anesthesia Plan  ASA: III  Anesthesia Plan: General   Post-op Pain Management:    Induction: Intravenous  Airway Management Planned: Oral ETT and Video Laryngoscope Planned  Additional Equipment:   Intra-op Plan:   Post-operative Plan: Extubation in OR and Possible Post-op intubation/ventilation  Informed Consent: I have reviewed the patients History and Physical, chart, labs and discussed the procedure including the risks, benefits and alternatives for the proposed anesthesia with the patient or authorized representative who has indicated his/her understanding and acceptance.    Dental advisory given  Plan Discussed with: CRNA  Anesthesia Plan Comments:         Anesthesia Quick Evaluation

## 2015-05-10 NOTE — H&P (Signed)
Marcia Lynch is an 81 y.o. female.   Chief Complaint: Neck and right arm pain HPI: Patient is a very pleasant 81 year old female who previously undergone a C3-C7 posterior cervical decompression and fusion and over the last weeks and months is a progress worsening right shoulder and arm pain and weakness workup has revealed solid fusion from C3-C7 however there was some loosening of her C7 screws however a very large disc herniation at C7-T1 with a large fragment migrating behind the C7 vertebral body was also visualized. Due to patient's failure conservative treatment significant weakness on clinical exam and imaging findings I recommended a decompression and fusion C7-T1 as well as an exploration of fusion with possible removal of hardware C3-C7. I've extensively gone over the risks and benefits of the operation with the patient as well as perioperative course expectations of outcome and alternatives of surgery and she understands and agrees to proceed forward.  Past Medical History  Diagnosis Date  . Headache(784.0)   . Depression     TAKING ZOLOFT FOR A LONG TIME  . Dementia     STARTING SIGNS OF DEMENTIA  . Hypertension   . Cough     loose  started 05/05/15 no fever  . Arthritis   . Rheumatoid arthritis     Past Surgical History  Procedure Laterality Date  . Fracture surgery      RIGHT HIP-HAS SCREW  . Rotator cuff repair      RIGHT SHOULDER  . Shoulder arthroscopy      ? LEFT BONE SPURSS  . Carpal tunnel release      RIGHT  . Eye surgery      BILATERAL CATARACTS  . Posterior cervical fusion/foraminotomy N/A 09/20/2013    Procedure: CERVICAL THREE TO CERVICAL SEVEN  POSTERIOR CERVICAL FUSION;  Surgeon: Elaina Hoops, MD;  Location: [place] [facility];  Service: Neurosurgery;  Laterality: N/A;  CERVICAL THREE TO CERVICAL SEVEN  POSTERIOR CERVICAL FUSION    History reviewed. No pertinent family history. Social History:  reports that she has been smoking Cigarettes.  She has a 17  pack-year smoking history. She does not have any smokeless tobacco history on file. She reports that she does not drink alcohol or use illicit drugs.  Allergies:  Allergies  Allergen Reactions  . Cymbalta [Duloxetine Hcl] Rash    Medications Prior to Admission  Medication Sig Dispense Refill  . alendronate (FOSAMAX) 70 MG tablet Take 70 mg by mouth every 7 (seven) days. Take with a full glass of water on an empty stomach.  On Sunday    . amLODipine (NORVASC) 5 MG tablet Take 5 mg by mouth at bedtime.   11  . buPROPion (WELLBUTRIN XL) 300 MG 24 hr tablet Take 300 mg by mouth at bedtime.   3  . calcium carbonate (OS-CAL) 600 MG TABS tablet Take 600 mg by mouth 2 (two) times daily with a meal.    . cyclobenzaprine (FLEXERIL) 10 MG tablet Take 1 tablet (10 mg total) by mouth 3 (three) times daily as needed for muscle spasms. 80 tablet 1  . donepezil (ARICEPT) 10 MG tablet Take 10 mg by mouth 2 (two) times daily.     Marland Kitchen doxycycline (DORYX) 100 MG DR capsule Take 100 mg by mouth 2 (two) times daily. 15 day course started 05/04/15    . HYDROcodone-acetaminophen (NORCO/VICODIN) 5-325 MG per tablet Take 1 tablet by mouth every 6 (six) hours as needed (pain).   0  . leflunomide (ARAVA) 20 MG  tablet Take 20 mg by mouth daily.  3  . Multiple Vitamin (MULTIVITAMIN WITH MINERALS) TABS tablet Take 1 tablet by mouth daily.    . pravastatin (PRAVACHOL) 40 MG tablet Take 40 mg by mouth at bedtime.   11  . predniSONE (DELTASONE) 10 MG tablet Take 5-10 mg by mouth daily. Take 1 tablet (10 mg) on Monday, Wednesday, Friday, take 1/2 tablet (5 mg) on Sunday, Tuesday, Thursday, Saturday  2  . propranolol (INDERAL) 20 MG tablet Take 20 mg by mouth 2 (two) times daily as needed (shaking of hands).   11  . Pseudoephedrine-APAP-DM 30-325-15 MG/15ML LIQD Take by mouth 1 day or 1 dose.    . sertraline (ZOLOFT) 100 MG tablet Take 150 mg by mouth daily.     . traZODone (DESYREL) 50 MG tablet Take 50-100 mg by mouth at  bedtime as needed for sleep.   2  . Fexofenadine HCl (MUCINEX ALLERGY PO) Take by mouth every 12 (twelve) hours as needed.    Marland Kitchen oxyCODONE-acetaminophen (PERCOCET/ROXICET) 5-325 MG per tablet Take 1-2 tablets by mouth every 4 (four) hours as needed. (Patient not taking: Reported on 05/05/2015) 80 tablet 0  . oxyCODONE-acetaminophen (PERCOCET/ROXICET) 5-325 MG per tablet Take 1-2 tablets by mouth every 4 (four) hours as needed. (Patient not taking: Reported on 05/05/2015) 60 tablet 0    Results for orders placed or performed during the hospital encounter of 05/09/15 (from the past 48 hour(s))  ABO/Rh     Status: None   Collection Time: 05/09/15  3:37 PM  Result Value Ref Range   ABO/RH(D) A NEG    Dg Chest 2 View  05/09/2015   CLINICAL DATA:  Preoperative exam prior to lumbar surgery, history of tobacco use and recent onset of productive cough  EXAM: CHEST  2 VIEW  COMPARISON:  Portable chest x-ray of November 03, 2014  FINDINGS: The lungs are hyperinflated. There is no focal infiltrate. There are scattered subcentimeter calcified nodules which are stable. The heart and pulmonary vascularity are normal. There is no pleural effusion. The trachea is midline. There is calcification in the wall of the aortic arch. The bony thorax is unremarkable. There prosthetic shoulder joints bilaterally.  IMPRESSION: COPD and evidence of previous granulomatous infection. There is no active cardiopulmonary disease.   Electronically Signed   By: David  Martinique M.D.   On: 05/09/2015 16:51    Review of Systems  Constitutional: Negative.   HENT: Negative.   Eyes: Negative.   Respiratory: Negative.   Cardiovascular: Negative.   Gastrointestinal: Negative.   Genitourinary: Negative.   Musculoskeletal: Positive for myalgias and back pain.  Skin: Negative.   Neurological: Positive for tingling and sensory change.  Psychiatric/Behavioral: Negative.     Blood pressure 126/70, pulse 73, temperature 98.2 F (36.8 C),  temperature source Oral, resp. rate 16, height '5\' 4"'$  (1.626 m), weight 42.638 kg (94 lb), SpO2 95 %. Physical Exam  Constitutional: She is oriented to person, place, and time. She appears well-developed and well-nourished.  HENT:  Head: Normocephalic.  Eyes: Pupils are equal, round, and reactive to light.  Neck: Normal range of motion.  Respiratory: Effort normal.  GI: Soft. Bowel sounds are normal.  Neurological: She is alert and oriented to person, place, and time. She has normal strength.  Strength is 5 out of 5 in her left upper extremity and deltoid, bicep, tricep, wrist flexion, wrist extension, and intrinsics. Right upper extremity is weakness in the right tricep 4 out of 5 weakness  in her hand intrinsics at 4 out of 5. Lower extremity strength is 5 out of 5  Skin: Skin is warm and dry.     Assessment/Plan 81 year old female presents for an expiration of fusion removal of hardware C3-C7 and a posterior cervical decompression and fusion C7-T1.  Marcia Lynch P 05/10/2015, 10:03 AM

## 2015-05-10 NOTE — Anesthesia Procedure Notes (Signed)
Procedure Name: Intubation Date/Time: 05/10/2015 10:54 AM Performed by: ,  W Pre-anesthesia Checklist: Timeout performed, Patient identified, Emergency Drugs available, Suction available and Patient being monitored Patient Re-evaluated:Patient Re-evaluated prior to inductionOxygen Delivery Method: Circle system utilized Preoxygenation: Pre-oxygenation with 100% oxygen Intubation Type: IV induction Ventilation: Mask ventilation without difficulty and Oral airway inserted - appropriate to patient size Grade View: Grade I Tube size: 7.0 mm Number of attempts: 1 Airway Equipment and Method: Video-laryngoscopy and LTA kit utilized Placement Confirmation: ETT inserted through vocal cords under direct vision,  breath sounds checked- equal and bilateral,  positive ETCO2 and CO2 detector Secured at: 22 cm Tube secured with: Tape Dental Injury: Teeth and Oropharynx as per pre-operative assessment  Comments: Elective glidescope >> Limited neck mobility 2/2 previous cervical fusion. RUE radiculopathy present at baseline.     

## 2015-05-10 NOTE — Progress Notes (Signed)
Orthopedic Tech Progress Note Patient Details:  Marcia Lynch 07-26-1931 [id number] Delivered soft cervical collar to pt.'s nurse. Patient ID: Riyah Bardon, female   DOB: November 08, 1931, 81 y.o.   MRN: [id number]   Darrol Poke 05/10/2015, 2:17 PM

## 2015-05-10 NOTE — Op Note (Signed)
Preoperative diagnosis: Herniated nucleus pulposus C7-T1 with instability C7-T1 and right C8 radiculopathy  Postoperative diagnosis: Same  Procedure: Decompressive cervical laminectomy C7-T1 with microscopic discectomy from the right at C7-T1 with microscopic foraminotomies of the C8 nerve root.  #2 exploration of fusion removal of hardware C3-C7  #3 posterior cervical fusion with pedicle screws C7-T1 using the globus ellipse lateral mass and pedicle screw system  #4 posterior lateral arthrodesis C7-T1 using locally harvested autograft mixed with allostem morsels and strips and BMP  surgeon: Dominica Severin Ashante Snelling  Asst.: Dayton Bailiff  Anesthesia: Gen.  EBL: 200  History of present illness: Patient is a very pleasant 81 year old female is a progress worsening neck and right shoulder and arm pain numbness tingling and weakness consistent with a C8 nerve root pattern workup revealed a very large disc herniation at C7-T1 with severe compression of the C8 nerve root. Due to patient's progression of clinical syndrome imaging findings and failure conservative treatment I recommended decompressive cervical laminectomy and discectomy from the right C7-T1 with an exploration of fusion removal of hardware C3-C7 and posterior lateral fusion C7-T1.  Operative procedure: Patient brought into the or was induced under general anesthesia positioned prone on flat rolls in pins posterior aspect of her neck was prepped and draped in routine sterile fashion her old incision was opened up after infiltration of 10 mL lidocaine with epi and subperiosteal dissections care lamina of C7 and T1 the hardware was exposed from C3-C7. The fusion did appear to be solid so I removed the screws everywhere except for the left C5 and the left C7 screw. The left C5 screw was stripped and I couldn't get it out and the left C7 lateral mass screw was solid and I planned on anchor into it. So the spinous processes C7 was removed central  decompression was begun marching laterally drilled through the facet complex identified the left C8 foramen track the left C8 nerve root out the foramen. Then marking superiorly identified both the C7 pedicle as well as inferiorly this T1 pedicle working underneath the thecal sac above the C8 nerve root I removed several large free fragments of disc that had migrated from the disc space superiorly behind the C7 vertebral body. After extensive amount of fragments was removed there was a moderate amount stenosis and the C8 foramen cleaned out from underneath the C8 foramen and tracked the C8 nerve root out mixture of the C8 nerve root was widely decompressed. After adequate decompression achieved the digital laminotomy on the left to expose the T1 pedicle. Then using comminution of AP lateral fluoroscopy and direct visualization pilot holes were drilled in the pedicles of C7-T1 on the right and T1 the left. Pedicles were drilled probed and tapped near cortex and 316 mm 3.5 mm screws were placed in the pedicles at C7 and T1. Screws and excellent purchase postop fluoroscopy confirmed good position the implants. Rods were then cut fashion and anchored down in place. The lateral masses were then decorticated local are autograft mixed with BMP was impacted posterior laterally Gelfoam was overlaid top of the dura the foraminal reinspected confirm patency wounds and closed in layers after placement of a medium Hemovac drain with interrupted Vicryl skin was closed running 4 subcuticular benzoin and Steri-Strips are applied patient recovered in stable condition. At the end of case on it counts sponge counts were correct.

## 2015-05-10 NOTE — Anesthesia Postprocedure Evaluation (Signed)
  Anesthesia Post-op Note  Patient: Marcia Lynch  Procedure(s) Performed: Procedure(s): Posterior Cervical Laminectomy Diskectomy and Fusion Cervical seven Thoracic one. Exploration of Fusion Removal of Hardware Cervical three to seven (N/A)  Patient Location: PACU  Anesthesia Type:General  Level of Consciousness: awake and alert   Airway and Oxygen Therapy: Patient Spontanous Breathing  Post-op Pain: mild  Post-op Assessment: Post-op Vital signs reviewed  Post-op Vital Signs: Reviewed  Last Vitals:  Filed Vitals:   05/10/15 1534  BP:   Pulse: 96  Temp: 36.7 C  Resp: 17    Complications: No apparent anesthesia complications

## 2015-05-10 NOTE — Transfer of Care (Signed)
Immediate Anesthesia Transfer of Care Note  Patient: Marcia Lynch  Procedure(s) Performed: Procedure(s): Posterior Cervical Laminectomy Diskectomy and Fusion Cervical seven Thoracic one. Exploration of Fusion Removal of Hardware Cervical three to seven (N/A)  Patient Location: PACU  Anesthesia Type:General  Level of Consciousness: sedated and responds to stimulation  Airway & Oxygen Therapy: Patient Spontanous Breathing and Patient connected to nasal cannula oxygen  Post-op Assessment: Report given to RN and Post -op Vital signs reviewed and stable  Post vital signs: Reviewed and stable  Last Vitals:  Filed Vitals:   05/10/15 0749  BP: 126/70  Pulse: 73  Temp: 36.8 C  Resp: 16    Complications: No apparent anesthesia complications

## 2015-05-10 NOTE — Progress Notes (Signed)
PHARMACIST - PHYSICIAN COMMUNICATION  CONCERNING: P&T Medication Policy Regarding Oral Bisphosphonates  RECOMMENDATION: Your order for alendronate (Fosamax), ibandronate (Boniva), or risedronate (Actonel) has been discontinued at this time.  If the patient's post-hospital medical condition warrants safe use of this class of drugs, please resume the pre-hospital regimen upon discharge.  DESCRIPTION:  Alendronate (Fosamax), ibandronate (Boniva), and risedronate (Actonel) can cause severe esophageal erosions in patients who are unable to remain upright at least 30 minutes after taking this medication.   Since brief interruptions in therapy are thought to have minimal impact on bone mineral density, the [place] has established that bisphosphonate orders should be routinely discontinued during hospitalization.   To override this safety policy and permit administration of Boniva, Fosamax, or Actonel in the hospital, prescribers must write "DO NOT HOLD" in the comments section when placing the order for this class of medications.  Elenor Quinones, PharmD Clinical Pharmacist Resident Pager [phone] 05/10/2015 4:33 PM

## 2015-05-11 ENCOUNTER — Encounter (HOSPITAL_COMMUNITY): Payer: Self-pay | Admitting: Neurosurgery

## 2015-05-11 MED ORDER — OXYCODONE-ACETAMINOPHEN 5-325 MG PO TABS: 1.0000 | ORAL_TABLET | ORAL | Status: DC | PRN

## 2015-05-11 NOTE — Discharge Instructions (Signed)
No bending no twisting no driving a riding a car unless she is coming back and forth to see me. Keep incision clean dry and intact.

## 2015-05-11 NOTE — Progress Notes (Signed)
Subjective: Patient reports Doing well arm feels better condition of neck and shoulder pain  Objective: Vital signs in last 24 hours: Temp:  [97.6 F (36.4 C)-98.8 F (37.1 C)] 98.1 F (36.7 C) (05/19 0539) Pulse Rate:  [73-125] 81 (05/19 0539) Resp:  [13-23] 16 (05/19 0539) BP: (107-144)/(57-83) 134/68 mmHg (05/19 0539) SpO2:  [95 %-100 %] 97 % (05/19 0539) Weight:  [42.638 kg (94 lb)] 42.638 kg (94 lb) (05/18 0749)  Intake/Output from previous day: 05/18 0701 - 05/19 0700 In: 2150 [I.V.:2150] Out: 1640 [Urine:1340; Drains:100; Blood:200] Intake/Output this shift:    Strength stable at 4 and 5 in the right upper extremity and intrinsics weak tricep week left upper extremity 5 out of 5 incision clean dry and intact  Lab Results:  Recent Labs  05/09/15 1536  WBC 6.1  HGB 11.4*  HCT 35.0*  PLT 272   BMET  Recent Labs  05/09/15 1536  NA 140  K 3.9  CL 105  CO2 26  GLUCOSE 149*  BUN 16  CREATININE 1.32*  CALCIUM 8.8*    Studies/Results: Dg Chest 2 View  05/09/2015   CLINICAL DATA:  Preoperative exam prior to lumbar surgery, history of tobacco use and recent onset of productive cough  EXAM: CHEST  2 VIEW  COMPARISON:  Portable chest x-ray of November 03, 2014  FINDINGS: The lungs are hyperinflated. There is no focal infiltrate. There are scattered subcentimeter calcified nodules which are stable. The heart and pulmonary vascularity are normal. There is no pleural effusion. The trachea is midline. There is calcification in the wall of the aortic arch. The bony thorax is unremarkable. There prosthetic shoulder joints bilaterally.  IMPRESSION: COPD and evidence of previous granulomatous infection. There is no active cardiopulmonary disease.   Electronically Signed   By: David  Martinique M.D.   On: 05/09/2015 16:51   Dg Cervical Spine 2-3 Views  05/10/2015   CLINICAL DATA:  Elective surgery.  EXAM: CERVICAL SPINE - 2-3 VIEW; DG C-ARM 61-120 MIN  COMPARISON:  MRI of Apr 29, 2015.  FLUOROSCOPY TIME:  24 seconds.  FINDINGS: Two intraoperative fluoroscopic images were obtained of the cervical spine. These images demonstrate the patient to be status post posterior screw placement of lower cervical spine for fusion purposes.  IMPRESSION: Status post posterior fusion of lower cervical spine.   Electronically Signed   By: Marijo Conception, M.D.   On: 05/10/2015 14:17   Dg C-arm 1-60 Min  05/10/2015   CLINICAL DATA:  Elective surgery.  EXAM: CERVICAL SPINE - 2-3 VIEW; DG C-ARM 61-120 MIN  COMPARISON:  MRI of Apr 29, 2015.  FLUOROSCOPY TIME:  24 seconds.  FINDINGS: Two intraoperative fluoroscopic images were obtained of the cervical spine. These images demonstrate the patient to be status post posterior screw placement of lower cervical spine for fusion purposes.  IMPRESSION: Status post posterior fusion of lower cervical spine.   Electronically Signed   By: Marijo Conception, M.D.   On: 05/10/2015 14:17    Assessment/Plan: Physical occupational therapy today possible discharged later today or tomorrow  LOS: 1 day     Annely Sliva P 05/11/2015, 7:33 AM

## 2015-05-11 NOTE — Progress Notes (Signed)
Discharge orders received.  Discharge instructions and follow-up appointments reviewed with the patient.  VSS upon discharge.  IV removed and education complete.  Pt refused to wait for home health services to be set up.  Message left for day shift case manager.  Transported out via wheelchair. Cori Razor, RN

## 2015-05-11 NOTE — Clinical Social Work Note (Addendum)
CSW Consult Acknowledged:   CSW received a consult for SNF placement. CSW awaiting PT/OT evaluation to determine the appropriate level of care.      Addendum: Per PT's note the appropriate level of care is home. CSW will sign off.   [place], MSW, [place]

## 2015-05-11 NOTE — Evaluation (Signed)
Occupational Therapy Evaluation Patient Details Name: Marcia Lynch MRN: [id number] DOB: 1931-08-17 Today's Date: 05/11/2015    History of Present Illness pt presents with C7-T1 Lami and Fusion, along with C3-7 hardware removal.     Clinical Impression   Pt. Stated they were unaware of purpose of neck brace. Education provided on neck precautions, purpose of brace, and how to maintain precautions when don/doff brace for bathing. Pt. Demonstrated ability to don brace, with cuing to maintain precautions. Pt. Reported lack of sensation in R hand, digits 2-4, which affects her ability to open containers during mealtime. Education provided on home safety with decreased sensation during ADLs and meal prep. Pt. Walked to therapy gym, drifting to the right often; pt. Was unaware that this occurred but reported falling at home and bruising right arm in the past. Pt. Would benefit from [facility] for home safety.    Follow Up Recommendations  Home health OT    Equipment Recommendations  None recommended by OT    Recommendations for Other Services       Precautions / Restrictions Precautions Precautions: Cervical Required Braces or Orthoses: Cervical Brace Cervical Brace: Soft collar;For comfort Restrictions Weight Bearing Restrictions: No      Mobility Bed Mobility Overal bed mobility: Needs Assistance Bed Mobility: Supine to Sit     Supine to sit: Supervision     General bed mobility comments: cues to try log roll technique for better positioning on neck.    Transfers Overall transfer level: Needs assistance Equipment used: None Transfers: Sit to/from Stand Sit to Stand: Supervision         General transfer comment: Pt. did not use RW during activity    Balance Overall balance assessment: Needs assistance Sitting-balance support: No upper extremity supported;Feet unsupported       Standing balance support: No upper extremity supported Standing balance-Leahy Scale:  Fair                              ADL Overall ADL's : Needs assistance/impaired   Eating/Feeding Details (indicate cue type and reason): pt. reports difficulty opening containers Grooming: Wash/dry hands;Min guard;Cueing for safety                   Toilet Transfer: Regular Toilet;Cueing for safety;Min guard   Toileting- Clothing Manipulation and Hygiene: Sit to/from stand;Supervision/safety       Functional mobility during ADLs: Min guard (pt. drifts to right when walking) General ADL Comments: Education provided on purpose of wearing brace, don/doff neck brace and maintaining precautions when washing neck. Education provided on safety during ADLs due to R hand sensation deficits     Vision Vision Assessment?: No apparent visual deficits   Perception     Praxis      Pertinent Vitals/Pain Pain Assessment: 0-10 Pain Score: 5  Pain Location: neck Pain Descriptors / Indicators: Sore Pain Intervention(s): Patient requesting pain meds-RN notified;Monitored during session     Hand Dominance Right   Extremity/Trunk Assessment Upper Extremity Assessment Upper Extremity Assessment: RUE deficits/detail RUE Deficits / Details: pt. reports numbness in digits 2-4. pt has fine motor and sensation deficits. Education provided on home safety with RUE deficits RUE Sensation: decreased light touch (digits 2-4 only) RUE Coordination: decreased fine motor   Lower Extremity Assessment Lower Extremity Assessment: Overall WFL for tasks assessed   Cervical / Trunk Assessment Cervical / Trunk Assessment: Normal   Communication Communication Communication: No difficulties  Cognition Arousal/Alertness: Awake/alert Behavior During Therapy: WFL for tasks assessed/performed Overall Cognitive Status: Within Functional Limits for tasks assessed                     General Comments       Exercises       Shoulder Instructions      Home Living  Family/patient expects to be discharged to:: Private residence Living Arrangements: Non-relatives/Friends Available Help at Discharge: Friend(s);Available PRN/intermittently Type of Home: House Home Access: Stairs to enter [employer] of Steps: 4 Entrance Stairs-Rails: Right Home Layout: One level     Bathroom Shower/Tub: Teacher, early years/pre: Standard     Home Equipment: None   Additional Comments: at home alone during day while roommate works      Prior Functioning/Environment Level of Independence: Independent             OT Diagnosis: Acute pain   OT Problem List: Decreased safety awareness;Decreased knowledge of use of DME or AE;Decreased knowledge of precautions;Impaired sensation;Pain   OT Treatment/Interventions: Self-care/ADL training;DME and/or AE instruction;Therapeutic activities;Therapeutic exercise;Patient/family education;Balance training    OT Goals(Current goals can be found in the care plan section) Acute Rehab OT Goals Patient Stated Goal: return home OT Goal Formulation: With patient Time For Goal Achievement: 05/25/15 Potential to Achieve Goals: Good  OT Frequency: Min 2X/week   Barriers to D/C:            Co-evaluation              End of Session Equipment Utilized During Treatment: Cervical collar Nurse Communication: Mobility status;Precautions  Activity Tolerance: Patient tolerated treatment well Patient left: in bed;with call bell/phone within reach;with bed alarm set   Time: 1315-1335 OT Time Calculation (min): 20 min Charges:  OT General Charges $OT Visit: 1 Procedure OT Evaluation $Initial OT Evaluation Tier I: 1 Procedure G-Codes:    Forest Gleason 05/11/2015, 2:13 PM

## 2015-05-11 NOTE — Evaluation (Signed)
Physical Therapy Evaluation Patient Details Name: Marcia Lynch MRN: [id number] DOB: [birth year] Today's Date: 05/11/2015   History of Present Illness  pt presents with C7-T1 Lami and Fusion, along with C3-7 hardware removal.    Clinical Impression  Pt moving well, but does get distracted affecting her balance with mobility.  Pt indicates her friend is able to perform homemaking tasks and check on her every evening, but that she would be alone during the day.  Pt would benefit from HHPT for home safety eval.  Will continue to follow while on acute.      Follow Up Recommendations Home health PT;Supervision - Intermittent    Equipment Recommendations  Rolling walker with 5" wheels    Recommendations for Other Services       Precautions / Restrictions Precautions Precautions: Cervical Required Braces or Orthoses: Cervical Brace Cervical Brace: Soft collar;For comfort Restrictions Weight Bearing Restrictions: No      Mobility  Bed Mobility Overal bed mobility: Needs Assistance Bed Mobility: Supine to Sit     Supine to sit: Supervision     General bed mobility comments: cues to try log roll technique for better positioning on neck.    Transfers Overall transfer level: Needs assistance Equipment used: Rolling walker (2 wheeled) Transfers: Sit to/from Stand Sit to Stand: Supervision         General transfer comment: cues for UE use.  pt had RW present with coming to stand, but did not have when returning to sitting.    Ambulation/Gait Ambulation/Gait assistance: Min guard Ambulation Distance (Feet): 400 Feet Assistive device: Rolling walker (2 wheeled);None Gait Pattern/deviations: Step-through pattern;Decreased stride length     General Gait Details: Started ambulating with RW, however pt seemed to be carrying RW and not using.  pt able to ambulate the mojority of the distance with MinG and no AD.  pt does have LOB with distractions or balance challenges such as  negotiating obstacles or performing head turns.    Stairs Stairs: Yes Stairs assistance: Min guard Stair Management: One rail Right;Alternating pattern;Forwards Number of Stairs: 5 General stair comments: pt with definite use of hand rail for support on stairs.  cues to slow down and remain attending to task as pt tends to get distracted.    Wheelchair Mobility    Modified Rankin (Stroke Patients Only)       Balance Overall balance assessment: Needs assistance         Standing balance support: No upper extremity supported;During functional activity Standing balance-Leahy Scale: Fair                               Pertinent Vitals/Pain Pain Assessment: 0-10 Pain Score: 5  Pain Location: Bil Shoulders Pain Descriptors / Indicators: Sore Pain Intervention(s): Monitored during session;Premedicated before session;Repositioned    Home Living Family/patient expects to be discharged to:: Private residence Living Arrangements: Non-relatives/Friends Available Help at Discharge: Friend(s);Available PRN/intermittently Type of Home: House Home Access: Stairs to enter Entrance Stairs-Rails: Right Entrance Stairs-Number of Steps: 4 Home Layout: One level Home Equipment: None      Prior Function Level of Independence: Independent               Hand Dominance        Extremity/Trunk Assessment   Upper Extremity Assessment: Defer to OT evaluation           Lower Extremity Assessment: Overall WFL for tasks assessed  Cervical / Trunk Assessment: Normal  Communication   Communication: No difficulties  Cognition Arousal/Alertness: Awake/alert Behavior During Therapy: WFL for tasks assessed/performed Overall Cognitive Status: Within Functional Limits for tasks assessed                      General Comments      Exercises        Assessment/Plan    PT Assessment Patient needs continued PT services  PT Diagnosis Difficulty  walking   PT Problem List Decreased activity tolerance;Decreased balance;Decreased mobility;Decreased knowledge of use of DME;Decreased knowledge of precautions  PT Treatment Interventions DME instruction;Gait training;Stair training;Functional mobility training;Therapeutic activities;Therapeutic exercise;Balance training;Neuromuscular re-education;Patient/family education   PT Goals (Current goals can be found in the Care Plan section) Acute Rehab PT Goals Patient Stated Goal: Home and no more surgeries.   PT Goal Formulation: With patient Time For Goal Achievement: 05/18/15 Potential to Achieve Goals: Good    Frequency Min 5X/week   Barriers to discharge Decreased caregiver support      Co-evaluation               End of Session Equipment Utilized During Treatment: Gait belt;Cervical collar Activity Tolerance: Patient tolerated treatment well Patient left: in chair;with call bell/phone within reach Nurse Communication: Mobility status         Time: 0051-1021 PT Time Calculation (min) (ACUTE ONLY): 26 min   Charges:   PT Evaluation $Initial PT Evaluation Tier I: 1 Procedure PT Treatments $Gait Training: 8-22 mins   PT G CodesCatarina Hartshorn, [place] 05/11/2015, 1:25 PM

## 2015-05-11 NOTE — Discharge Summary (Signed)
Physician Discharge Summary  Patient ID: Marcia Lynch MRN: [id number] DOB/AGE: February 26, 1931 81 y.o.  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 Postoperatively patient did very well with recovered in the floor on the floor she was convalescing well ambulating and voiding spontaneously tolerating regular diet was stable for discharge home.  Consults: Significant Diagnostic Studies: Treatments: Posterior cervical decompression and fusion and discectomy C7-T1 Discharge Exam: Blood pressure 142/82, pulse 77, temperature 98 F (36.7 C), temperature source Oral, resp. rate 18, height '5\' 4"'$  (1.626 m), weight 42.638 kg (94 lb), SpO2 98 %. Strength out of 5 except for the 5 weakness in right upper extremity  Disposition: Home  Discharge Instructions    Face-to-face encounter (required for Medicare/Medicaid patients)    Complete by:  As directed   I Danyon Mcginness P certify that this patient is under my care and that I, or a nurse practitioner or physician's assistant working with me, had a face-to-face encounter that meets the physician face-to-face encounter requirements with this patient on 05/11/2015. The encounter with the patient was in whole, or in part for the following medical condition(s) which is the primary reason for home health care (List medical condition): cervical myelopathy  The encounter with the patient was in whole, or in part, for the following medical condition, which is the primary reason for home health care:  myelopathy  I certify that, based on my findings, the following services are medically  necessary home health services:  Physical therapy  Reason for Medically Necessary Home Health Services:  Therapy- Therapeutic Exercises to Increase Strength and Endurance  My clinical findings support the need for the above services:  Unable to leave home safely without assistance and/or assistive device  Further, I certify that my clinical findings support that this patient is homebound due to:  Pain interferes with ambulation/mobility     For home use only DME 4 wheeled rolling walker with seat    Complete by:  As directed      Home Health    Complete by:  As directed   To provide the following care/treatments:   PT OT              Medication List    TAKE these medications        alendronate 70 MG tablet  Commonly known as:  FOSAMAX  Take 70 mg by mouth every 7 (seven) days. Take with a full glass of water on an empty stomach.  On Sunday     amLODipine 5 MG tablet  Commonly known as:  NORVASC  Take 5 mg by mouth at bedtime.     buPROPion 300 MG 24 hr tablet  Commonly known as:  WELLBUTRIN XL  Take 300 mg by mouth at bedtime.     calcium carbonate 600 MG Tabs tablet  Commonly known as:  OS-CAL  Take 600 mg by mouth 2 (two) times daily with a meal.     cyclobenzaprine 10 MG tablet  Commonly known as:  FLEXERIL  Take 1 tablet (10 mg total) by mouth 3 (three) times daily as needed for muscle spasms.     donepezil 10 MG tablet  Commonly known as:  ARICEPT  Take 10  mg by mouth 2 (two) times daily.     doxycycline 100 MG DR capsule  Commonly known as:  DORYX  Take 100 mg by mouth 2 (two) times daily. 15 day course started 05/04/15     HYDROcodone-acetaminophen 5-325 MG per tablet  Commonly known as:  NORCO/VICODIN  Take 1 tablet by mouth every 6 (six) hours as needed (pain).     leflunomide 20 MG tablet  Commonly known as:  ARAVA  Take 20 mg by mouth daily.     MUCINEX ALLERGY PO  Take by mouth every 12 (twelve) hours as needed.     multivitamin with minerals Tabs  tablet  Take 1 tablet by mouth daily.     oxyCODONE-acetaminophen 5-325 MG per tablet  Commonly known as:  PERCOCET/ROXICET  Take 1-2 tablets by mouth every 4 (four) hours as needed.     oxyCODONE-acetaminophen 5-325 MG per tablet  Commonly known as:  PERCOCET/ROXICET  Take 1-2 tablets by mouth every 4 (four) hours as needed for moderate pain.     pravastatin 40 MG tablet  Commonly known as:  PRAVACHOL  Take 40 mg by mouth at bedtime.     predniSONE 10 MG tablet  Commonly known as:  DELTASONE  Take 5-10 mg by mouth daily. Take 1 tablet (10 mg) on Monday, Wednesday, Friday, take 1/2 tablet (5 mg) on Sunday, Tuesday, Thursday, Saturday     propranolol 20 MG tablet  Commonly known as:  INDERAL  Take 20 mg by mouth 2 (two) times daily as needed (shaking of hands).     Pseudoephedrine-APAP-DM 30-325-15 MG/15ML Liqd  Take by mouth 1 day or 1 dose.     sertraline 100 MG tablet  Commonly known as:  ZOLOFT  Take 150 mg by mouth daily.     traZODone 50 MG tablet  Commonly known as:  DESYREL  Take 50-100 mg by mouth at bedtime as needed for sleep.         Signed: Vinny Taranto P 05/11/2015, 5:59 PM

## 2015-05-12 NOTE — Progress Notes (Addendum)
04/24/15 1400 Received a call back from patient stating that she is not currently interested in home health and does not need any equipment.    05/12/15 0945  Patient was discharged on 05/11/15 after hours.  Orders were received for HHPT/OT and a rolling walker. CM attempted to reach patient to discuss discharge needs and make arrangements.  Voicemail was left requesting a return call. CM will continue to follow.

## 2019-12-04 ENCOUNTER — Emergency Department (HOSPITAL_BASED_OUTPATIENT_CLINIC_OR_DEPARTMENT_OTHER): Payer: Medicare HMO

## 2019-12-04 ENCOUNTER — Encounter (HOSPITAL_BASED_OUTPATIENT_CLINIC_OR_DEPARTMENT_OTHER): Payer: Self-pay | Admitting: Emergency Medicine

## 2019-12-04 ENCOUNTER — Emergency Department (HOSPITAL_BASED_OUTPATIENT_CLINIC_OR_DEPARTMENT_OTHER)
Admission: EM | Admit: 2019-12-04 | Discharge: 2019-12-04 | Disposition: A | Discharge status: Home / Self Care | Payer: Medicare HMO | Attending: Emergency Medicine | Admitting: Emergency Medicine

## 2019-12-04 ENCOUNTER — Other Ambulatory Visit: Payer: Self-pay

## 2019-12-04 DIAGNOSIS — M069 Rheumatoid arthritis, unspecified: Secondary | ICD-10-CM | POA: Insufficient documentation

## 2019-12-04 DIAGNOSIS — I959 Hypotension, unspecified: Secondary | ICD-10-CM | POA: Diagnosis not present

## 2019-12-04 DIAGNOSIS — I10 Essential (primary) hypertension: Secondary | ICD-10-CM | POA: Insufficient documentation

## 2019-12-04 DIAGNOSIS — Y999 Unspecified external cause status: Secondary | ICD-10-CM | POA: Insufficient documentation

## 2019-12-04 DIAGNOSIS — Y92008 Other place in unspecified non-institutional (private) residence as the place of occurrence of the external cause: Secondary | ICD-10-CM | POA: Diagnosis not present

## 2019-12-04 DIAGNOSIS — S6992XA Unspecified injury of left wrist, hand and finger(s), initial encounter: Secondary | ICD-10-CM | POA: Diagnosis present

## 2019-12-04 DIAGNOSIS — F0390 Unspecified dementia without behavioral disturbance: Secondary | ICD-10-CM | POA: Diagnosis not present

## 2019-12-04 DIAGNOSIS — S52502A Unspecified fracture of the lower end of left radius, initial encounter for closed fracture: Secondary | ICD-10-CM | POA: Insufficient documentation

## 2019-12-04 DIAGNOSIS — W19XXXA Unspecified fall, initial encounter: Secondary | ICD-10-CM

## 2019-12-04 DIAGNOSIS — S72112A Displaced fracture of greater trochanter of left femur, initial encounter for closed fracture: Secondary | ICD-10-CM | POA: Insufficient documentation

## 2019-12-04 DIAGNOSIS — S62102A Fracture of unspecified carpal bone, left wrist, initial encounter for closed fracture: Secondary | ICD-10-CM

## 2019-12-04 DIAGNOSIS — Z888 Allergy status to other drugs, medicaments and biological substances status: Secondary | ICD-10-CM | POA: Insufficient documentation

## 2019-12-04 DIAGNOSIS — W010XXA Fall on same level from slipping, tripping and stumbling without subsequent striking against object, initial encounter: Secondary | ICD-10-CM | POA: Diagnosis not present

## 2019-12-04 DIAGNOSIS — Z79899 Other long term (current) drug therapy: Secondary | ICD-10-CM | POA: Diagnosis not present

## 2019-12-04 DIAGNOSIS — Y9301 Activity, walking, marching and hiking: Secondary | ICD-10-CM | POA: Insufficient documentation

## 2019-12-04 DIAGNOSIS — F17210 Nicotine dependence, cigarettes, uncomplicated: Secondary | ICD-10-CM | POA: Insufficient documentation

## 2019-12-04 DIAGNOSIS — S72002A Fracture of unspecified part of neck of left femur, initial encounter for closed fracture: Secondary | ICD-10-CM

## 2019-12-04 DIAGNOSIS — I951 Orthostatic hypotension: Secondary | ICD-10-CM

## 2019-12-04 LAB — CBC WITH DIFFERENTIAL/PLATELET
Abs Immature Granulocytes: 0.05 10*3/uL (ref 0.00–0.07)
Basophils Absolute: 0 10*3/uL (ref 0.0–0.1)
Basophils Relative: 0 %
Eosinophils Absolute: 0 10*3/uL (ref 0.0–0.5)
Eosinophils Relative: 0 %
HCT: 33.3 % — ABNORMAL LOW (ref 36.0–46.0)
Hemoglobin: 10.2 g/dL — ABNORMAL LOW (ref 12.0–15.0)
Immature Granulocytes: 1 %
Lymphocytes Relative: 5 %
Lymphs Abs: 0.5 10*3/uL — ABNORMAL LOW (ref 0.7–4.0)
MCH: 27.6 pg (ref 26.0–34.0)
MCHC: 30.6 g/dL (ref 30.0–36.0)
MCV: 90 fL (ref 80.0–100.0)
Monocytes Absolute: 0.9 10*3/uL (ref 0.1–1.0)
Monocytes Relative: 8 %
Neutro Abs: 9.4 10*3/uL — ABNORMAL HIGH (ref 1.7–7.7)
Neutrophils Relative %: 86 %
Platelets: 328 10*3/uL (ref 150–400)
RBC: 3.7 MIL/uL — ABNORMAL LOW (ref 3.87–5.11)
RDW: 15.8 % — ABNORMAL HIGH (ref 11.5–15.5)
WBC: 10.9 10*3/uL — ABNORMAL HIGH (ref 4.0–10.5)
nRBC: 0 % (ref 0.0–0.2)

## 2019-12-04 LAB — BASIC METABOLIC PANEL
Anion gap: 9 (ref 5–15)
BUN: 20 mg/dL (ref 8–23)
CO2: 25 mmol/L (ref 22–32)
Calcium: 8.9 mg/dL (ref 8.9–10.3)
Chloride: 107 mmol/L (ref 98–111)
Creatinine, Ser: 1.67 mg/dL — ABNORMAL HIGH (ref 0.44–1.00)
GFR calc Af Amer: 32 mL/min — ABNORMAL LOW (ref >60)
GFR calc non Af Amer: 27 mL/min — ABNORMAL LOW (ref >60)
Glucose, Bld: 125 mg/dL — ABNORMAL HIGH (ref 70–99)
Potassium: 4.2 mmol/L (ref 3.5–5.1)
Sodium: 141 mmol/L (ref 135–145)

## 2019-12-04 MED ORDER — SODIUM CHLORIDE 0.9 % IV BOLUS (SEPSIS)
1000.0000 mL | Freq: Once | INTRAVENOUS | Status: AC
  Administered 2019-12-04: 17:00:00 1000 mL via INTRAVENOUS

## 2019-12-04 MED ORDER — HYDROCODONE-ACETAMINOPHEN 5-325 MG PO TABS: 2.0000 | ORAL_TABLET | ORAL | 0 refills | Status: DC | PRN

## 2019-12-04 MED ORDER — MORPHINE SULFATE (PF) 2 MG/ML IV SOLN
2.0000 mg | Freq: Once | INTRAVENOUS | Status: AC
  Administered 2019-12-04: 2 mg via INTRAVENOUS
  Filled 2019-12-04: qty 1

## 2019-12-04 MED ORDER — ACETAMINOPHEN 500 MG PO TABS
1000.0000 mg | ORAL_TABLET | Freq: Once | ORAL | Status: AC
  Administered 2019-12-04: 16:00:00 1000 mg via ORAL
  Filled 2019-12-04: qty 2

## 2019-12-04 NOTE — Discharge Instructions (Addendum)
You are seen today for fall.  I think this fall is likely due to being dehydrated with not enough water in your system.  You were given some IV fluids.  You did have a fracture of your wrist as well as your left hip.  I have spoke with the hand specialist who would like to see you in the office on Wednesday.  His contact information is above and they will contact you about your appointment.  You also should follow-up with your regular orthopedic doctors in regards to your hip.  Is it okay to walk on the hip as needed but please always use assistance with a walker.  We have consulted with our transition of care team who will be in contact with you to discuss further assistance at home such as home health care or physical therapy.  Follow-up with your primary care doctor for discussions about possible placement in assisted living if needed.  You were prescribed pain medication which may make you sleepy or drowsy.  Only take this as needed for severe pain.  Thank you for allowing me to care for you today. Please return to the emergency department if you have new or worsening symptoms. Take your medications as instructed.

## 2019-12-04 NOTE — ED Provider Notes (Signed)
[facility] Provider Note   CSN: [id number] Arrival date & time: 12/04/19  1526     History Chief Complaint  Patient presents with  . Fall    Marcia Lynch is a 85 y.o. female.  Patient is an 85 year old female with past medical history of arthritis, dementia, hypertension, rheumatoid arthritis presenting to the emergency department for evaluation after unwitnessed fall.  Patient's daughter is at bedside who also lives with the patient.  Patient reports that she does not remember what happened.  Reports that she fell while walking to the kitchen this morning around 6 AM.  She was able to get up on her own but has had significant left wrist pain and left hip pain.  She does not remember the cause of the fall.  She denies any chest pain, shortness of breath, dizziness, lightheadedness.  Patient's daughter states that she does have dementia and is likely that she may have just forgotten reason for falling.  Reports that she tends to get up too quickly and this may have been the cause for her fall.  Patient is not sure if she hit her head but reports that she did not pass out.        Past Medical History:  Diagnosis Date  . Arthritis   . Cough    loose  started 05/05/15 no fever  . Dementia ([place])    STARTING SIGNS OF DEMENTIA  . Depression    TAKING ZOLOFT FOR A LONG TIME  . Headache(784.0)   . Hypertension   . Rheumatoid arthritis [facility])     Patient Active Problem List   Diagnosis Date Noted  . HNP (herniated nucleus pulposus), cervical 05/10/2015    Past Surgical History:  Procedure Laterality Date  . CARPAL TUNNEL RELEASE     RIGHT  . EYE SURGERY     BILATERAL CATARACTS  . FRACTURE SURGERY     RIGHT HIP-HAS SCREW  . POSTERIOR CERVICAL FUSION/FORAMINOTOMY N/A 09/20/2013   Procedure: CERVICAL THREE TO CERVICAL SEVEN  POSTERIOR CERVICAL FUSION;  Surgeon: Elaina Hoops, MD;  Location: [facility] NEURO ORS;  Service: Neurosurgery;  Laterality: N/A;   CERVICAL THREE TO CERVICAL SEVEN  POSTERIOR CERVICAL FUSION  . POSTERIOR CERVICAL FUSION/FORAMINOTOMY N/A 05/10/2015   Procedure: Posterior Cervical Laminectomy Diskectomy and Fusion Cervical seven Thoracic one. Exploration of Fusion Removal of Hardware Cervical three to seven;  Surgeon: Kary Kos, MD;  Location: [facility] NEURO ORS;  Service: Neurosurgery;  Laterality: N/A;  . ROTATOR CUFF REPAIR     RIGHT SHOULDER  . SHOULDER ARTHROSCOPY     ? LEFT BONE SPURSS     OB History   No obstetric history on file.     History reviewed. No pertinent family history.  Social History   Tobacco Use  . Smoking status: Current Every Day Smoker    Packs/day: 0.25    Years: 68.00    Pack years: 17.00    Types: Cigarettes  Substance Use Topics  . Alcohol use: No  . Drug use: No    Home Medications Prior to Admission medications   Medication Sig Start Date End Date Taking? Authorizing Provider  alendronate (FOSAMAX) 70 MG tablet Take 70 mg by mouth every 7 (seven) days. Take with a full glass of water on an empty stomach.  On Sunday    [provider]  amLODipine (NORVASC) 5 MG tablet Take 5 mg by mouth at bedtime.  04/24/15   [provider]  buPROPion [facility]  XL) 300 MG 24 hr tablet Take 300 mg by mouth at bedtime.  02/13/15   [provider]  calcium carbonate (OS-CAL) 600 MG TABS tablet Take 600 mg by mouth 2 (two) times daily with a meal.    [provider]  cyclobenzaprine (FLEXERIL) 10 MG tablet Take 1 tablet (10 mg total) by mouth 3 (three) times daily as needed for muscle spasms. 09/22/13   Kary Kos, MD  donepezil (ARICEPT) 10 MG tablet Take 10 mg by mouth 2 (two) times daily.     [provider]  doxycycline (DORYX) 100 MG DR capsule Take 100 mg by mouth 2 (two) times daily. 15 day course started 05/04/15    [provider]  Fexofenadine HCl (MUCINEX ALLERGY PO) Take by mouth every 12 (twelve) hours as needed.    [provider]    HYDROcodone-acetaminophen (NORCO/VICODIN) 5-325 MG tablet Take 2 tablets by mouth every 4 (four) hours as needed. 12/04/19   Alveria Apley, PA-C  leflunomide (ARAVA) 20 MG tablet Take 20 mg by mouth daily. 04/27/15   [provider]  Multiple Vitamin (MULTIVITAMIN WITH MINERALS) TABS tablet Take 1 tablet by mouth daily.    [provider]  oxyCODONE-acetaminophen (PERCOCET/ROXICET) 5-325 MG per tablet Take 1-2 tablets by mouth every 4 (four) hours as needed. Patient not taking: Reported on 05/05/2015 09/22/13   Kary Kos, MD  oxyCODONE-acetaminophen (PERCOCET/ROXICET) 5-325 MG per tablet Take 1-2 tablets by mouth every 4 (four) hours as needed for moderate pain. 05/11/15   Kary Kos, MD  pravastatin (PRAVACHOL) 40 MG tablet Take 40 mg by mouth at bedtime.  03/22/15   [provider]  predniSONE (DELTASONE) 10 MG tablet Take 5-10 mg by mouth daily. Take 1 tablet (10 mg) on Monday, Wednesday, Friday, take 1/2 tablet (5 mg) on Sunday, Tuesday, Thursday, Saturday 02/02/15   [provider]  propranolol (INDERAL) 20 MG tablet Take 20 mg by mouth 2 (two) times daily as needed (shaking of hands).  04/15/15   [provider]  Pseudoephedrine-APAP-DM 35-701-77 MG/15ML LIQD Take by mouth 1 day or 1 dose.    [provider]  sertraline (ZOLOFT) 100 MG tablet Take 150 mg by mouth daily.     [provider]  traZODone (DESYREL) 50 MG tablet Take 50-100 mg by mouth at bedtime as needed for sleep.  04/17/15   [provider]    Allergies    Cymbalta [duloxetine hcl]  Review of Systems   Review of Systems  Constitutional: Negative for appetite change and fever.  HENT: Negative for sore throat.   Eyes: Negative for visual disturbance.  Respiratory: Negative for cough and shortness of breath.   Cardiovascular: Negative for chest pain, palpitations and leg swelling.  Gastrointestinal: Negative for abdominal pain, nausea and vomiting.   Genitourinary: Negative for dysuria.  Musculoskeletal: Positive for arthralgias, gait problem and joint swelling. Negative for back pain, myalgias, neck pain and neck stiffness.  Skin: Positive for color change. Negative for rash and wound.  Neurological: Negative for dizziness, tremors, seizures, syncope, facial asymmetry, speech difficulty, weakness, light-headedness, numbness and headaches.  Hematological: Does not bruise/bleed easily.    Physical Exam Updated Vital Signs BP (!) 157/72   Pulse 96   Temp 98.2 F (36.8 C)   Resp 20   Ht 5' 4.5" (1.638 m)   Wt 44.5 kg   SpO2 94%   BMI 16.56 kg/m   Physical Exam Vitals and nursing note reviewed.  Constitutional:  General: She is not in acute distress.    Appearance: Normal appearance. She is not ill-appearing, toxic-appearing or diaphoretic.  HENT:     Head: Normocephalic and atraumatic.     Jaw: There is normal jaw occlusion.     Mouth/Throat:     Mouth: Mucous membranes are moist.  Eyes:     Conjunctiva/sclera: Conjunctivae normal.  Neck:     Comments: Well-healed midline lower cervical scar.  Restricted range of motion but no bony tenderness or pain with movements. Cardiovascular:     Rate and Rhythm: Normal rate.     Pulses: Normal pulses.  Pulmonary:     Effort: Pulmonary effort is normal.  Musculoskeletal:     Right elbow: Normal.     Left elbow: Normal.     Left upper leg: Tenderness present. No swelling, edema, deformity or lacerations.     Right knee: Normal.     Left knee: Normal.     Right lower leg: Normal.     Left lower leg: Normal.     Right ankle: Normal.     Left ankle: Normal.       Legs:     Comments: There is significant pain and swelling over the right wrist and mid forearm.  Normal strength, sensation, capillary refill and distal pulses.  Lateral left hip tenderness to palpation without any obvious signs of deformity, swelling, ecchymosis or injury or trauma.  Normal distal strength,  sensation, capillary refill and pulses.  She does have increased lateral hip pain with external rotation and abduction  Skin:    General: Skin is dry.  Neurological:     Mental Status: She is alert.  Psychiatric:        Mood and Affect: Mood normal.     ED Results / Procedures / Treatments   Labs (all labs ordered are listed, but only abnormal results are displayed) Labs Reviewed  BASIC METABOLIC PANEL - Abnormal; Notable for the following components:      Result Value   Glucose, Bld 125 (*)    Creatinine, Ser 1.67 (*)    GFR calc non Af Amer 27 (*)    GFR calc Af Amer 32 (*)    All other components within normal limits  CBC WITH DIFFERENTIAL/PLATELET - Abnormal; Notable for the following components:   WBC 10.9 (*)    RBC 3.70 (*)    Hemoglobin 10.2 (*)    HCT 33.3 (*)    RDW 15.8 (*)    Neutro Abs 9.4 (*)    Lymphs Abs 0.5 (*)    All other components within normal limits    EKG EKG Interpretation  Date/Time:  Saturday December 04 2019 16:13:52 EST Ventricular Rate:  93 PR Interval:    QRS Duration: 78 QT Interval:  352 QTC Calculation: 438 R Axis:   41 Text Interpretation: Sinus rhythm Probable left atrial enlargement since last tracing no significant change Confirmed by Noemi Chapel [phone]) on 12/04/2019 4:25:35 PM   Radiology DG Forearm Left  Result Date: 12/04/2019 CLINICAL DATA:  Left forearm pain and deformity and bruising secondary to a fall at home today. EXAM: LEFT FOREARM - 2 VIEW COMPARISON:  None. FINDINGS: There is an impacted fracture of the metaphysis of the distal left radius. Tiny avulsion of the ulnar styloid. Chronic arthritic changes in the wrist. Proximal radius and ulna appear normal. No elbow joint effusion. No dislocations. IMPRESSION: 1. Impacted fracture of the metaphysis of the distal left radius. 2. Tiny avulsion  of the ulnar styloid. Electronically Signed   By: Lorriane Shire M.D.   On: 12/04/2019 18:09   CT Head Wo Contrast  Result  Date: 12/04/2019 CLINICAL DATA:  Head trauma secondary to a fall this morning. Headache. Left arm pain and left leg pain and left wrist swelling. EXAM: CT HEAD WITHOUT CONTRAST CT CERVICAL SPINE WITHOUT CONTRAST TECHNIQUE: Multidetector CT imaging of the head and cervical spine was performed following the standard protocol without intravenous contrast. Multiplanar CT image reconstructions of the cervical spine were also generated. COMPARISON:  CT scan of the head dated 11/28/2015 and CT scan of the cervical spine dated 12/10/2015 FINDINGS: CT HEAD FINDINGS Brain: No evidence of acute infarction, hemorrhage, hydrocephalus, extra-axial collection or mass lesion/mass effect. Diffuse atrophy, most severe in the temporal lobes. Old tiny lacunar infarcts in the periventricular white matter and basal ganglia. Vascular: No hyperdense vessel or unexpected calcification. Skull: Normal. Negative for fracture or focal lesion. Sinuses/Orbits: Normal. Other: None CT CERVICAL SPINE FINDINGS Alignment: Chronic straightening and slight reversal of the cervical lordosis. Chronic 2 mm anterolisthesis of C3 on C4. Skull base and vertebrae: Patient has had posterior decompression from C3-4 through C7-T1. There is solid posterior fusion from C3 through T1. There is chronic hypertrophy of the transverse ligament at C1-2, increased since the prior study but without significant compromise of the spinal canal. Soft tissues and spinal canal: The paraspinal soft tissues are normal. No visible canal hematoma. Disc levels: C2-3: No disc bulging or protrusion. Moderate right facet arthritis. C3-4 through C7-T1: Solid posterior fusion at each level. Posterior rods and screws in place with no evidence of loosening. No acute abnormalities. No change since the prior study. T1-2: No disc bulging or protrusion. Moderate right facet arthritis, chronic. Upper chest: Negative. Other: None IMPRESSION: 1. No acute intracranial abnormality. Diffuse atrophy,  most severe in the temporal lobes. 2. No acute abnormality of the cervical spine. Chronic hypertrophy of the transverse ligament at C1-2, increased since the prior study but without significant compromise of the spinal canal. 3. Solid posterior fusion from C3 through T1. Electronically Signed   By: Lorriane Shire M.D.   On: 12/04/2019 18:01   CT Cervical Spine Wo Contrast  Result Date: 12/04/2019 CLINICAL DATA:  Head trauma secondary to a fall this morning. Headache. Left arm pain and left leg pain and left wrist swelling. EXAM: CT HEAD WITHOUT CONTRAST CT CERVICAL SPINE WITHOUT CONTRAST TECHNIQUE: Multidetector CT imaging of the head and cervical spine was performed following the standard protocol without intravenous contrast. Multiplanar CT image reconstructions of the cervical spine were also generated. COMPARISON:  CT scan of the head dated 11/28/2015 and CT scan of the cervical spine dated 12/10/2015 FINDINGS: CT HEAD FINDINGS Brain: No evidence of acute infarction, hemorrhage, hydrocephalus, extra-axial collection or mass lesion/mass effect. Diffuse atrophy, most severe in the temporal lobes. Old tiny lacunar infarcts in the periventricular white matter and basal ganglia. Vascular: No hyperdense vessel or unexpected calcification. Skull: Normal. Negative for fracture or focal lesion. Sinuses/Orbits: Normal. Other: None CT CERVICAL SPINE FINDINGS Alignment: Chronic straightening and slight reversal of the cervical lordosis. Chronic 2 mm anterolisthesis of C3 on C4. Skull base and vertebrae: Patient has had posterior decompression from C3-4 through C7-T1. There is solid posterior fusion from C3 through T1. There is chronic hypertrophy of the transverse ligament at C1-2, increased since the prior study but without significant compromise of the spinal canal. Soft tissues and spinal canal: The paraspinal soft tissues are normal. No  visible canal hematoma. Disc levels: C2-3: No disc bulging or protrusion.  Moderate right facet arthritis. C3-4 through C7-T1: Solid posterior fusion at each level. Posterior rods and screws in place with no evidence of loosening. No acute abnormalities. No change since the prior study. T1-2: No disc bulging or protrusion. Moderate right facet arthritis, chronic. Upper chest: Negative. Other: None IMPRESSION: 1. No acute intracranial abnormality. Diffuse atrophy, most severe in the temporal lobes. 2. No acute abnormality of the cervical spine. Chronic hypertrophy of the transverse ligament at C1-2, increased since the prior study but without significant compromise of the spinal canal. 3. Solid posterior fusion from C3 through T1. Electronically Signed   By: Lorriane Shire M.D.   On: 12/04/2019 18:01   CT HIP LEFT WO CONTRAST  Result Date: 12/04/2019 CLINICAL DATA:  Fall, left hip pain EXAM: CT OF THE LEFT HIP WITHOUT CONTRAST TECHNIQUE: Multidetector CT imaging of the left hip was performed according to the standard protocol. Multiplanar CT image reconstructions were also generated. COMPARISON:  Same-day x-ray FINDINGS: Bones/Joint/Cartilage There is an acute fracture involving the tip of the left greater trochanter which is displaced proximally by 1.4 cm. Fracture is mildly comminuted. Gluteus medius tendon appears intact inserting on the avulsed fragment (series 5, images 19-22). The anterior aspect of the greater trochanter at the gluteus minimus insertion site is fractured without significant displacement. Fracture does not extend to the lesser trochanter, femoral neck, or femoral head. Left hip joint is intact without dislocation. There is a chronic appearing fracture deformity of the parasymphyseal aspect of the left pubic bone with irregular fragmentation. Fragmentations largely demonstrate corticated margins. There is overlying soft tissue thickening without hematoma. Ligaments Suboptimally assessed by CT. Muscles and Tendons Left gluteal tendon findings, as above. Remaining  muscular and tendinous structures about the left hip are intact. Soft tissues No soft tissue hematoma. No acute findings within the visualized deep pelvis. IMPRESSION: 1. Acute fracture involving the tip of the left greater trochanter which is displaced proximally by 1.4 cm. 2. Chronic appearing fracture deformity of the parasymphyseal aspect of the left pubic bone with irregular fragmentation. Electronically Signed   By: Davina Poke M.D.   On: 12/04/2019 19:17   DG Hand Complete Left  Result Date: 12/04/2019 CLINICAL DATA:  Left wrist bruising and deformity secondary to a fall today. EXAM: LEFT HAND - COMPLETE 3+ VIEW COMPARISON:  Hand radiographs dated 01/24/2016 FINDINGS: There is an impacted slightly dorsally displaced and dorsally angulated fracture of the metaphysis of the distal left radius. Tiny avulsion of the ulnar styloid. Chronic severe degenerative changes in the wrist, stable. Diffuse arthritic changes in the IP joints. Chronic Radiodense foreign body in the soft tissues of the volar aspect of the base of the left index finger. IMPRESSION: 1. Acute fracture of the metaphysis of the distal left radius. 2. Tiny avulsion of the ulnar styloid. 3. Radiodense foreign body in the soft tissues of the base of the left index finger. 4. Diffuse arthritic changes, stable. Electronically Signed   By: Lorriane Shire M.D.   On: 12/04/2019 18:08   DG Hip Unilat W or Wo Pelvis 2-3 Views Left  Result Date: 12/04/2019 CLINICAL DATA:  Left hip pain secondary to a fall today. EXAM: DG HIP (WITH OR WITHOUT PELVIS) 2-3V LEFT COMPARISON:  MRI dated 09/19/2016 FINDINGS: There is an avulsed distracted fracture of the tip of the left greater trochanter of the proximal left femur. There is no intertrochanteric extent of the fracture in the  left femoral neck is intact. There is a deformity of the left pubic body which is new since the prior MRI. Is the patient tender over the symphysis pubis? This may be an old  injury. Note is made of hardware in the right hip from previous open reduction internal fixation of proximal right femur fracture. IMPRESSION: 1. Acute avulsed fracture of the tip of the greater trochanter of the proximal left femur. 2. Deformity of the left pubic body which is new since the prior MRI. This may be an old injury. Electronically Signed   By: Lorriane Shire M.D.   On: 12/04/2019 18:05    Procedures .Splint Application  Date/Time: 12/04/2019 7:40 PM Performed by: Alveria Apley, PA-C Authorized by: Alveria Apley, PA-C   Consent:    Consent obtained:  Verbal   Consent given by:  Patient   Risks discussed:  Discoloration, numbness, swelling and pain   Alternatives discussed:  Delayed treatment, observation and referral Pre-procedure details:    Sensation:  Normal Procedure details:    Laterality:  Left   Location:  Wrist   Wrist:  L wrist   Cast type:  Short arm   Splint type: reverse sugar tong.   Supplies:  Ortho-Glass Post-procedure details:    Pain:  Unchanged   Sensation:  Normal   Patient tolerance of procedure:  Tolerated well, no immediate complications   (including critical care time)  Medications Ordered in ED Medications  acetaminophen (TYLENOL) tablet 1,000 mg (1,000 mg Oral Given 12/04/19 1618)  sodium chloride 0.9 % bolus 1,000 mL (1,000 mLs Intravenous New Bag/Given 12/04/19 1646)  morphine 2 MG/ML injection 2 mg (2 mg Intravenous Given 12/04/19 1815)    ED Course  I have reviewed the triage vital signs and the nursing notes.  Pertinent labs & imaging results that were available during my care of the patient were reviewed by me and considered in my medical decision making (see chart for details).  Clinical Course as of Dec 03 1940  Sat Dec 04, 2019  1559 Elderly patient presenting for evaluation of wrist and hip pain after fall at 6 AM.  She does have dementia and does not recall the cause of the fall.  For this reason I am going to obtain  labs, EKG and imaging of her head and neck as well as her left arm and hip.  Patient's family member at bedside who lives with her states that it is not uncommon for her to be forgetful.  Patient is currently only complaining of joint pain and not any other concerning symptoms including chest pain, shortness of breath, dizziness, lightheadedness, syncope.   [KM]  0962 EZMOQ showing 1. Impacted fracture of the metaphysis of the distal left radius. 2. Tiny avulsion of the ulnar styloid. 1. Acute avulsed fracture of the tip of the greater trochanter of the proximal left femur. 2. Deformity of the left pubic body which is new since the prior MRI. This may be an old injury.     [KM]  1856 I spoke with Dr. Claudia Desanctis with hand surgery. He reported that she should be placed in sugar tong splint and f/u on Wednesday. I will send this patient's chart to his inbox. Currently awaiting hip Ct for further eval of the hip fx   [KM]  1921 CT confirming fracture of the tip of the greater trochanter of the left hip.  CT also noting that the pubic fracture appears to be chronic.  This is consistent with  the patient and her daughter story of previous injury to the tailbone.  Discussed with Dr. Sabra Heck.  Patient and daughter feel comfortable with discharge but would like consult for further needs such as at home health care or physical therapy or future placement.  Discussed treatment with pain medications and the daughter states that the patient does well with hydrocodone.  Discussed risks and benefits of such.  Myself and Dr. Sabra Heck placed a splint and had discussion with patient and daughter about findings and treatment.   [KM]    Clinical Course User Index [KM] Kristine Royal   MDM Rules/Calculators/A&P     CHA2DS2/VAS Stroke Risk Points      N/A >= 2 Points: High Risk  1 - 1.99 Points: Medium Risk  0 Points: Low Risk    A final score could not be computed because of missing components.: Last  Change: N/A       This score determines the patient's risk of having a stroke if the  patient has atrial fibrillation.      This score is not applicable to this patient. Components are not  calculated.                   Based on review of vitals, medical screening exam, lab work and/or imaging, there does not appear to be an acute, emergent etiology for the patient's symptoms. Counseled pt on good return precautions and encouraged both PCP and ED follow-up as needed.  Prior to discharge, I also discussed incidental imaging findings with patient in detail and advised appropriate, recommended follow-up in detail.  Clinical Impression: 1. Fall, initial encounter   2. Orthostatic hypotension   3. Closed fracture of left hip, initial encounter ([place])   4. Closed fracture of left wrist, initial encounter     Disposition: Discharge  Prior to providing a prescription for a controlled substance, I independently reviewed the patient's recent prescription history on the [facility]. The patient had no recent or regular prescriptions and was deemed appropriate for a brief, less than 3 day prescription of narcotic for acute analgesia.  This note was prepared with assistance of Systems analyst. Occasional wrong-word or sound-a-like substitutions may have occurred due to the inherent limitations of voice recognition software.  Final Clinical Impression(s) / ED Diagnoses Final diagnoses:  Fall, initial encounter  Orthostatic hypotension  Closed fracture of left hip, initial encounter ([place])  Closed fracture of left wrist, initial encounter    Rx / DC Orders ED Discharge Orders         Ordered    HYDROcodone-acetaminophen (NORCO/VICODIN) 5-325 MG tablet  Every 4 hours PRN     12/04/19 1938           Kristine Royal 12/04/19 1941    Noemi Chapel, MD 12/04/19 1945

## 2019-12-04 NOTE — ED Provider Notes (Signed)
Fell at 6 AM, unwitnessed Walking with a limp on the left side and left wrist pain and swelling.  The daughter states that she only walked from the house out to the car to help come to the hospital but somehow had pulled herself into the chair this morning after the fall.  There is a deformity with bruising of the left wrist decreased range of motion secondary to pain, she does seem to be able to extend and flex all fingers against resistance.  She has normal pulses at the wrist.  She has decreased range of motion of the left hip secondary to pain, she has significant pain with flexion and any internal or external rotation though I do not see any leg length discrepancies.  Normal pulses at the feet.  Harsh systolic murmur auscultated on exam   Otherwise in no distress No thinners, VS with hypertension  EKG normal  Imaging pending.  Anticipate possible hip and wrist fractures  Discussed with the family that we would be helping with home health orders to have somebody follow-up with them in the home setting, the patient appears in no distress however the family member acknowledges that there may be some difficulty with safety and ambulation.  Will be provided with home health services, the family member is agreeable, will have her family doctor look for outpatient placement   EKG Interpretation  Date/Time:  Saturday December 04 2019 16:13:52 EST Ventricular Rate:  93 PR Interval:    QRS Duration: 78 QT Interval:  352 QTC Calculation: 438 R Axis:   41 Text Interpretation: Sinus rhythm Probable left atrial enlargement since last tracing no significant change Confirmed by Noemi Chapel [phone]) on 12/04/2019 4:25:35 PM        Medical screening examination/treatment/procedure(s) were conducted as a shared visit with non-physician practitioner(s) and myself.  I personally evaluated the patient during the encounter.  Clinical Impression:   Final diagnoses:  Fall, initial encounter   Orthostatic hypotension  Closed fracture of left hip, initial encounter [facility])  Closed fracture of left wrist, initial encounter         Noemi Chapel, MD 12/04/19 1945

## 2019-12-04 NOTE — ED Notes (Signed)
Due to drop in blood pressure, patient did NOT stand for vitals. PA made aware of patients orthostatic vital signs.

## 2019-12-04 NOTE — ED Triage Notes (Signed)
Pt reports she fell this morning ~6am. Pt denies tripping. States "I don't know what happened. I just got up off the floor". C/o pain in left arm and left leg. Left wrist +swelling

## 2019-12-05 ENCOUNTER — Telehealth: Payer: Self-pay | Admitting: *Deleted

## 2019-12-05 NOTE — Telephone Encounter (Signed)
ED CM at [facility] called the patient. The patient requested that CM speak with her daughter, Jackelyn Poling. Jackelyn Poling states she works part-time and asked if there is someone that would be able to come to sit with the patient from a home health agency. CM explained that personal care assistants are not covered by insurance unless she has a long--term care policy. Explained that she can hire someone through an agency but would need to pay out-of-pocket. She states the EDP told her they may need to discuss home care with her PCP. CM explained that if she would she needs nursing visits for medication management her PCP can orders services as needed. She states she understands.

## 2019-12-09 ENCOUNTER — Institutional Professional Consult (permissible substitution): Payer: Medicare HMO | Admitting: Plastic Surgery

## 2019-12-09 ENCOUNTER — Other Ambulatory Visit (HOSPITAL_COMMUNITY): Payer: Medicare HMO

## 2019-12-11 LAB — HEPATIC FUNCTION PANEL
ALT: 17 (ref 7–35)
AST: 33 (ref 13–35)
Alkaline Phosphatase: 111 (ref 25–125)
Bilirubin, Total: 0.3

## 2019-12-11 LAB — COMPREHENSIVE METABOLIC PANEL: Albumin: 2.7 — AB (ref 3.5–5.0)

## 2019-12-13 ENCOUNTER — Encounter (HOSPITAL_BASED_OUTPATIENT_CLINIC_OR_DEPARTMENT_OTHER): Admission: RE | Payer: Self-pay | Source: Home / Self Care

## 2019-12-13 ENCOUNTER — Ambulatory Visit (HOSPITAL_BASED_OUTPATIENT_CLINIC_OR_DEPARTMENT_OTHER): Admission: RE | Admit: 2019-12-13 | Payer: Medicare HMO | Source: Home / Self Care | Admitting: Plastic Surgery

## 2019-12-13 SURGERY — OPEN REDUCTION INTERNAL FIXATION (ORIF) RADIAL FRACTURE
Anesthesia: Regional | Site: Wrist | Laterality: Left

## 2019-12-14 ENCOUNTER — Non-Acute Institutional Stay (SKILLED_NURSING_FACILITY): Payer: Medicare HMO | Admitting: Internal Medicine

## 2019-12-14 DIAGNOSIS — F329 Major depressive disorder, single episode, unspecified: Secondary | ICD-10-CM

## 2019-12-14 DIAGNOSIS — E785 Hyperlipidemia, unspecified: Secondary | ICD-10-CM

## 2019-12-14 DIAGNOSIS — R918 Other nonspecific abnormal finding of lung field: Secondary | ICD-10-CM

## 2019-12-14 DIAGNOSIS — S72002S Fracture of unspecified part of neck of left femur, sequela: Secondary | ICD-10-CM | POA: Diagnosis not present

## 2019-12-14 DIAGNOSIS — F0280 Dementia in other diseases classified elsewhere without behavioral disturbance: Secondary | ICD-10-CM

## 2019-12-14 DIAGNOSIS — S52502S Unspecified fracture of the lower end of left radius, sequela: Secondary | ICD-10-CM

## 2019-12-14 DIAGNOSIS — J9601 Acute respiratory failure with hypoxia: Secondary | ICD-10-CM

## 2019-12-14 DIAGNOSIS — Z96642 Presence of left artificial hip joint: Secondary | ICD-10-CM

## 2019-12-14 DIAGNOSIS — B20 Human immunodeficiency virus [HIV] disease: Secondary | ICD-10-CM

## 2019-12-14 DIAGNOSIS — Z9889 Other specified postprocedural states: Secondary | ICD-10-CM

## 2019-12-14 DIAGNOSIS — F32A Depression, unspecified: Secondary | ICD-10-CM

## 2019-12-14 DIAGNOSIS — M0579 Rheumatoid arthritis with rheumatoid factor of multiple sites without organ or systems involvement: Secondary | ICD-10-CM

## 2019-12-14 DIAGNOSIS — Z8781 Personal history of (healed) traumatic fracture: Secondary | ICD-10-CM

## 2019-12-14 MED ORDER — ACETAMINOPHEN ER 650 MG PO TBCR: 650.0000 mg | EXTENDED_RELEASE_TABLET | Freq: Three times a day (TID) | ORAL | 0 refills | Status: DC

## 2019-12-14 MED ORDER — HYDROCODONE-ACETAMINOPHEN 5-325 MG PO TABS: 1.0000 | ORAL_TABLET | Freq: Two times a day (BID) | ORAL | 0 refills | Status: DC

## 2019-12-14 NOTE — Progress Notes (Signed)
: Provider:  Hennie Duos MD Location:   [facility]   Place of Service:   SNF  PCP: Hennie Duos, MD Patient Care Team: Hennie Duos, MD as PCP - General (Internal Medicine)  Extended Emergency Contact Information Primary Emergency Contact: Karle Plumber Address: [address] [place], [place] [address] [place] Phone: [phone] Relation: Friend     Allergies: Cymbalta [duloxetine hcl]  Chief Complaint  Patient presents with  . New Admit To SNF    HPI: Patient is 85 y.o. female with dementia, hypertension, hyperlipidemia, COPD, depression, rheumatoid arthritis, anemia, CKD stage III, GERD, who presented to outside hospital after a fall on 12/12 where she sustained a left radial fracture.  She was to follow-up-up with outpatient Ortho but she had a fall again on 12/15 and was brought to the ER with left hip pain and hip x-ray revealed a proximal femur fracture.  She was admitted to [facility] from 12/15-22 where she underwent an open reduction internal fixation of left hip fracture on 12/16.  CT of the chest without contrast obtained on 12/18 showed new pulmonary nodules concerning for malignancy.  A detailed discussion was held with both the patient and her friend/medical POA and they decided to hold on any further testing until she was discharged from SNF.  Patient is admitted to skilled nursing facility for OT/PT.  While at skilled nursing facility patient will be followed for rheumatoid arthritis treated with steroids, and Arava, dementia treated with Aricept and Namenda and hyperlipidemia treated with Lipitor.  Past Medical History:  Diagnosis Date  . Arthritis   . Cough    loose  started 05/05/15 no fever  . Dementia ([place])    STARTING SIGNS OF DEMENTIA  . Depression    TAKING ZOLOFT FOR A LONG TIME  . Headache(784.0)   . Hypertension   . Rheumatoid arthritis [facility])     Past Surgical History:  Procedure  Laterality Date  . CARPAL TUNNEL RELEASE     RIGHT  . EYE SURGERY     BILATERAL CATARACTS  . FRACTURE SURGERY     RIGHT HIP-HAS SCREW  . POSTERIOR CERVICAL FUSION/FORAMINOTOMY N/A 09/20/2013   Procedure: CERVICAL THREE TO CERVICAL SEVEN  POSTERIOR CERVICAL FUSION;  Surgeon: Elaina Hoops, MD;  Location: [place];  Service: Neurosurgery;  Laterality: N/A;  CERVICAL THREE TO CERVICAL SEVEN  POSTERIOR CERVICAL FUSION  . POSTERIOR CERVICAL FUSION/FORAMINOTOMY N/A 05/10/2015   Procedure: Posterior Cervical Laminectomy Diskectomy and Fusion Cervical seven Thoracic one. Exploration of Fusion Removal of Hardware Cervical three to seven;  Surgeon: Kary Kos, MD;  Location: [place] [facility];  Service: Neurosurgery;  Laterality: N/A;  . ROTATOR CUFF REPAIR     RIGHT SHOULDER  . SHOULDER ARTHROSCOPY     ? LEFT BONE SPURSS    Allergies as of 12/14/2019      Reactions   Cymbalta [duloxetine Hcl] Rash      Medication List       Accurate as of December 14, 2019 11:59 PM. If you have any questions, ask your nurse or doctor.        acetaminophen 650 MG CR tablet Commonly known as: Tylenol 8 Hour Take 1 tablet (650 mg total) by mouth 3 (three) times daily.   alendronate 70 MG tablet Commonly known as: FOSAMAX Take 70 mg by mouth every 7 (seven) days. Take with a full glass of  water on an empty stomach.  On Sunday   amLODipine 5 MG tablet Commonly known as: NORVASC Take 5 mg by mouth at bedtime.   buPROPion 300 MG 24 hr tablet Commonly known as: WELLBUTRIN XL Take 300 mg by mouth at bedtime.   calcium carbonate 600 MG Tabs tablet Commonly known as: OS-CAL Take 600 mg by mouth 2 (two) times daily with a meal.   cyclobenzaprine 10 MG tablet Commonly known as: FLEXERIL Take 1 tablet (10 mg total) by mouth 3 (three) times daily as needed for muscle spasms.   donepezil 10 MG tablet Commonly known as: ARICEPT Take 10 mg by mouth 2 (two) times daily.   doxycycline 100 MG DR  capsule Commonly known as: DORYX Take 100 mg by mouth 2 (two) times daily. 15 day course started 05/04/15   HYDROcodone-acetaminophen 5-325 MG tablet Commonly known as: NORCO/VICODIN Take 1 tablet by mouth 2 (two) times daily. What changed:   how much to take  when to take this  reasons to take this   leflunomide 20 MG tablet Commonly known as: ARAVA Take 20 mg by mouth daily.   MUCINEX ALLERGY PO Take by mouth every 12 (twelve) hours as needed.   multivitamin with minerals Tabs tablet Take 1 tablet by mouth daily.   oxyCODONE-acetaminophen 5-325 MG tablet Commonly known as: PERCOCET/ROXICET Take 1-2 tablets by mouth every 4 (four) hours as needed.   oxyCODONE-acetaminophen 5-325 MG tablet Commonly known as: PERCOCET/ROXICET Take 1-2 tablets by mouth every 4 (four) hours as needed for moderate pain.   pravastatin 40 MG tablet Commonly known as: PRAVACHOL Take 40 mg by mouth at bedtime.   predniSONE 10 MG tablet Commonly known as: DELTASONE Take 5-10 mg by mouth daily. Take 1 tablet (10 mg) on Monday, Wednesday, Friday, take 1/2 tablet (5 mg) on Sunday, Tuesday, Thursday, Saturday   propranolol 20 MG tablet Commonly known as: INDERAL Take 20 mg by mouth 2 (two) times daily as needed (shaking of hands).   Pseudoephedrine-APAP-DM 30-325-15 MG/15ML Liqd Take by mouth 1 day or 1 dose.   sertraline 100 MG tablet Commonly known as: ZOLOFT Take 150 mg by mouth daily.   traZODone 50 MG tablet Commonly known as: DESYREL Take 50-100 mg by mouth at bedtime as needed for sleep.       Meds ordered this encounter  Medications  . HYDROcodone-acetaminophen (NORCO/VICODIN) 5-325 MG tablet    Sig: Take 1 tablet by mouth 2 (two) times daily.    Dispense:  20 tablet    Refill:  0  . acetaminophen (TYLENOL 8 HOUR) 650 MG CR tablet    Sig: Take 1 tablet (650 mg total) by mouth 3 (three) times daily.    Dispense:  60 tablet    Refill:  0    Immunization History   Administered Date(s) Administered  . Influenza,inj,Quad PF,6+ Mos 09/21/2013  . Pneumococcal Polysaccharide-23 09/22/2013    Social History   Tobacco Use  . Smoking status: Current Every Day Smoker    Packs/day: 0.25    Years: 68.00    Pack years: 17.00    Types: Cigarettes  . Smokeless tobacco: Never Used  Substance Use Topics  . Alcohol use: No    Family history is is positive for glaucoma hypertension and diabetes in sister, hypertension in brother  No family history on file.    Review of Systems  GENERAL:  no fevers, fatigue, appetite changes SKIN: No itching, or rash EYES: No eye pain, redness, discharge  EARS: No earache, tinnitus, change in hearing NOSE: No congestion, drainage or bleeding  MOUTH/THROAT: No mouth or tooth pain, No sore throat RESPIRATORY: No cough, wheezing, SOB CARDIAC: No chest pain, palpitations, lower extremity edema  GI: No abdominal pain, No N/V/D or constipation, No heartburn or reflux  GU: No dysuria, frequency or urgency, or incontinence  MUSCULOSKELETAL: No unrelieved bone/joint pain NEUROLOGIC: No headache, dizziness or focal weakness PSYCHIATRIC: No c/o anxiety or sadness   Vitals:   12/19/19 1136  BP: 125/67  Pulse: 78  Resp: 18  Temp: (!) 97 F (36.1 C)  SpO2: 90%    SpO2 Readings from Last 1 Encounters:  12/19/19 90%   Body mass index is 16.82 kg/m.     Physical Exam  GENERAL APPEARANCE: Alert, conversant,  No acute distress.  SKIN: No diaphoresis rash HEAD: Normocephalic, atraumatic  EYES: Conjunctiva/lids clear. Pupils round, reactive. EOMs intact.  EARS: External exam WNL, canals clear. Hearing grossly normal.  NOSE: No deformity or discharge.  MOUTH/THROAT: Lips w/o lesions  RESPIRATORY: Breathing is even, unlabored. Lung sounds are clear   CARDOVASCULAR: Heart RRR with 4/6 systolic murmur right upper sternal border,no rubs or gallops. No peripheral edema.   GASTROINTESTINAL: Abdomen is soft, non-tender,  not distended w/ normal bowel sounds. GENITOURINARY: Bladder non tender, not distended  MUSCULOSKELETAL: Left wrist forearm and cast NEUROLOGIC:  Cranial nerves 2-12 grossly intact. Moves all extremities  PSYCHIATRIC: Mood and affect with dementia, no behavioral issues  Patient Active Problem List   Diagnosis Date Noted  . HNP (herniated nucleus pulposus), cervical 05/10/2015      Labs reviewed: Basic Metabolic Panel:    Component Value Date/Time   NA 141 12/04/2019 1620   K 4.2 12/04/2019 1620   CL 107 12/04/2019 1620   CO2 25 12/04/2019 1620   GLUCOSE 125 (H) 12/04/2019 1620   BUN 20 12/04/2019 1620   CREATININE 1.67 (H) 12/04/2019 1620   CALCIUM 8.9 12/04/2019 1620   PROT 7.2 09/13/2013 1035   ALBUMIN 3.9 09/13/2013 1035   AST 15 09/13/2013 1035   ALT 8 09/13/2013 1035   ALKPHOS 63 09/13/2013 1035   BILITOT 0.3 09/13/2013 1035   GFRNONAA 27 (L) 12/04/2019 1620   GFRAA 32 (L) 12/04/2019 1620    Recent Labs    12/04/19 1620  NA 141  K 4.2  CL 107  CO2 25  GLUCOSE 125*  BUN 20  CREATININE 1.67*  CALCIUM 8.9   Liver Function Tests: No results for input(s): AST, ALT, ALKPHOS, BILITOT, PROT, ALBUMIN in the last 8760 hours. No results for input(s): LIPASE, AMYLASE in the last 8760 hours. No results for input(s): AMMONIA in the last 8760 hours. CBC: Recent Labs    12/04/19 1620  WBC 10.9*  NEUTROABS 9.4*  HGB 10.2*  HCT 33.3*  MCV 90.0  PLT 328   Lipid No results for input(s): CHOL, HDL, LDLCALC, TRIG in the last 8760 hours.  Cardiac Enzymes: No results for input(s): CKTOTAL, CKMB, CKMBINDEX, TROPONINI in the last 8760 hours. BNP: No results for input(s): BNP in the last 8760 hours. No results found for: MICROALBUR No results found for: HGBA1C No results found for: TSH No results found for: VITAMINB12 No results found for: FOLATE No results found for: IRON, TIBC, FERRITIN  Imaging and Procedures obtained prior to SNF admission: DG Forearm  Left  Result Date: 12/04/2019 CLINICAL DATA:  Left forearm pain and deformity and bruising secondary to a fall at home today. EXAM: LEFT FOREARM -  2 VIEW COMPARISON:  None. FINDINGS: There is an impacted fracture of the metaphysis of the distal left radius. Tiny avulsion of the ulnar styloid. Chronic arthritic changes in the wrist. Proximal radius and ulna appear normal. No elbow joint effusion. No dislocations. IMPRESSION: 1. Impacted fracture of the metaphysis of the distal left radius. 2. Tiny avulsion of the ulnar styloid. Electronically Signed   By: Lorriane Shire M.D.   On: 12/04/2019 18:09   CT Head Wo Contrast  Result Date: 12/04/2019 CLINICAL DATA:  Head trauma secondary to a fall this morning. Headache. Left arm pain and left leg pain and left wrist swelling. EXAM: CT HEAD WITHOUT CONTRAST CT CERVICAL SPINE WITHOUT CONTRAST TECHNIQUE: Multidetector CT imaging of the head and cervical spine was performed following the standard protocol without intravenous contrast. Multiplanar CT image reconstructions of the cervical spine were also generated. COMPARISON:  CT scan of the head dated 11/28/2015 and CT scan of the cervical spine dated 12/10/2015 FINDINGS: CT HEAD FINDINGS Brain: No evidence of acute infarction, hemorrhage, hydrocephalus, extra-axial collection or mass lesion/mass effect. Diffuse atrophy, most severe in the temporal lobes. Old tiny lacunar infarcts in the periventricular white matter and basal ganglia. Vascular: No hyperdense vessel or unexpected calcification. Skull: Normal. Negative for fracture or focal lesion. Sinuses/Orbits: Normal. Other: None CT CERVICAL SPINE FINDINGS Alignment: Chronic straightening and slight reversal of the cervical lordosis. Chronic 2 mm anterolisthesis of C3 on C4. Skull base and vertebrae: Patient has had posterior decompression from C3-4 through C7-T1. There is solid posterior fusion from C3 through T1. There is chronic hypertrophy of the transverse  ligament at C1-2, increased since the prior study but without significant compromise of the spinal canal. Soft tissues and spinal canal: The paraspinal soft tissues are normal. No visible canal hematoma. Disc levels: C2-3: No disc bulging or protrusion. Moderate right facet arthritis. C3-4 through C7-T1: Solid posterior fusion at each level. Posterior rods and screws in place with no evidence of loosening. No acute abnormalities. No change since the prior study. T1-2: No disc bulging or protrusion. Moderate right facet arthritis, chronic. Upper chest: Negative. Other: None IMPRESSION: 1. No acute intracranial abnormality. Diffuse atrophy, most severe in the temporal lobes. 2. No acute abnormality of the cervical spine. Chronic hypertrophy of the transverse ligament at C1-2, increased since the prior study but without significant compromise of the spinal canal. 3. Solid posterior fusion from C3 through T1. Electronically Signed   By: Lorriane Shire M.D.   On: 12/04/2019 18:01   CT Cervical Spine Wo Contrast  Result Date: 12/04/2019 CLINICAL DATA:  Head trauma secondary to a fall this morning. Headache. Left arm pain and left leg pain and left wrist swelling. EXAM: CT HEAD WITHOUT CONTRAST CT CERVICAL SPINE WITHOUT CONTRAST TECHNIQUE: Multidetector CT imaging of the head and cervical spine was performed following the standard protocol without intravenous contrast. Multiplanar CT image reconstructions of the cervical spine were also generated. COMPARISON:  CT scan of the head dated 11/28/2015 and CT scan of the cervical spine dated 12/10/2015 FINDINGS: CT HEAD FINDINGS Brain: No evidence of acute infarction, hemorrhage, hydrocephalus, extra-axial collection or mass lesion/mass effect. Diffuse atrophy, most severe in the temporal lobes. Old tiny lacunar infarcts in the periventricular white matter and basal ganglia. Vascular: No hyperdense vessel or unexpected calcification. Skull: Normal. Negative for fracture or  focal lesion. Sinuses/Orbits: Normal. Other: None CT CERVICAL SPINE FINDINGS Alignment: Chronic straightening and slight reversal of the cervical lordosis. Chronic 2 mm anterolisthesis of C3 on  C4. Skull base and vertebrae: Patient has had posterior decompression from C3-4 through C7-T1. There is solid posterior fusion from C3 through T1. There is chronic hypertrophy of the transverse ligament at C1-2, increased since the prior study but without significant compromise of the spinal canal. Soft tissues and spinal canal: The paraspinal soft tissues are normal. No visible canal hematoma. Disc levels: C2-3: No disc bulging or protrusion. Moderate right facet arthritis. C3-4 through C7-T1: Solid posterior fusion at each level. Posterior rods and screws in place with no evidence of loosening. No acute abnormalities. No change since the prior study. T1-2: No disc bulging or protrusion. Moderate right facet arthritis, chronic. Upper chest: Negative. Other: None IMPRESSION: 1. No acute intracranial abnormality. Diffuse atrophy, most severe in the temporal lobes. 2. No acute abnormality of the cervical spine. Chronic hypertrophy of the transverse ligament at C1-2, increased since the prior study but without significant compromise of the spinal canal. 3. Solid posterior fusion from C3 through T1. Electronically Signed   By: Lorriane Shire M.D.   On: 12/04/2019 18:01   CT HIP LEFT WO CONTRAST  Result Date: 12/04/2019 CLINICAL DATA:  Fall, left hip pain EXAM: CT OF THE LEFT HIP WITHOUT CONTRAST TECHNIQUE: Multidetector CT imaging of the left hip was performed according to the standard protocol. Multiplanar CT image reconstructions were also generated. COMPARISON:  Same-day x-ray FINDINGS: Bones/Joint/Cartilage There is an acute fracture involving the tip of the left greater trochanter which is displaced proximally by 1.4 cm. Fracture is mildly comminuted. Gluteus medius tendon appears intact inserting on the avulsed  fragment (series 5, images 19-22). The anterior aspect of the greater trochanter at the gluteus minimus insertion site is fractured without significant displacement. Fracture does not extend to the lesser trochanter, femoral neck, or femoral head. Left hip joint is intact without dislocation. There is a chronic appearing fracture deformity of the parasymphyseal aspect of the left pubic bone with irregular fragmentation. Fragmentations largely demonstrate corticated margins. There is overlying soft tissue thickening without hematoma. Ligaments Suboptimally assessed by CT. Muscles and Tendons Left gluteal tendon findings, as above. Remaining muscular and tendinous structures about the left hip are intact. Soft tissues No soft tissue hematoma. No acute findings within the visualized deep pelvis. IMPRESSION: 1. Acute fracture involving the tip of the left greater trochanter which is displaced proximally by 1.4 cm. 2. Chronic appearing fracture deformity of the parasymphyseal aspect of the left pubic bone with irregular fragmentation. Electronically Signed   By: Davina Poke M.D.   On: 12/04/2019 19:17   DG Hand Complete Left  Result Date: 12/04/2019 CLINICAL DATA:  Left wrist bruising and deformity secondary to a fall today. EXAM: LEFT HAND - COMPLETE 3+ VIEW COMPARISON:  Hand radiographs dated 01/24/2016 FINDINGS: There is an impacted slightly dorsally displaced and dorsally angulated fracture of the metaphysis of the distal left radius. Tiny avulsion of the ulnar styloid. Chronic severe degenerative changes in the wrist, stable. Diffuse arthritic changes in the IP joints. Chronic Radiodense foreign body in the soft tissues of the volar aspect of the base of the left index finger. IMPRESSION: 1. Acute fracture of the metaphysis of the distal left radius. 2. Tiny avulsion of the ulnar styloid. 3. Radiodense foreign body in the soft tissues of the base of the left index finger. 4. Diffuse arthritic changes,  stable. Electronically Signed   By: Lorriane Shire M.D.   On: 12/04/2019 18:08   DG Hip Unilat W or Wo Pelvis 2-3 Views Left  Result Date: 12/04/2019 CLINICAL DATA:  Left hip pain secondary to a fall today. EXAM: DG HIP (WITH OR WITHOUT PELVIS) 2-3V LEFT COMPARISON:  MRI dated 09/19/2016 FINDINGS: There is an avulsed distracted fracture of the tip of the left greater trochanter of the proximal left femur. There is no intertrochanteric extent of the fracture in the left femoral neck is intact. There is a deformity of the left pubic body which is new since the prior MRI. Is the patient tender over the symphysis pubis? This may be an old injury. Note is made of hardware in the right hip from previous open reduction internal fixation of proximal right femur fracture. IMPRESSION: 1. Acute avulsed fracture of the tip of the greater trochanter of the proximal left femur. 2. Deformity of the left pubic body which is new since the prior MRI. This may be an old injury. Electronically Signed   By: Lorriane Shire M.D.   On: 12/04/2019 18:05     Not all labs, radiology exams or other studies done during hospitalization come through on my EPIC note; however they are reviewed by me.    Assessment and Plan  Proximal left femur fracture-ORIF left hip/left radial fracture-vitamin D normal at 57 SNF-admitted for OT/PT; ASA 81 mg twice daily for prophylaxis; left radial fracture will continue in cast with follow-up with Ortho in 2 weeks  Pulmonary nodules found on CT of chest-concerning for malignancy.  We have decided to patient and her friend/medical POA bit she would pursue this after discharge from skilled nursing facility  Acute hypoxic respiratory failure-CT angio of the chest negative for PE; resolved  Rheumatoid arthritis SNF-Deltasone 5 mg 4 times a week, Bactrim DS 1 tablet every Monday Wednesday and Friday for prophylaxis for PCP, and continue Arava 20 mg Monday Wednesday Friday  Sunday  Dementia SNF-not stated as unstable; continue Aricept 10 mg twice daily and Namenda Exar 28 mg 1 p.o. daily  Hyperlipidemia SNF-not stated as uncontrolled; continue Lipitor 10 mg daily  Depression SNF-appears controlled; continue Wellbutrin 300 mg nightly and Remeron 30 mg nightly along with Zoloft 150 mg daily   Time spent greater than 45 minutes;> 50% of time with patient was spent reviewing records, labs, tests and studies, counseling and developing plan of care  Hennie Duos, MD

## 2019-12-19 ENCOUNTER — Encounter: Payer: Self-pay | Admitting: Internal Medicine

## 2019-12-19 DIAGNOSIS — E785 Hyperlipidemia, unspecified: Secondary | ICD-10-CM | POA: Insufficient documentation

## 2019-12-19 DIAGNOSIS — J9601 Acute respiratory failure with hypoxia: Secondary | ICD-10-CM | POA: Insufficient documentation

## 2019-12-19 DIAGNOSIS — Z9889 Other specified postprocedural states: Secondary | ICD-10-CM | POA: Insufficient documentation

## 2019-12-19 DIAGNOSIS — R918 Other nonspecific abnormal finding of lung field: Secondary | ICD-10-CM | POA: Insufficient documentation

## 2019-12-19 DIAGNOSIS — F329 Major depressive disorder, single episode, unspecified: Secondary | ICD-10-CM | POA: Insufficient documentation

## 2019-12-19 DIAGNOSIS — S72009A Fracture of unspecified part of neck of unspecified femur, initial encounter for closed fracture: Secondary | ICD-10-CM | POA: Insufficient documentation

## 2019-12-19 DIAGNOSIS — F0280 Dementia in other diseases classified elsewhere without behavioral disturbance: Secondary | ICD-10-CM | POA: Insufficient documentation

## 2019-12-19 DIAGNOSIS — Z96642 Presence of left artificial hip joint: Secondary | ICD-10-CM | POA: Insufficient documentation

## 2019-12-19 DIAGNOSIS — F32A Depression, unspecified: Secondary | ICD-10-CM | POA: Insufficient documentation

## 2019-12-19 DIAGNOSIS — M069 Rheumatoid arthritis, unspecified: Secondary | ICD-10-CM | POA: Insufficient documentation

## 2019-12-19 DIAGNOSIS — Z8781 Personal history of (healed) traumatic fracture: Secondary | ICD-10-CM | POA: Insufficient documentation

## 2019-12-19 DIAGNOSIS — S52509A Unspecified fracture of the lower end of unspecified radius, initial encounter for closed fracture: Secondary | ICD-10-CM | POA: Insufficient documentation

## 2019-12-19 DIAGNOSIS — B20 Human immunodeficiency virus [HIV] disease: Secondary | ICD-10-CM | POA: Insufficient documentation

## 2019-12-21 LAB — COMPREHENSIVE METABOLIC PANEL
Calcium: 9.5 (ref 8.7–10.7)
GFR calc Af Amer: 46.1
GFR calc non Af Amer: 39.78

## 2019-12-21 LAB — CBC AND DIFFERENTIAL
HCT: 31 — AB (ref 36–46)
Hemoglobin: 10 — AB (ref 12.0–16.0)
Neutrophils Absolute: 9
Platelets: 454 — AB (ref 150–399)
WBC: 10.9

## 2019-12-21 LAB — BASIC METABOLIC PANEL
BUN: 17 (ref 4–21)
CO2: 24 — AB (ref 13–22)
Chloride: 107 (ref 99–108)
Creatinine: 1.2 — AB (ref 0.5–1.1)
Glucose: 90
Potassium: 4.4 (ref 3.4–5.3)
Sodium: 144 (ref 137–147)

## 2019-12-21 LAB — CBC: RBC: 3.56 — AB (ref 3.87–5.11)

## 2019-12-22 ENCOUNTER — Other Ambulatory Visit: Payer: Self-pay | Admitting: Internal Medicine

## 2019-12-22 MED ORDER — HYDROCODONE-ACETAMINOPHEN 5-325 MG PO TABS: 1.0000 | ORAL_TABLET | Freq: Two times a day (BID) | ORAL | 0 refills | Status: DC

## 2019-12-30 ENCOUNTER — Other Ambulatory Visit: Payer: Self-pay | Admitting: Internal Medicine

## 2019-12-30 MED ORDER — HYDROCODONE-ACETAMINOPHEN 5-325 MG PO TABS: 1.0000 | ORAL_TABLET | Freq: Two times a day (BID) | ORAL | 0 refills | Status: DC

## 2020-01-05 ENCOUNTER — Non-Acute Institutional Stay (SKILLED_NURSING_FACILITY): Payer: Medicare HMO | Admitting: Internal Medicine

## 2020-01-05 ENCOUNTER — Encounter: Payer: Self-pay | Admitting: Internal Medicine

## 2020-01-05 DIAGNOSIS — Z96642 Presence of left artificial hip joint: Secondary | ICD-10-CM | POA: Diagnosis not present

## 2020-01-05 DIAGNOSIS — M0579 Rheumatoid arthritis with rheumatoid factor of multiple sites without organ or systems involvement: Secondary | ICD-10-CM

## 2020-01-05 DIAGNOSIS — R918 Other nonspecific abnormal finding of lung field: Secondary | ICD-10-CM

## 2020-01-05 DIAGNOSIS — S52502S Unspecified fracture of the lower end of left radius, sequela: Secondary | ICD-10-CM | POA: Diagnosis not present

## 2020-01-05 DIAGNOSIS — F32A Depression, unspecified: Secondary | ICD-10-CM

## 2020-01-05 DIAGNOSIS — F329 Major depressive disorder, single episode, unspecified: Secondary | ICD-10-CM

## 2020-01-05 NOTE — Progress Notes (Signed)
Location:    [facility] Room Number: 107/P Place of Service:  SNF [phone])  Provider: Lorne Skeens  PCP: Hennie Duos, MD Patient Care Team: Hennie Duos, MD as PCP - General (Internal Medicine)  Extended Emergency Contact Information Primary Emergency Contact: Karle Plumber Address: [address] [place], [place] [address] [place] Phone: [phone] Relation: Friend  Code Status: DNR Goals of care:  Advanced Directive information Advanced Directives 01/05/2020  Does Patient Have a Medical Advance Directive? Yes  Type of Paramedic of [place];Out of facility DNR (pink MOST or yellow form)  Does patient want to make changes to medical advance directive? No - Patient declined  Copy of [place] in Chart? Yes - validated most recent copy scanned in chart (See row information)  Pre-existing out of facility DNR order (yellow form or pink MOST form) Yellow form placed in chart (order not valid for inpatient use)     Allergies  Allergen Reactions  . Methotrexate Other (See Comments)    Severe pancytopenia  Severe pancytopenia    . Cymbalta [Duloxetine Hcl] Rash  . Duloxetine Rash    Rash with Itching 08/20/10    Chief Complaint  Patient presents with  . Discharge Note    Discharge Visit    HPI:  86 y.o. female here today for discharge from facility scheduled for Friday, January 07, 2020.  She has been here for rehab underst.  Left proximal femur fracture after a fall this was surgically repaired with ORIF.  CT of the chest without contrast also showed new pulmonary nodules concerning for malignancy-discussion was held with the patient and her friend and medical power of attorney and they decided to hold on any further testing until discharge from skilled nursing.  Her other diagnoses include rheumatoid arthritis treated with steroids and Arava-dementia treated  with Aricept and Namenda hyperlipidemia treated with Lipitor-she also has depression treated with bupropion Zoloft and Remeron in addition to hypertension she is on low-dose Norvasc.  Her stay here has been fairly unremarkable she continues with a cast on her left lower arm secondary to previous humerus fracture.  She continues to receive Norco for pain as per nursing this is adequate and they feel necessary.  She also has orders for Tylenol arthritis.  For prophylaxis DVT she is on aspirin 81 mg twice daily through January 28.  She continues on Bactrim DS 3 days a week for prophylaxis secondary to chronic steroid use-she also is on iron her hemoglobin appears to be stable on recent lab was 10.0.  Currently she has just finished working with therapy and appears to be doing quite well she is using a walker-she has no complaints today she is looking forward to going home.  She does live with a friend.  She will need PT OT as well as social work support as well as a Conservation officer, nature    Past Medical History:  Diagnosis Date  . Arthritis   . Cough    loose  started 05/05/15 no fever  . Dementia ([place])    STARTING SIGNS OF DEMENTIA  . Depression    TAKING ZOLOFT FOR A LONG TIME  . Headache(784.0)   . Hypertension   . Rheumatoid arthritis [facility])     Past Surgical History:  Procedure Laterality Date  . CARPAL TUNNEL RELEASE     RIGHT  . EYE SURGERY  BILATERAL CATARACTS  . FRACTURE SURGERY     RIGHT HIP-HAS SCREW  . POSTERIOR CERVICAL FUSION/FORAMINOTOMY N/A 09/20/2013   Procedure: CERVICAL THREE TO CERVICAL SEVEN  POSTERIOR CERVICAL FUSION;  Surgeon: Elaina Hoops, MD;  Location: [facility];  Service: Neurosurgery;  Laterality: N/A;  CERVICAL THREE TO CERVICAL SEVEN  POSTERIOR CERVICAL FUSION  . POSTERIOR CERVICAL FUSION/FORAMINOTOMY N/A 05/10/2015   Procedure: Posterior Cervical Laminectomy Diskectomy and Fusion Cervical seven Thoracic one. Exploration of Fusion Removal of Hardware  Cervical three to seven;  Surgeon: Kary Kos, MD;  Location: [facility];  Service: Neurosurgery;  Laterality: N/A;  . ROTATOR CUFF REPAIR     RIGHT SHOULDER  . SHOULDER ARTHROSCOPY     ? LEFT BONE SPURSS      reports that she has been smoking cigarettes. She has a 17.00 pack-year smoking history. She has never used smokeless tobacco. She reports that she does not drink alcohol or use drugs. Social History   Socioeconomic History  . Marital status: Divorced    Spouse name: Not on file  . Number of children: Not on file  . Years of education: Not on file  . Highest education level: Not on file  Occupational History  . Not on file  Tobacco Use  . Smoking status: Current Every Day Smoker    Packs/day: 0.25    Years: 68.00    Pack years: 17.00    Types: Cigarettes  . Smokeless tobacco: Never Used  Substance and Sexual Activity  . Alcohol use: No  . Drug use: No  . Sexual activity: Not on file  Other Topics Concern  . Not on file  Social History Narrative  . Not on file   Social Determinants of Health   Financial Resource Strain:   . Difficulty of Paying Living Expenses: Not on file  Food Insecurity:   . Worried About Charity fundraiser in the Last Year: Not on file  . Ran Out of Food in the Last Year: Not on file  Transportation Needs:   . Lack of Transportation (Medical): Not on file  . Lack of Transportation (Non-Medical): Not on file  Physical Activity:   . Days of Exercise per Week: Not on file  . Minutes of Exercise per Session: Not on file  Stress:   . Feeling of Stress : Not on file  Social Connections:   . Frequency of Communication with Friends and Family: Not on file  . Frequency of Social Gatherings with Friends and Family: Not on file  . Attends Religious Services: Not on file  . Active Member of Clubs or Organizations: Not on file  . Attends Archivist Meetings: Not on file  . Marital Status: Not on file  Intimate Partner Violence:   .  Fear of Current or Ex-Partner: Not on file  . Emotionally Abused: Not on file  . Physically Abused: Not on file  . Sexually Abused: Not on file   Functional Status Survey:    Allergies  Allergen Reactions  . Methotrexate Other (See Comments)    Severe pancytopenia  Severe pancytopenia    . Cymbalta [Duloxetine Hcl] Rash  . Duloxetine Rash    Rash with Itching 08/20/10    Pertinent  Health Maintenance Due  Topic Date Due  . DEXA SCAN  11/03/1998  . INFLUENZA VACCINE  07/24/2019  . PNA vac Low Risk Adult  Completed    Medications: Allergies as of 01/05/2020      Reactions  Methotrexate Other (See Comments)   Severe pancytopenia  Severe pancytopenia    Cymbalta [duloxetine Hcl] Rash   Duloxetine Rash   Rash with Itching 08/20/10      Medication List       Accurate as of January 05, 2020 12:29 PM. If you have any questions, ask your nurse or doctor.        STOP taking these medications   alendronate 70 MG tablet Commonly known as: FOSAMAX Stopped by: Granville Lewis, PA-C   cyclobenzaprine 10 MG tablet Commonly known as: FLEXERIL Stopped by: Granville Lewis, PA-C   doxycycline 100 MG DR capsule Commonly known as: DORYX Stopped by: Granville Lewis, PA-C   [place] by: Granville Lewis, PA-C   oxyCODONE-acetaminophen 5-325 MG tablet Commonly known as: PERCOCET/ROXICET Stopped by: Granville Lewis, PA-C   pravastatin 40 MG tablet Commonly known as: PRAVACHOL Stopped by: Granville Lewis, PA-C   propranolol 20 MG tablet Commonly known as: INDERAL Stopped by: Granville Lewis, PA-C   Pseudoephedrine-APAP-DM 630 841 2230 MG/15ML Liqd Stopped by: Granville Lewis, PA-C   traZODone 50 MG tablet Commonly known as: DESYREL Stopped by: Granville Lewis, PA-C     TAKE these medications   acetaminophen 325 MG tablet Commonly known as: TYLENOL Take 650 mg by mouth every 6 (six) hours as needed (For Pain). What changed: Another medication with the same name was removed. Continue  taking this medication, and follow the directions you see here. Changed by: Granville Lewis, PA-C   acetaminophen 650 MG CR tablet Commonly known as: TYLENOL Take 650 mg by mouth 2 (two) times daily. For Pain What changed: Another medication with the same name was removed. Continue taking this medication, and follow the directions you see here. Changed by: Granville Lewis, PA-C   albuterol 108 (90 Base) MCG/ACT inhaler Commonly known as: VENTOLIN HFA Inhale 2 puffs into the lungs every 6 (six) hours as needed for wheezing or shortness of breath.   amLODipine 5 MG tablet Commonly known as: NORVASC Take 2.5 mg by mouth daily.   ascorbic acid 500 MG tablet Commonly known as: VITAMIN C Take 500 mg by mouth daily.   aspirin EC 81 MG tablet 81 mg. TAKE TABLET BY MOUTH IN THE MORNING AND AT BEDTIME FOR DVT PROPHYLAXIS X 4 MORE WEEKS   atorvastatin 10 MG tablet Commonly known as: LIPITOR Take 10 mg by mouth at bedtime. FOR HYPERCHOLESTEROLEMIA   BACTRIM DS PO Take by mouth. TAKE 1 TABLET BY MOUTH EVERY MONDAY - WEDNESDAY - FRIDAY FOR PCP PROPHYLAXIS DUE TO CHRONIC STEROID USE   buPROPion 300 MG 24 hr tablet Commonly known as: WELLBUTRIN XL Take 300 mg by mouth at bedtime.   calcium carbonate 600 MG Tabs tablet Commonly known as: OS-CAL Take 600 mg by mouth 2 (two) times daily with a meal.   donepezil 10 MG tablet Commonly known as: ARICEPT Take 10 mg by mouth 2 (two) times daily.   Ensure Take 237 mLs by mouth 2 (two) times daily. For risk of malnutrition   ferrous sulfate 325 (65 FE) MG tablet Take 325 mg by mouth 2 (two) times daily with a meal. For Anemia   HYDROcodone-acetaminophen 5-325 MG tablet Commonly known as: NORCO/VICODIN Take 1 tablet by mouth 2 (two) times daily.   leflunomide 20 MG tablet Commonly known as: ARAVA Take 20 mg by mouth daily. Take on M-W-F-Su   memantine 28 MG Cp24 24 hr capsule Commonly known as: NAMENDA XR Take 28 mg by mouth daily.  mirtazapine 30 MG tablet Commonly known as: REMERON Take 30 mg by mouth every evening.   multivitamin with minerals Tabs tablet Take 1 tablet by mouth daily.   omeprazole 20 MG capsule Commonly known as: PRILOSEC Take 20 mg by mouth daily.   polyethylene glycol 17 g packet Commonly known as: MIRALAX / GLYCOLAX Take 17 g by mouth daily.   potassium chloride SA 20 MEQ tablet Commonly known as: KLOR-CON Take 20 mEq by mouth daily.   predniSONE 10 MG tablet Commonly known as: DELTASONE Take 5 mg by mouth daily. Take 1 tablet (5 mg) on Monday, Wednesday, Friday, Sunday   senna-docusate 8.6-50 MG tablet Commonly known as: Senokot-S Take 1 tablet by mouth every evening.   sertraline 100 MG tablet Commonly known as: ZOLOFT Take 150 mg by mouth daily.   vitamin B-12 1000 MCG tablet Commonly known as: CYANOCOBALAMIN Take 1,000 mcg by mouth daily.       Review of Systems   In general she is not complaining of any fever chills says she feels well looking forward to going home.  Skin does not complain of rashes or itching.  Head ears eyes nose mouth and throat does not complain of visual changes or sore throat.  Respiratory no complaints of shortness of breath or cough.  Cardiac does not complain of chest pain or edema.  GI is not complaining of any abdominal discomfort nausea vomiting diarrhea constipation.  GU no complaints of dysuria.  Musculoskeletal does have some joint-hip pain discomfort at times per nursing sht Norco has been effective.  Neurologic is not complaining of dizziness headache or numbness appears to have good strength.  Psych does not complain overtly of being depressed or anxious appears to be in good spirits looking forward to going home she does have a diagnosis of dementia with some baseline cognitive deficits    Vitals:   01/05/20 1116  BP: 120/68  Pulse: 95  Resp: 18  Temp: (!) 97 F (36.1 C)  TempSrc: Oral  SpO2: 98%  Weight: 98 lb  14.4 oz (44.9 kg)  Height: 5\' 4"  (1.626 m)   Body mass index is 16.98 kg/m. Physical Exam   In general this is a very pleasant elderly female in no distress-she is bright and alert.  Her skin is warm and dry.  Eyes visual acuity appears to be intact sclera and conjunctive are clear.  Oropharynx is clear mucous membranes moist.  Chest is clear to auscultation there is no labored breathing.  Heart is regular rate and rhythm slightly distant heart sounds she does have a history of a systolic murmur-she does not really have significant lower extremity edema.  Abdomen is soft nontender with active bowel sounds.  Musculoskeletal does move all extremities x4 it appears her baseline strength-- she does have a cast applied to her lower left arm-her fingers on the left have capillary refill grip strength appears to be intact.  She is ambulating fairly well with a rolling walker.  Neurologic appears grossly intact her speech is clear cannot really appreciate lateralizing findings.  Psych she continues to be pleasant and appropriate with some mild cognitive deficits with history of dementia  Labs reviewed:  December 20, 2020.  WBC 10.9 hemoglobin 10.0 platelets 454.  Sodium 144 potassium 4.4 BUN 16.7 creatinine 1.23.   Basic Metabolic Panel: Recent Labs    12/04/19 1620  NA 141  K 4.2  CL 107  CO2 25  GLUCOSE 125*  BUN 20  CREATININE 1.67*  CALCIUM 8.9   Liver Function Tests: No results for input(s): AST, ALT, ALKPHOS, BILITOT, PROT, ALBUMIN in the last 8760 hours. No results for input(s): LIPASE, AMYLASE in the last 8760 hours. No results for input(s): AMMONIA in the last 8760 hours. CBC: Recent Labs    12/04/19 1620  WBC 10.9*  NEUTROABS 9.4*  HGB 10.2*  HCT 33.3*  MCV 90.0  PLT 328   Cardiac Enzymes: No results for input(s): CKTOTAL, CKMB, CKMBINDEX, TROPONINI in the last 8760 hours. BNP: Invalid input(s): POCBNP CBG: No results for input(s): GLUCAP in  the last 8760 hours.  Procedures and Imaging Studies During Stay: No results found.  Assessment/Plan:    #1 history of proximal left femur fracture status post ORIF-with previous history of a left radial fracture--she has received PT and OT and has done quite well she is on aspirin 81 mg twice daily for prophylaxis until January 28-she will need follow-up with orthopedics for both issues but appears to be doing well.  She does receive Norco twice daily for pain and per nursing this is necessary and effective will defer to primary care provider and orthopedics for follow-up. She will be receiving continued PT and OT on discharge as well as Education officer, museum support-and will need a rolling walker to assist with ambulation  2.  History of pulmonary nodules found on CT of chest concerning for malignancy-as noted above patient and her friend of medical power of attorney will pursue this apparently after discharge.  3.  History of rheumatoid arthritis she continues on prednisone 5 mg 4 times a week she is on Bactrim 3 times a week for prophylaxis for PCP-she also continues on Arava 20 mg Monday Wednesday Friday and Sunday.  4.  Dementia this appears to be stable without behavioral disturbances she is on Aricept 10 mg twice a day Namenda standard release 28 mg a day.  5.  Depression this appears stable on Zoloft 150 mg a day-Remeron 30 mg a day-and bupropion 300 mg a day.  6.  Anemia this appears stable with hemoglobin of 10.0 on lab in late [place] does continue on iron.  7.  History of chronic kidney disease creatinine appears to have improved actually and most recent lab on December 29 at 1.23 previously was 1.67-she is on potassium supplementation as well-we will update a BMP before discharge.  8.  History of hyperlipidemia since her stay here was quite shortwas not aggressive pursuing a lipid panel she is on Lipitor 10 mg a day.  9.  History of COPD she continues on albuterol inhaler as  needed-to my knowledge this has not really been an issue during her stay here.  10.  History of hypertension she is on low-dose Norvasc 2.5 mg a day recent blood pressures 120/68-143/80-will defer to primary care provider for follow-up -- she does not appear to have significant consistent elevations  Again she is slated to go home on Friday, January 15 she will need PT OT and a rolling walker she will be with her longtime friend-she will need orthopedic follow-upt suspect pulmonary follow-up as well as noted above  [id number]-of note greater than 30 minutes spent on this discharge summary-greater than 50% of time spent coordinating a plan of care for numerous diagnoses    Addendum-we did do updated labs on January 14-which are reassuring with a sodium of 138 potassium 4.6 BUN of 14.2 and creatinine of 1.03.

## 2020-01-06 LAB — BASIC METABOLIC PANEL
BUN: 14 (ref 4–21)
CO2: 24 — AB (ref 13–22)
Chloride: 102 (ref 99–108)
Creatinine: 1 (ref 0.5–1.1)
Glucose: 126
Potassium: 4.6 (ref 3.4–5.3)
Sodium: 138 (ref 137–147)

## 2020-01-06 LAB — COMPREHENSIVE METABOLIC PANEL
Calcium: 9.6 (ref 8.7–10.7)
GFR calc Af Amer: 57.12
GFR calc non Af Amer: 49.28

## 2020-01-07 MED ORDER — HYDROCODONE-ACETAMINOPHEN 5-325 MG PO TABS: 1.0000 | ORAL_TABLET | Freq: Two times a day (BID) | ORAL | 0 refills | Status: DC

## 2020-01-07 MED ORDER — AMLODIPINE BESYLATE 5 MG PO TABS: 2.5000 mg | ORAL_TABLET | Freq: Every day | ORAL | 0 refills | Status: DC

## 2020-01-07 MED ORDER — LEFLUNOMIDE 20 MG PO TABS: 20.0000 mg | ORAL_TABLET | Freq: Every day | ORAL | 0 refills | Status: DC

## 2020-01-07 MED ORDER — FERROUS SULFATE 325 (65 FE) MG PO TABS: 325.0000 mg | ORAL_TABLET | Freq: Two times a day (BID) | ORAL | 0 refills | Status: AC

## 2020-01-07 MED ORDER — ALBUTEROL SULFATE HFA 108 (90 BASE) MCG/ACT IN AERS: 2.0000 | INHALATION_SPRAY | Freq: Four times a day (QID) | RESPIRATORY_TRACT | 0 refills | Status: DC | PRN

## 2020-01-07 MED ORDER — ATORVASTATIN CALCIUM 10 MG PO TABS: 10.0000 mg | ORAL_TABLET | Freq: Every day | ORAL | 0 refills | Status: DC

## 2020-01-07 MED ORDER — ASCORBIC ACID 500 MG PO TABS: 500.0000 mg | ORAL_TABLET | Freq: Every day | ORAL | 0 refills | Status: AC

## 2020-01-07 MED ORDER — SERTRALINE HCL 100 MG PO TABS: 150.0000 mg | ORAL_TABLET | Freq: Every day | ORAL | 0 refills | Status: DC

## 2020-01-07 MED ORDER — DONEPEZIL HCL 10 MG PO TABS: 10.0000 mg | ORAL_TABLET | Freq: Two times a day (BID) | ORAL | 0 refills | Status: DC

## 2020-01-07 MED ORDER — MEMANTINE HCL ER 28 MG PO CP24: 28.0000 mg | ORAL_CAPSULE | Freq: Every day | ORAL | 0 refills | Status: DC

## 2020-01-07 MED ORDER — BUPROPION HCL ER (XL) 300 MG PO TB24: 300.0000 mg | ORAL_TABLET | Freq: Every day | ORAL | 0 refills | Status: DC

## 2020-01-07 MED ORDER — VITAMIN B-12 1000 MCG PO TABS: 1000.0000 ug | ORAL_TABLET | Freq: Every day | ORAL | 0 refills | Status: DC

## 2020-01-07 MED ORDER — OMEPRAZOLE 20 MG PO CPDR: 20.0000 mg | DELAYED_RELEASE_CAPSULE | Freq: Every day | ORAL | 0 refills | Status: DC

## 2020-01-07 MED ORDER — POTASSIUM CHLORIDE CRYS ER 20 MEQ PO TBCR: 20.0000 meq | EXTENDED_RELEASE_TABLET | Freq: Every day | ORAL | 0 refills | Status: DC

## 2020-01-07 MED ORDER — MIRTAZAPINE 30 MG PO TABS: 30.0000 mg | ORAL_TABLET | Freq: Every evening | ORAL | 0 refills | Status: DC

## 2020-01-07 MED ORDER — SULFAMETHOXAZOLE-TRIMETHOPRIM 800-160 MG PO TABS: ORAL_TABLET | ORAL | 0 refills | Status: DC

## 2020-01-07 MED ORDER — PREDNISONE 10 MG PO TABS: 5.0000 mg | ORAL_TABLET | Freq: Every day | ORAL | 0 refills | Status: DC

## 2020-02-02 LAB — POCT INR: INR: 1 (ref 0.9–1.1)

## 2020-02-02 LAB — BASIC METABOLIC PANEL
BUN: 15 (ref 4–21)
CO2: 25 — AB (ref 13–22)
Chloride: 109 — AB (ref 99–108)
Creatinine: 0.8 (ref 0.5–1.1)
Potassium: 3.5 (ref 3.4–5.3)
Sodium: 140 (ref 137–147)

## 2020-02-02 LAB — CBC AND DIFFERENTIAL
HCT: 27 — AB (ref 36–46)
Hemoglobin: 8.8 — AB (ref 12.0–16.0)
Platelets: 401 — AB (ref 150–399)
WBC: 13.2

## 2020-02-02 LAB — COMPREHENSIVE METABOLIC PANEL: Calcium: 7.7 — AB (ref 8.7–10.7)

## 2020-02-04 ENCOUNTER — Other Ambulatory Visit: Payer: Self-pay | Admitting: Internal Medicine

## 2020-02-04 MED ORDER — HYDROCODONE-ACETAMINOPHEN 5-325 MG PO TABS: 1.0000 | ORAL_TABLET | Freq: Two times a day (BID) | ORAL | 0 refills | Status: DC

## 2020-02-07 ENCOUNTER — Other Ambulatory Visit: Payer: Self-pay | Admitting: Internal Medicine

## 2020-02-07 MED ORDER — HYDROCODONE-ACETAMINOPHEN 5-325 MG PO TABS: 1.0000 | ORAL_TABLET | Freq: Two times a day (BID) | ORAL | 0 refills | Status: DC

## 2020-02-08 ENCOUNTER — Encounter: Payer: Self-pay | Admitting: Internal Medicine

## 2020-02-08 ENCOUNTER — Non-Acute Institutional Stay (SKILLED_NURSING_FACILITY): Payer: Medicare HMO | Admitting: Internal Medicine

## 2020-02-08 DIAGNOSIS — S72002S Fracture of unspecified part of neck of left femur, sequela: Secondary | ICD-10-CM

## 2020-02-08 DIAGNOSIS — I2699 Other pulmonary embolism without acute cor pulmonale: Secondary | ICD-10-CM | POA: Diagnosis not present

## 2020-02-08 DIAGNOSIS — I10 Essential (primary) hypertension: Secondary | ICD-10-CM

## 2020-02-08 DIAGNOSIS — I9581 Postprocedural hypotension: Secondary | ICD-10-CM

## 2020-02-08 DIAGNOSIS — K219 Gastro-esophageal reflux disease without esophagitis: Secondary | ICD-10-CM

## 2020-02-08 DIAGNOSIS — F0280 Dementia in other diseases classified elsewhere without behavioral disturbance: Secondary | ICD-10-CM

## 2020-02-08 DIAGNOSIS — F32A Depression, unspecified: Secondary | ICD-10-CM

## 2020-02-08 DIAGNOSIS — R918 Other nonspecific abnormal finding of lung field: Secondary | ICD-10-CM

## 2020-02-08 DIAGNOSIS — E785 Hyperlipidemia, unspecified: Secondary | ICD-10-CM

## 2020-02-08 DIAGNOSIS — M0579 Rheumatoid arthritis with rheumatoid factor of multiple sites without organ or systems involvement: Secondary | ICD-10-CM

## 2020-02-08 DIAGNOSIS — B20 Human immunodeficiency virus [HIV] disease: Secondary | ICD-10-CM

## 2020-02-08 DIAGNOSIS — F329 Major depressive disorder, single episode, unspecified: Secondary | ICD-10-CM

## 2020-02-08 NOTE — Progress Notes (Signed)
: Provider:  Hennie Duos., MD Location:  [place] Room Number: 102-H Place of Service:  SNF [phone])  PCP: Hennie Duos, MD Patient Care Team: Hennie Duos, MD as PCP - General (Internal Medicine)  Extended Emergency Contact Information Primary Emergency Contact: Karle Plumber Address: [address]          [place], [place] [address] [place] Phone: [phone] Relation: Friend     Allergies: Methotrexate, Cymbalta [duloxetine hcl], and Duloxetine  Chief Complaint  Patient presents with  . New Admit To SNF    New admission to [facility] SNF    HPI: Patient is an 86 y.o. female COPD, depression, dementia, hypercholesterolemia, hyperlipidemia, hypertension, arthritis, chronic kidney disease stage III and GERD who was admitted to [facility] from 2/2-15 who presented to the hospital with a fracture of the left hip due to osteoporosis with nonunion.  She underwent IM nail removal on January 25, 2020.  Patient underwent total hip arthroplasty on 2/6: She had 2 separate hip surgeries).  Postop patient had hypotension requiring phenylephrine pressor requiring transfer to the ICU.  After transfer from ICU back to regular floor patient developed progressive hypoxia.  CT angio revealed bilateral PEs.  Her CT scan also showed a spiculated mass of the left lower lobe which patient family had known about previously.  They had decided not to pursue biopsy or further work-up until she recovered from her chronic illness.  Patient was started on Eliquis 10 mg twice daily for 7 days and 5 mg twice daily.  Patient was on room air at the time of discharge.  Patient is admitted to skilled nursing facility for OT/PT.  While at skilled nursing facility patient will be followed for hypertension treated with Norvasc, dementia treated with Aricept and Namenda, rheumatoid arthritis treated with [place] and hyperlipidemia treated  with Lipitor..  Past Medical History:  Diagnosis Date  . Arthritis   . Cough    loose  started 05/05/15 no fever  . Dementia ([place])    STARTING SIGNS OF DEMENTIA  . Depression    TAKING ZOLOFT FOR A LONG TIME  . Headache(784.0)   . Hypertension   . Rheumatoid arthritis [facility])     Past Surgical History:  Procedure Laterality Date  . CARPAL TUNNEL RELEASE     RIGHT  . EYE SURGERY     BILATERAL CATARACTS  . FRACTURE SURGERY     RIGHT HIP-HAS SCREW  . POSTERIOR CERVICAL FUSION/FORAMINOTOMY N/A 09/20/2013   Procedure: CERVICAL THREE TO CERVICAL SEVEN  POSTERIOR CERVICAL FUSION;  Surgeon: Elaina Hoops, MD;  Location: [place] NEURO ORS;  Service: Neurosurgery;  Laterality: N/A;  CERVICAL THREE TO CERVICAL SEVEN  POSTERIOR CERVICAL FUSION  . POSTERIOR CERVICAL FUSION/FORAMINOTOMY N/A 05/10/2015   Procedure: Posterior Cervical Laminectomy Diskectomy and Fusion Cervical seven Thoracic one. Exploration of Fusion Removal of Hardware Cervical three to seven;  Surgeon: Kary Kos, MD;  Location: [place] NEURO ORS;  Service: Neurosurgery;  Laterality: N/A;  . ROTATOR CUFF REPAIR     RIGHT SHOULDER  . SHOULDER ARTHROSCOPY     ? LEFT BONE SPURSS    Allergies as of 02/08/2020      Reactions   Methotrexate Other (See Comments)   Severe pancytopenia  Severe pancytopenia    Cymbalta [duloxetine Hcl] Rash   Duloxetine Rash   Rash with Itching 08/20/10      Medication List  Accurate as of February 08, 2020  9:58 AM. If you have any questions, ask your nurse or doctor.        STOP taking these medications   polyethylene glycol 17 g packet Commonly known as: MIRALAX / GLYCOLAX Stopped by: Inocencio Homes, MD     TAKE these medications   acetaminophen 325 MG tablet Commonly known as: TYLENOL Take 650 mg by mouth every 6 (six) hours as needed (For Pain). What changed: Another medication with the same name was removed. Continue taking this medication, and follow the directions you see  here. Changed by: Inocencio Homes, MD   albuterol 108 (90 Base) MCG/ACT inhaler Commonly known as: VENTOLIN HFA Inhale 2 puffs into the lungs every 6 (six) hours as needed for wheezing or shortness of breath.   amLODipine 5 MG tablet Commonly known as: NORVASC Take 0.5 tablets (2.5 mg total) by mouth daily.   ascorbic acid 500 MG tablet Commonly known as: VITAMIN C Take 1 tablet (500 mg total) by mouth daily.   atorvastatin 10 MG tablet Commonly known as: LIPITOR Take 1 tablet (10 mg total) by mouth at bedtime. FOR HYPERCHOLESTEROLEMIA   buPROPion 300 MG 24 hr tablet Commonly known as: WELLBUTRIN XL Take 1 tablet (300 mg total) by mouth at bedtime.   calcium carbonate 600 MG Tabs tablet Commonly known as: OS-CAL Take 600 mg by mouth daily with breakfast.   donepezil 10 MG tablet Commonly known as: ARICEPT Take 1 tablet (10 mg total) by mouth 2 (two) times daily.   Eliquis 5 MG Tabs tablet Generic drug: apixaban Take 10 mg by mouth 2 (two) times daily. Take 2 tablets to = 10 mg until 02/09/20. Change to one 5 mg tablet BID on 02/10/20   Eliquis 5 MG Tabs tablet Generic drug: apixaban Take 5 mg by mouth 2 (two) times daily. Start taking on: February 10, 2020   Ensure Take 237 mLs by mouth 2 (two) times daily. Lactose reduced   ferrous sulfate 325 (65 FE) MG tablet Take 1 tablet (325 mg total) by mouth 2 (two) times daily with a meal. For Anemia   HYDROcodone-acetaminophen 5-325 MG tablet Commonly known as: NORCO/VICODIN Take 1 tablet by mouth 2 (two) times daily.   leflunomide 20 MG tablet Commonly known as: ARAVA Take 1 tablet (20 mg total) by mouth daily. Take on M-W-F-Su   memantine 28 MG Cp24 24 hr capsule Commonly known as: NAMENDA XR Take 1 capsule (28 mg total) by mouth daily.   mirtazapine 30 MG tablet Commonly known as: REMERON Take 1 tablet (30 mg total) by mouth every evening.   multivitamin with minerals Tabs tablet Take 1 tablet by mouth daily.    omeprazole 20 MG capsule Commonly known as: PRILOSEC Take 1 capsule (20 mg total) by mouth daily.   potassium chloride SA 20 MEQ tablet Commonly known as: KLOR-CON Take 1 tablet (20 mEq total) by mouth daily.   predniSONE 5 MG tablet Commonly known as: DELTASONE Take 5 mg by mouth 4 (four) times daily. Take on M-W-F-Sun What changed: Another medication with the same name was removed. Continue taking this medication, and follow the directions you see here. Changed by: Inocencio Homes, MD   senna-docusate 8.6-50 MG tablet Commonly known as: Senokot-S Take 1 tablet by mouth every evening.   sertraline 100 MG tablet Commonly known as: ZOLOFT Take 1.5 tablets (150 mg total) by mouth daily.   sulfamethoxazole-trimethoprim 800-160 MG tablet Commonly known as: Bactrim DS TAKE 1  TABLET BY MOUTH EVERY MONDAY - WEDNESDAY - [place] PCP PROPHYLAXIS DUE TO CHRONIC STEROID USE   vitamin B-12 1000 MCG tablet Commonly known as: CYANOCOBALAMIN Take 1 tablet (1,000 mcg total) by mouth daily.       No orders of the defined types were placed in this encounter.   Immunization History  Administered Date(s) Administered  . Influenza, High Dose Seasonal PF 09/26/2015, 09/29/2018, 09/26/2019  . Influenza,inj,Quad PF,6+ Mos 09/21/2013  . Moderna SARS-COVID-2 Vaccination 12/23/2019  . Pneumococcal Conjugate-13 12/09/2016  . Pneumococcal Polysaccharide-23 12/23/1996, 02/28/2005, 09/22/2013  . Td 12/23/1997  . Tdap 09/30/2009, 07/09/2019  . Zoster 01/02/2012    Social History   Tobacco Use  . Smoking status: Current Every Day Smoker    Packs/day: 0.25    Years: 68.00    Pack years: 17.00    Types: Cigarettes  . Smokeless tobacco: Never Used  Substance Use Topics  . Alcohol use: No    Family history is Family History  Medical History Relation Name Comments  Cancer Brother    Stroke Brother    Heart disease Father    Stroke Sister    Breast cancer Neg Hx         History reviewed. No pertinent family history.    Review of Systems States she GENERAL:  no fevers, fatigue, appetite changes SKIN: No itching, or rash EYES: No eye pain, redness, discharge EARS: No earache, tinnitus, change in hearing NOSE: No congestion, drainage or bleeding  MOUTH/THROAT: No mouth or tooth pain, No sore throat RESPIRATORY: No cough, wheezing, SOB CARDIAC: No chest pain, palpitations, lower extremity edema  GI: No abdominal pain, No N/V/D or constipation, No heartburn or reflux  GU: No dysuria, frequency or urgency, or incontinence  MUSCULOSKELETAL: No unrelieved bone/joint pain NEUROLOGIC: No headache, dizziness or focal weakness PSYCHIATRIC: No c/o anxiety or sadness   Vitals:   02/08/20 0854  BP: 125/69  Pulse: 96  Resp: 18  Temp: (!) 97.3 F (36.3 C)  SpO2: 98%    SpO2 Readings from Last 1 Encounters:  02/08/20 98%   Body mass index is 16.98 kg/m.     Physical Exam  GENERAL APPEARANCE: Alert, conversant,  No acute distress.  SKIN: Incision area without redness or heat HEAD: Normocephalic, atraumatic  EYES: Conjunctiva/lids clear. Pupils round, reactive. EOMs intact.  EARS: External exam WNL, canals clear. Hearing grossly normal.  NOSE: No deformity or discharge.  MOUTH/THROAT: Lips w/o lesions  RESPIRATORY: Breathing is even, unlabored. Lung sounds are clear   CARDIOVASCULAR: Heart RRR no murmurs, rubs or gallops. No peripheral edema.   GASTROINTESTINAL: Abdomen is soft, non-tender, not distended w/ normal bowel sounds. GENITOURINARY: Bladder non tender, not distended  MUSCULOSKELETAL: No abnormal joints or musculature NEUROLOGIC:  Cranial nerves 2-12 grossly intact. Moves all extremities  PSYCHIATRIC: Mood and affect with dementia, no behavioral issues  Patient Active Problem List   Diagnosis Date Noted  . Fracture, proximal femur ([place]) 12/19/2019  . History of total left hip arthroplasty 12/19/2019  . Radius distal fracture  12/19/2019  . Pulmonary nodules/lesions, multiple 12/19/2019  . Acute respiratory failure with hypoxia ([place]) 12/19/2019  . Rheumatoid arthritis ([place]) 12/19/2019  . 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 Hyperlipidemia 12/19/2019  . Depression 12/19/2019  . HNP (herniated nucleus pulposus), cervical 05/10/2015      Labs reviewed: Basic Metabolic Panel:    Component Value Date/Time   NA 140 02/02/2020 0000   K 3.5 02/02/2020 0000   CL  109 (A) 02/02/2020 0000   CO2 25 (A) 02/02/2020 0000   GLUCOSE 125 (H) 12/04/2019 1620   BUN 15 02/02/2020 0000   CREATININE 0.8 02/02/2020 0000   CREATININE 1.67 (H) 12/04/2019 1620   CALCIUM 7.7 (A) 02/02/2020 0000   PROT 7.2 09/13/2013 1035   ALBUMIN 3.9 09/13/2013 1035   AST 15 09/13/2013 1035   ALT 8 09/13/2013 1035   ALKPHOS 63 09/13/2013 1035   BILITOT 0.3 09/13/2013 1035   GFRNONAA 49.28 01/06/2020 0000   GFRAA 57.12 01/06/2020 0000    Recent Labs    12/04/19 1620 12/04/19 1620 12/21/19 0000 01/06/20 0000 02/02/20 0000  NA 141  --  144 138 140  K 4.2   < > 4.4 4.6 3.5  CL 107   < > 107 102 109*  CO2 25   < > 24* 24* 25*  GLUCOSE 125*  --   --   --   --   BUN 20  --  17 14 15   CREATININE 1.67*  --  1.2* 1.0 0.8  CALCIUM 8.9   < > 9.5 9.6 7.7*   < > = values in this interval not displayed.   Liver Function Tests: No results for input(s): AST, ALT, ALKPHOS, BILITOT, PROT, ALBUMIN in the last 8760 hours. No results for input(s): LIPASE, AMYLASE in the last 8760 hours. No results for input(s): AMMONIA in the last 8760 hours. CBC: Recent Labs    12/04/19 1620 12/21/19 0000 02/02/20 0000  WBC 10.9* 10.9 13.2  NEUTROABS 9.4* 9  --   HGB 10.2* 10.0* 8.8*  HCT 33.3* 31* 27*  MCV 90.0  --   --   PLT 328 454* 401*   Lipid No results for input(s): CHOL, HDL, LDLCALC, TRIG in the last 8760 hours.  Cardiac Enzymes: No results for input(s): CKTOTAL, CKMB, CKMBINDEX, TROPONINI in the last 8760  hours. BNP: No results for input(s): BNP in the last 8760 hours. No results found for: MICROALBUR No results found for: HGBA1C No results found for: TSH No results found for: VITAMINB12 No results found for: FOLATE No results found for: IRON, TIBC, FERRITIN  Imaging and Procedures obtained prior to SNF admission: DG Forearm Left  Result Date: 12/04/2019 CLINICAL DATA:  Left forearm pain and deformity and bruising secondary to a fall at home today. EXAM: LEFT FOREARM - 2 VIEW COMPARISON:  None. FINDINGS: There is an impacted fracture of the metaphysis of the distal left radius. Tiny avulsion of the ulnar styloid. Chronic arthritic changes in the wrist. Proximal radius and ulna appear normal. No elbow joint effusion. No dislocations. IMPRESSION: 1. Impacted fracture of the metaphysis of the distal left radius. 2. Tiny avulsion of the ulnar styloid. Electronically Signed   By: Lorriane Shire M.D.   On: 12/04/2019 18:09   CT Head Wo Contrast  Result Date: 12/04/2019 CLINICAL DATA:  Head trauma secondary to a fall this morning. Headache. Left arm pain and left leg pain and left wrist swelling. EXAM: CT HEAD WITHOUT CONTRAST CT CERVICAL SPINE WITHOUT CONTRAST TECHNIQUE: Multidetector CT imaging of the head and cervical spine was performed following the standard protocol without intravenous contrast. Multiplanar CT image reconstructions of the cervical spine were also generated. COMPARISON:  CT scan of the head dated 11/28/2015 and CT scan of the cervical spine dated 12/10/2015 FINDINGS: CT HEAD FINDINGS Brain: No evidence of acute infarction, hemorrhage, hydrocephalus, extra-axial collection or mass lesion/mass effect. Diffuse atrophy, most severe in the temporal lobes. Old tiny  lacunar infarcts in the periventricular white matter and basal ganglia. Vascular: No hyperdense vessel or unexpected calcification. Skull: Normal. Negative for fracture or focal lesion. Sinuses/Orbits: Normal. Other: None CT  CERVICAL SPINE FINDINGS Alignment: Chronic straightening and slight reversal of the cervical lordosis. Chronic 2 mm anterolisthesis of C3 on C4. Skull base and vertebrae: Patient has had posterior decompression from C3-4 through C7-T1. There is solid posterior fusion from C3 through T1. There is chronic hypertrophy of the transverse ligament at C1-2, increased since the prior study but without significant compromise of the spinal canal. Soft tissues and spinal canal: The paraspinal soft tissues are normal. No visible canal hematoma. Disc levels: C2-3: No disc bulging or protrusion. Moderate right facet arthritis. C3-4 through C7-T1: Solid posterior fusion at each level. Posterior rods and screws in place with no evidence of loosening. No acute abnormalities. No change since the prior study. T1-2: No disc bulging or protrusion. Moderate right facet arthritis, chronic. Upper chest: Negative. Other: None IMPRESSION: 1. No acute intracranial abnormality. Diffuse atrophy, most severe in the temporal lobes. 2. No acute abnormality of the cervical spine. Chronic hypertrophy of the transverse ligament at C1-2, increased since the prior study but without significant compromise of the spinal canal. 3. Solid posterior fusion from C3 through T1. Electronically Signed   By: Lorriane Shire M.D.   On: 12/04/2019 18:01   CT Cervical Spine Wo Contrast  Result Date: 12/04/2019 CLINICAL DATA:  Head trauma secondary to a fall this morning. Headache. Left arm pain and left leg pain and left wrist swelling. EXAM: CT HEAD WITHOUT CONTRAST CT CERVICAL SPINE WITHOUT CONTRAST TECHNIQUE: Multidetector CT imaging of the head and cervical spine was performed following the standard protocol without intravenous contrast. Multiplanar CT image reconstructions of the cervical spine were also generated. COMPARISON:  CT scan of the head dated 11/28/2015 and CT scan of the cervical spine dated 12/10/2015 FINDINGS: CT HEAD FINDINGS Brain: No  evidence of acute infarction, hemorrhage, hydrocephalus, extra-axial collection or mass lesion/mass effect. Diffuse atrophy, most severe in the temporal lobes. Old tiny lacunar infarcts in the periventricular white matter and basal ganglia. Vascular: No hyperdense vessel or unexpected calcification. Skull: Normal. Negative for fracture or focal lesion. Sinuses/Orbits: Normal. Other: None CT CERVICAL SPINE FINDINGS Alignment: Chronic straightening and slight reversal of the cervical lordosis. Chronic 2 mm anterolisthesis of C3 on C4. Skull base and vertebrae: Patient has had posterior decompression from C3-4 through C7-T1. There is solid posterior fusion from C3 through T1. There is chronic hypertrophy of the transverse ligament at C1-2, increased since the prior study but without significant compromise of the spinal canal. Soft tissues and spinal canal: The paraspinal soft tissues are normal. No visible canal hematoma. Disc levels: C2-3: No disc bulging or protrusion. Moderate right facet arthritis. C3-4 through C7-T1: Solid posterior fusion at each level. Posterior rods and screws in place with no evidence of loosening. No acute abnormalities. No change since the prior study. T1-2: No disc bulging or protrusion. Moderate right facet arthritis, chronic. Upper chest: Negative. Other: None IMPRESSION: 1. No acute intracranial abnormality. Diffuse atrophy, most severe in the temporal lobes. 2. No acute abnormality of the cervical spine. Chronic hypertrophy of the transverse ligament at C1-2, increased since the prior study but without significant compromise of the spinal canal. 3. Solid posterior fusion from C3 through T1. Electronically Signed   By: Lorriane Shire M.D.   On: 12/04/2019 18:01   CT HIP LEFT WO CONTRAST  Result Date: 12/04/2019 CLINICAL  DATA:  Fall, left hip pain EXAM: CT OF THE LEFT HIP WITHOUT CONTRAST TECHNIQUE: Multidetector CT imaging of the left hip was performed according to the standard  protocol. Multiplanar CT image reconstructions were also generated. COMPARISON:  Same-day x-ray FINDINGS: Bones/Joint/Cartilage There is an acute fracture involving the tip of the left greater trochanter which is displaced proximally by 1.4 cm. Fracture is mildly comminuted. Gluteus medius tendon appears intact inserting on the avulsed fragment (series 5, images 19-22). The anterior aspect of the greater trochanter at the gluteus minimus insertion site is fractured without significant displacement. Fracture does not extend to the lesser trochanter, femoral neck, or femoral head. Left hip joint is intact without dislocation. There is a chronic appearing fracture deformity of the parasymphyseal aspect of the left pubic bone with irregular fragmentation. Fragmentations largely demonstrate corticated margins. There is overlying soft tissue thickening without hematoma. Ligaments Suboptimally assessed by CT. Muscles and Tendons Left gluteal tendon findings, as above. Remaining muscular and tendinous structures about the left hip are intact. Soft tissues No soft tissue hematoma. No acute findings within the visualized deep pelvis. IMPRESSION: 1. Acute fracture involving the tip of the left greater trochanter which is displaced proximally by 1.4 cm. 2. Chronic appearing fracture deformity of the parasymphyseal aspect of the left pubic bone with irregular fragmentation. Electronically Signed   By: Davina Poke M.D.   On: 12/04/2019 19:17   DG Hand Complete Left  Result Date: 12/04/2019 CLINICAL DATA:  Left wrist bruising and deformity secondary to a fall today. EXAM: LEFT HAND - COMPLETE 3+ VIEW COMPARISON:  Hand radiographs dated 01/24/2016 FINDINGS: There is an impacted slightly dorsally displaced and dorsally angulated fracture of the metaphysis of the distal left radius. Tiny avulsion of the ulnar styloid. Chronic severe degenerative changes in the wrist, stable. Diffuse arthritic changes in the IP joints.  Chronic Radiodense foreign body in the soft tissues of the volar aspect of the base of the left index finger. IMPRESSION: 1. Acute fracture of the metaphysis of the distal left radius. 2. Tiny avulsion of the ulnar styloid. 3. Radiodense foreign body in the soft tissues of the base of the left index finger. 4. Diffuse arthritic changes, stable. Electronically Signed   By: Lorriane Shire M.D.   On: 12/04/2019 18:08   DG Hip Unilat W or Wo Pelvis 2-3 Views Left  Result Date: 12/04/2019 CLINICAL DATA:  Left hip pain secondary to a fall today. EXAM: DG HIP (WITH OR WITHOUT PELVIS) 2-3V LEFT COMPARISON:  MRI dated 09/19/2016 FINDINGS: There is an avulsed distracted fracture of the tip of the left greater trochanter of the proximal left femur. There is no intertrochanteric extent of the fracture in the left femoral neck is intact. There is a deformity of the left pubic body which is new since the prior MRI. Is the patient tender over the symphysis pubis? This may be an old injury. Note is made of hardware in the right hip from previous open reduction internal fixation of proximal right femur fracture. IMPRESSION: 1. Acute avulsed fracture of the tip of the greater trochanter of the proximal left femur. 2. Deformity of the left pubic body which is new since the prior MRI. This may be an old injury. Electronically Signed   By: Lorriane Shire M.D.   On: 12/04/2019 18:05     Not all labs, radiology exams or other studies done during hospitalization come through on my EPIC note; however they are reviewed by me.    Assessment  and Plan  Fracture of left hip due to osteoporosis nonunion/total hemiarthroplasty/hypotension -patient underwent IM nail removal on 01/25/2020 and underwent total hip arthroplasty on 01/29/2020 (she had 2 separate hip surgeries); postop patient had hypotension requiring phenylephrine pressors requiring transfer to ICU service-resolved SNF-admitted for OT/PT; Eliquis as prophylaxis, see  below  Bilateral PE/spiculated mass left lower lobe-after returning from ICU back to regular floor patient became progressively more hypoxic and a CT angio revealed bilateral PEs as well as a spiculated mass left lower lobe which is known to family and patient.  They had decided not to pursue until hip scenario was worked out.  Patient was started on Eliquis 10 mg twice daily for 7 days then 5 mg twice daily for 3 months minimum.  Patient room air at time of discharge SNF-continue Eliquis 10 mg twice daily for 2 more days then 5 mg twice daily for minimum of 3 months  Hypertension SNF-controlled; continue Norvasc 2.5 mg daily  Hyperlipidemia SNF-not stated as uncontrolled; continue Lipitor 10 mg daily  Rheumatoid arthritis SNF-appears controlled; continue Arava 20 mg 1 tablet every Monday Wednesday Friday and Sunday and prednisone 5 mg per day 4 times a week Monday Wednesday Friday and Sunday.  Dementia SNF-chronic and stable continue Aricept 10 mg every morning and nightly and Namenda 28 mg 1 p.o. daily  GERD SNF-not stated as uncontrolled; continue Prilosec 20 mg daily  Depression SNF-appears controlled; continue dobutamine 300 mg daily, Remeron 30 mg daily and Zoloft 150 mg daily   Time spent greater than 45 minutes;> 50% of time with patient was spent reviewing records, labs, tests and studies, counseling and developing plan of care  Hennie Duos, MD

## 2020-02-11 ENCOUNTER — Encounter: Payer: Self-pay | Admitting: Internal Medicine

## 2020-02-11 DIAGNOSIS — I2699 Other pulmonary embolism without acute cor pulmonale: Secondary | ICD-10-CM | POA: Insufficient documentation

## 2020-02-11 DIAGNOSIS — K219 Gastro-esophageal reflux disease without esophagitis: Secondary | ICD-10-CM | POA: Insufficient documentation

## 2020-02-11 DIAGNOSIS — I10 Essential (primary) hypertension: Secondary | ICD-10-CM | POA: Insufficient documentation

## 2020-02-11 DIAGNOSIS — R918 Other nonspecific abnormal finding of lung field: Secondary | ICD-10-CM | POA: Insufficient documentation

## 2020-02-11 DIAGNOSIS — I959 Hypotension, unspecified: Secondary | ICD-10-CM | POA: Insufficient documentation

## 2020-02-14 LAB — CBC: RBC: 2.07 — AB (ref 3.87–5.11)

## 2020-02-14 LAB — BASIC METABOLIC PANEL
BUN: 11 (ref 4–21)
CO2: 24 — AB (ref 13–22)
Chloride: 106 (ref 99–108)
Creatinine: 0.9 (ref 0.5–1.1)
Glucose: 95
Potassium: 4.1 (ref 3.4–5.3)
Sodium: 141 (ref 137–147)

## 2020-02-14 LAB — CBC AND DIFFERENTIAL
HCT: 19 — AB (ref 36–46)
Hemoglobin: 6.3 — AB (ref 12.0–16.0)
Neutrophils Absolute: 6
Platelets: 505 — AB (ref 150–399)
WBC: 7.9

## 2020-02-14 LAB — COMPREHENSIVE METABOLIC PANEL
Calcium: 8.3 — AB (ref 8.7–10.7)
GFR calc Af Amer: 71.99
GFR calc non Af Amer: 62.12

## 2020-02-17 ENCOUNTER — Encounter: Payer: Self-pay | Admitting: Internal Medicine

## 2020-02-17 ENCOUNTER — Non-Acute Institutional Stay (SKILLED_NURSING_FACILITY): Payer: Medicare HMO | Admitting: Internal Medicine

## 2020-02-17 DIAGNOSIS — I10 Essential (primary) hypertension: Secondary | ICD-10-CM | POA: Diagnosis not present

## 2020-02-17 DIAGNOSIS — I2699 Other pulmonary embolism without acute cor pulmonale: Secondary | ICD-10-CM | POA: Diagnosis not present

## 2020-02-17 DIAGNOSIS — D500 Iron deficiency anemia secondary to blood loss (chronic): Secondary | ICD-10-CM | POA: Diagnosis not present

## 2020-02-17 DIAGNOSIS — M0579 Rheumatoid arthritis with rheumatoid factor of multiple sites without organ or systems involvement: Secondary | ICD-10-CM

## 2020-02-17 DIAGNOSIS — R918 Other nonspecific abnormal finding of lung field: Secondary | ICD-10-CM

## 2020-02-17 DIAGNOSIS — S72002S Fracture of unspecified part of neck of left femur, sequela: Secondary | ICD-10-CM

## 2020-02-17 LAB — CBC AND DIFFERENTIAL
HCT: 25 — AB (ref 36–46)
Hemoglobin: 8.2 — AB (ref 12.0–16.0)
Neutrophils Absolute: 6
Platelets: 384 (ref 150–399)
WBC: 7.4

## 2020-02-17 LAB — COMPREHENSIVE METABOLIC PANEL: Calcium: 8 — AB (ref 8.7–10.7)

## 2020-02-17 LAB — BASIC METABOLIC PANEL: Glucose: 63

## 2020-02-17 LAB — CBC: RBC: 2.63 — AB (ref 3.87–5.11)

## 2020-02-17 MED ORDER — SENNOSIDES-DOCUSATE SODIUM 8.6-50 MG PO TABS
1.00 | ORAL_TABLET | ORAL | Status: DC
Start: 2020-02-16 — End: 2020-02-17

## 2020-02-17 MED ORDER — AMLODIPINE BESYLATE 2.5 MG PO TABS
2.50 | ORAL_TABLET | ORAL | Status: DC
Start: 2020-02-17 — End: 2020-02-17

## 2020-02-17 MED ORDER — DONEPEZIL HCL 10 MG PO TABS
10.00 | ORAL_TABLET | ORAL | Status: DC
Start: 2020-02-16 — End: 2020-02-17

## 2020-02-17 MED ORDER — SODIUM CHLORIDE 0.9 % IV SOLN
INTRAVENOUS | Status: DC
Start: ? — End: 2020-02-17

## 2020-02-17 MED ORDER — BUPROPION HCL ER (XL) 300 MG PO TB24
300.00 | ORAL_TABLET | ORAL | Status: DC
Start: 2020-02-16 — End: 2020-02-17

## 2020-02-17 MED ORDER — PREDNISONE 5 MG PO TABS
5.00 | ORAL_TABLET | ORAL | Status: DC
Start: 2020-02-17 — End: 2020-02-17

## 2020-02-17 MED ORDER — ALPRAZOLAM 0.25 MG PO TABS
0.25 | ORAL_TABLET | ORAL | Status: DC
Start: ? — End: 2020-02-17

## 2020-02-17 MED ORDER — ATORVASTATIN CALCIUM 10 MG PO TABS
10.00 | ORAL_TABLET | ORAL | Status: DC
Start: 2020-02-17 — End: 2020-02-17

## 2020-02-17 MED ORDER — CALCIUM CARBONATE 1250 (500 CA) MG PO TABS
1250.00 | ORAL_TABLET | ORAL | Status: DC
Start: 2020-02-17 — End: 2020-02-17

## 2020-02-17 MED ORDER — CYANOCOBALAMIN 1000 MCG PO TABS
1000.00 | ORAL_TABLET | ORAL | Status: DC
Start: 2020-02-17 — End: 2020-02-17

## 2020-02-17 MED ORDER — PANTOPRAZOLE SODIUM 40 MG PO TBEC
40.00 | DELAYED_RELEASE_TABLET | ORAL | Status: DC
Start: 2020-02-17 — End: 2020-02-17

## 2020-02-17 MED ORDER — SERTRALINE HCL 100 MG PO TABS
100.00 | ORAL_TABLET | ORAL | Status: DC
Start: 2020-02-17 — End: 2020-02-17

## 2020-02-17 MED ORDER — GENERIC EXTERNAL MEDICATION
8.00 | Status: DC
Start: ? — End: 2020-02-17

## 2020-02-17 MED ORDER — FERROUS SULFATE 325 (65 FE) MG PO TABS
325.00 | ORAL_TABLET | ORAL | Status: DC
Start: 2020-02-16 — End: 2020-02-17

## 2020-02-17 MED ORDER — ACETAMINOPHEN 325 MG PO TABS
650.00 | ORAL_TABLET | ORAL | Status: DC
Start: ? — End: 2020-02-17

## 2020-02-17 MED ORDER — SULFAMETHOXAZOLE-TRIMETHOPRIM 800-160 MG PO TABS
1.00 | ORAL_TABLET | ORAL | Status: DC
Start: 2020-02-18 — End: 2020-02-17

## 2020-02-17 MED ORDER — LEFLUNOMIDE 20 MG PO TABS
20.00 | ORAL_TABLET | ORAL | Status: DC
Start: 2020-02-17 — End: 2020-02-17

## 2020-02-17 NOTE — Progress Notes (Signed)
Location:    [facility] Room Number: 235/T Place of Service:  SNF [phone]) Provider:  Leda Min, MD  Patient Care Team: Hennie Duos, MD as PCP - General (Internal Medicine)  Extended Emergency Contact Information Primary Emergency Contact: Karle Plumber Address: [address] [place], [place] [address] [place] Phone: [phone] Relation: Friend Secondary Emergency Contact: Tobie Poet Mobile Phone: [phone] Relation: Other  Code Status:  DNR Goals of care: Advanced Directive information Advanced Directives 02/17/2020  Does Patient Have a Medical Advance Directive? Yes  Type of Paramedic of [place];Out of facility DNR (pink MOST or yellow form);Living will  Does patient want to make changes to medical advance directive? No - Patient declined  Copy of [place] in Chart? Yes - validated most recent copy scanned in chart (See row information)  Pre-existing out of facility DNR order (yellow form or pink MOST form) Yellow form placed in chart (order not valid for inpatient use)     Chief Complaint  Patient presents with  . Hospitalization Follow-up    Hospitalization Follow Up  Status post hospitalization for anemia with acute blood loss-status post transfusion  HPI:  Pt is a 86 y.o. female seen today for a hospital f/u after treatment for anemia with acute blood loss. Patient was initially admitted here after hospitalization for fracture of the left hip because of osteoporosis and nonunion-she went an IM nailing with a total hip arthroplasty she had 2 separate hip surgeries.  Surgery was complicated with postop bilateral PEs.  She also had a spiculated mass of the left lower lobe which family was aware of previously. Follow-up was not pursued until patient had recovered from her current issues.  She was started on Eliquis.  And she  was here for PT and OT.  However her lab apparently showed a low hemoglobin of 6.1 and she was sent to the ER.  She received a transfusion-Hemoccult stool was positive and Eliquis was held   She was cauterized with angiodysplastic areas.  And Eliquis was restarted as well as her PPI.  She also is on iron twice a day.  Hemoglobin on February 23 was 7.5.  In regard to her other medical issues these appear to be stabilized she does have a history of dementia continues on Aricept and Namenda.  She is on Norvasc for hypertension as well as Arava and prednisone for rheumatoid arthritis.  Currently she is sitting in her chair comfortably has no complaints she said she would like to go back to bed        Past Medical History:  Diagnosis Date  . Arthritis   . Cough    loose  started 05/05/15 no fever  . Dementia ([place])    STARTING SIGNS OF DEMENTIA  . Depression    TAKING ZOLOFT FOR A LONG TIME  . Headache(784.0)   . Hypertension   . Rheumatoid arthritis [facility])    Past Surgical History:  Procedure Laterality Date  . CARPAL TUNNEL RELEASE     RIGHT  . EYE SURGERY     BILATERAL CATARACTS  . FRACTURE SURGERY     RIGHT HIP-HAS SCREW  . POSTERIOR CERVICAL FUSION/FORAMINOTOMY N/A 09/20/2013   Procedure: CERVICAL THREE TO CERVICAL SEVEN  POSTERIOR CERVICAL FUSION;  Surgeon: Elaina Hoops, MD;  Location: [facility]UR[facility] ORS;  Service: Neurosurgery;  Laterality: N/A;  CERVICAL THREE TO CERVICAL SEVEN  POSTERIOR CERVICAL FUSION  . POSTERIOR CERVICAL FUSION/FORAMINOTOMY N/A 05/10/2015   Procedure: Posterior Cervical Laminectomy Diskectomy and Fusion Cervical seven Thoracic one. Exploration of Fusion Removal of Hardware Cervical three to seven;  Surgeon: Kary Kos, MD;  Location: [facility] OR[facility];  Service: Neurosurgery;  Laterality: N/A;  . ROTATOR CUFF REPAIR     RIGHT SHOULDER  . SHOULDER ARTHROSCOPY     ? LEFT BONE SPURSS    Allergies  Allergen Reactions  . Methotrexate Other (See Comments)     Severe pancytopenia  Severe pancytopenia    . Cymbalta [Duloxetine Hcl] Rash  . Duloxetine Rash    Rash with Itching 08/20/10    Allergies as of 02/17/2020      Reactions   Methotrexate Other (See Comments)   Severe pancytopenia  Severe pancytopenia    Cymbalta [duloxetine Hcl] Rash   Duloxetine Rash   Rash with Itching 08/20/10      Medication List       Accurate as of February 17, 2020 10:12 AM. If you have any questions, ask your nurse or doctor.        acetaminophen 325 MG tablet Commonly known as: TYLENOL Take 650 mg by mouth every 6 (six) hours as needed (For Pain).   albuterol 108 (90 Base) MCG/ACT inhaler Commonly known as: VENTOLIN HFA Inhale 2 puffs into the lungs every 6 (six) hours as needed for wheezing or shortness of breath.   amLODipine 5 MG tablet Commonly known as: NORVASC Take 0.5 tablets (2.5 mg total) by mouth daily.   ascorbic acid 500 MG tablet Commonly known as: VITAMIN C Take 1 tablet (500 mg total) by mouth daily.   atorvastatin 10 MG tablet Commonly known as: LIPITOR Take 1 tablet (10 mg total) by mouth at bedtime. FOR HYPERCHOLESTEROLEMIA   buPROPion 300 MG 24 hr tablet Commonly known as: WELLBUTRIN XL Take 1 tablet (300 mg total) by mouth at bedtime.   calcium carbonate 600 MG Tabs tablet Commonly known as: OS-CAL Take 600 mg by mouth daily with breakfast.   donepezil 10 MG tablet Commonly known as: ARICEPT Take 1 tablet (10 mg total) by mouth 2 (two) times daily.   Eliquis 5 MG Tabs tablet Generic drug: apixaban Take 5 mg by mouth 2 (two) times daily. What changed: Another medication with the same name was removed. Continue taking this medication, and follow the directions you see here. Changed by: Granville Lewis, PA-C   Ensure Take 237 mLs by mouth 2 (two) times daily. Lactose reduced   ferrous sulfate 325 (65 FE) MG tablet Take 1 tablet (325 mg total) by mouth 2 (two) times daily with a meal. For Anemia    HYDROcodone-acetaminophen 5-325 MG tablet Commonly known as: NORCO/VICODIN Take 1 tablet by mouth 2 (two) times daily.   leflunomide 20 MG tablet Commonly known as: ARAVA Take 1 tablet (20 mg total) by mouth daily. Take on M-W-F-Su   memantine 28 MG Cp24 24 hr capsule Commonly known as: NAMENDA XR Take 1 capsule (28 mg total) by mouth daily.   mirtazapine 30 MG tablet Commonly known as: REMERON Take 1 tablet (30 mg total) by mouth every evening.   multivitamin with minerals Tabs tablet Take 1 tablet by mouth daily.   omeprazole 20 MG capsule Commonly known as: PRILOSEC Take 1 capsule (20 mg total) by mouth daily.   potassium chloride SA 20 MEQ tablet Commonly known as: KLOR-CON Take 1 tablet (20 mEq total) by  mouth daily.   predniSONE 5 MG tablet Commonly known as: DELTASONE Take 5 mg by mouth 4 (four) times daily. Take on M-W-F-Sun   senna-docusate 8.6-50 MG tablet Commonly known as: Senokot-S Take 1 tablet by mouth every evening.   sertraline 100 MG tablet Commonly known as: ZOLOFT Take 1.5 tablets (150 mg total) by mouth daily.   sulfamethoxazole-trimethoprim 800-160 MG tablet Commonly known as: Bactrim DS TAKE 1 TABLET BY MOUTH EVERY MONDAY - WEDNESDAY - FRIDAY FOR PCP PROPHYLAXIS DUE TO CHRONIC STEROID USE   vitamin B-12 1000 MCG tablet Commonly known as: CYANOCOBALAMIN Take 1 tablet (1,000 mcg total) by mouth daily.       Review of Systems   In general she is not complaining of any fever or chills.  Skin does not complain of rashes or itching.  Head ears eyes nose mouth and throat is not complaining of visual changes or having a sore throat.  Respiratory does not complain of shortness of breath or cough.  Cardiac no complaints of chest pain or increasing edema.  GI does not complain of abdominal discomfort nausea vomiting diarrhea constipation or rectal bleeding.  GU is not complaining of dysuria.  Musculoskeletal recent history of left hip  repair she does not complain of pain at this point.  Neurologic does not complain of dizziness headache numbness or increased weakness from baseline.  And psych does not complain of being depressed or anxious she does continue on Wellbutrin she is also on Namenda and Aricept with history of dementia which appears to be mild/moderate.    Immunization History  Administered Date(s) Administered  . Influenza, High Dose Seasonal PF 09/26/2015, 09/29/2018, 09/26/2019  . Influenza,inj,Quad PF,6+ Mos 09/21/2013  . Moderna SARS-COVID-2 Vaccination 12/23/2019  . Pneumococcal Conjugate-13 12/09/2016  . Pneumococcal Polysaccharide-23 12/23/1996, 02/28/2005, 09/22/2013  . Td 12/23/1997  . Tdap 09/30/2009, 07/09/2019  . Zoster 01/02/2012   Pertinent  Health Maintenance Due  Topic Date Due  . DEXA SCAN  11/03/1998  . INFLUENZA VACCINE  Completed  . PNA vac Low Risk Adult  Completed   No flowsheet data found. Functional Status Survey:    Vitals:   02/17/20 0951  BP: 138/77  Pulse: 91  Resp: 17  Temp: 98.1 F (36.7 C)  TempSrc: Oral  SpO2: 95%  Weight: 98 lb 14.4 oz (44.9 kg)  Height: 5\' 4"  (1.626 m)   Body mass index is 16.98 kg/m. Physical Exam   In general this is a pleasant elderly female no distress sitting comfortably in her chair.  Her skin is warm and dry she does have covering over her surgical site left hip with hardware in place.  Eyes visual acuity appears to be intact sclera and conjunctive are clear.  Oropharynx is clear mucous membranes moist.  Chest is clear to auscultation with somewhat shallow air entry there is no labored breathing.  Heart is regular rate and rhythm without murmur gallop or rub she does not have significant lower extremity edema pedal pulses are intact bilaterally.  Abdomen is soft nontender with positive bowel sounds.  Musculoskeletal as noted above she does have wrapping over her left hip surgical site and hardware is in place otherwise  appears able to move her other extremities at baseline.   Neurologic appears grossly intact her speech is clear touch sensation appears to be intact-cranial nerves intact.  Psych she is pleasant and appropriate her dementia appears to be mild.  Labs reviewed:  February 15, 2020.  WBC 8.7 hemoglobin 7.5 platelets 402.  February 14, 2020.  WBC 7.9 hemoglobin 6.3 platelets 505.  Sodium 141 potassium 4.1 BUN 11.2 creatinine 0.85.   Recent Labs    12/04/19 1620 12/04/19 1620 12/21/19 0000 01/06/20 0000 02/02/20 0000  NA 141  --  144 138 140  K 4.2   < > 4.4 4.6 3.5  CL 107   < > 107 102 109*  CO2 25   < > 24* 24* 25*  GLUCOSE 125*  --   --   --   --   BUN 20  --  17 14 15   CREATININE 1.67*  --  1.2* 1.0 0.8  CALCIUM 8.9   < > 9.5 9.6 7.7*   < > = values in this interval not displayed.   Recent Labs    12/11/19 0000  AST 33  ALT 17  ALKPHOS 111  ALBUMIN 2.7*   Recent Labs    12/04/19 1620 12/21/19 0000 02/02/20 0000  WBC 10.9* 10.9 13.2  NEUTROABS 9.4* 9  --   HGB 10.2* 10.0* 8.8*  HCT 33.3* 31* 27*  MCV 90.0  --   --   PLT 328 454* 401*   No results found for: TSH No results found for: HGBA1C No results found for: CHOL, HDL, LDLCALC, LDLDIRECT, TRIG, CHOLHDL  Significant Diagnostic Results in last 30 days:  DG Forearm Left  Result Date: 12/04/2019 CLINICAL DATA:  Left forearm pain and deformity and bruising secondary to a fall at home today. EXAM: LEFT FOREARM - 2 VIEW COMPARISON:  None. FINDINGS: There is an impacted fracture of the metaphysis of the distal left radius. Tiny avulsion of the ulnar styloid. Chronic arthritic changes in the wrist. Proximal radius and ulna appear normal. No elbow joint effusion. No dislocations. IMPRESSION: 1. Impacted fracture of the metaphysis of the distal left radius. 2. Tiny avulsion of the ulnar styloid. Electronically Signed   By: Lorriane Shire M.D.   On: 12/04/2019 18:09   CT Head  Wo Contrast  Result Date: 12/04/2019 CLINICAL DATA:  Head trauma secondary to a fall this morning. Headache. Left arm pain and left leg pain and left wrist swelling. EXAM: CT HEAD WITHOUT CONTRAST CT CERVICAL SPINE WITHOUT CONTRAST TECHNIQUE: Multidetector CT imaging of the head and cervical spine was performed following the standard protocol without intravenous contrast. Multiplanar CT image reconstructions of the cervical spine were also generated. COMPARISON:  CT scan of the head dated 11/28/2015 and CT scan of the cervical spine dated 12/10/2015 FINDINGS: CT HEAD FINDINGS Brain: No evidence of acute infarction, hemorrhage, hydrocephalus, extra-axial collection or mass lesion/mass effect. Diffuse atrophy, most severe in the temporal lobes. Old tiny lacunar infarcts in the periventricular white matter and basal ganglia. Vascular: No hyperdense vessel or unexpected calcification. Skull: Normal. Negative for fracture or focal lesion. Sinuses/Orbits: Normal. Other: None CT CERVICAL SPINE FINDINGS Alignment: Chronic straightening and slight reversal of the cervical lordosis. Chronic 2 mm anterolisthesis of C3 on C4. Skull base and vertebrae: Patient has had posterior decompression from C3-4 through C7-T1. There is solid posterior fusion from C3 through T1. There is chronic hypertrophy of the transverse ligament at C1-2, increased since the prior study but without significant compromise of the spinal canal. Soft tissues and spinal canal: The paraspinal soft tissues are normal. No visible canal hematoma. Disc levels: C2-3: No disc bulging or protrusion. Moderate right facet arthritis. C3-4 through C7-T1: Solid posterior fusion at each level.  Posterior rods and screws in place with no evidence of loosening. No acute abnormalities. No change since the prior study. T1-2: No disc bulging or protrusion. Moderate right facet arthritis, chronic. Upper chest: Negative. Other: None IMPRESSION: 1. No acute intracranial  abnormality. Diffuse atrophy, most severe in the temporal lobes. 2. No acute abnormality of the cervical spine. Chronic hypertrophy of the transverse ligament at C1-2, increased since the prior study but without significant compromise of the spinal canal. 3. Solid posterior fusion from C3 through T1. Electronically Signed   By: Lorriane Shire M.D.   On: 12/04/2019 18:01   CT Cervical Spine Wo Contrast  Result Date: 12/04/2019 CLINICAL DATA:  Head trauma secondary to a fall this morning. Headache. Left arm pain and left leg pain and left wrist swelling. EXAM: CT HEAD WITHOUT CONTRAST CT CERVICAL SPINE WITHOUT CONTRAST TECHNIQUE: Multidetector CT imaging of the head and cervical spine was performed following the standard protocol without intravenous contrast. Multiplanar CT image reconstructions of the cervical spine were also generated. COMPARISON:  CT scan of the head dated 11/28/2015 and CT scan of the cervical spine dated 12/10/2015 FINDINGS: CT HEAD FINDINGS Brain: No evidence of acute infarction, hemorrhage, hydrocephalus, extra-axial collection or mass lesion/mass effect. Diffuse atrophy, most severe in the temporal lobes. Old tiny lacunar infarcts in the periventricular white matter and basal ganglia. Vascular: No hyperdense vessel or unexpected calcification. Skull: Normal. Negative for fracture or focal lesion. Sinuses/Orbits: Normal. Other: None CT CERVICAL SPINE FINDINGS Alignment: Chronic straightening and slight reversal of the cervical lordosis. Chronic 2 mm anterolisthesis of C3 on C4. Skull base and vertebrae: Patient has had posterior decompression from C3-4 through C7-T1. There is solid posterior fusion from C3 through T1. There is chronic hypertrophy of the transverse ligament at C1-2, increased since the prior study but without significant compromise of the spinal canal. Soft tissues and spinal canal: The paraspinal soft tissues are normal. No visible canal hematoma. Disc levels: C2-3: No  disc bulging or protrusion. Moderate right facet arthritis. C3-4 through C7-T1: Solid posterior fusion at each level. Posterior rods and screws in place with no evidence of loosening. No acute abnormalities. No change since the prior study. T1-2: No disc bulging or protrusion. Moderate right facet arthritis, chronic. Upper chest: Negative. Other: None IMPRESSION: 1. No acute intracranial abnormality. Diffuse atrophy, most severe in the temporal lobes. 2. No acute abnormality of the cervical spine. Chronic hypertrophy of the transverse ligament at C1-2, increased since the prior study but without significant compromise of the spinal canal. 3. Solid posterior fusion from C3 through T1. Electronically Signed   By: Lorriane Shire M.D.   On: 12/04/2019 18:01   CT HIP LEFT WO CONTRAST  Result Date: 12/04/2019 CLINICAL DATA:  Fall, left hip pain EXAM: CT OF THE LEFT HIP WITHOUT CONTRAST TECHNIQUE: Multidetector CT imaging of the left hip was performed according to the standard protocol. Multiplanar CT image reconstructions were also generated. COMPARISON:  Same-day x-ray FINDINGS: Bones/Joint/Cartilage There is an acute fracture involving the tip of the left greater trochanter which is displaced proximally by 1.4 cm. Fracture is mildly comminuted. Gluteus medius tendon appears intact inserting on the avulsed fragment (series 5, images 19-22). The anterior aspect of the greater trochanter at the gluteus minimus insertion site is fractured without significant displacement. Fracture does not extend to the lesser trochanter, femoral neck, or femoral head. Left hip joint is intact without dislocation. There is a chronic appearing fracture deformity of the parasymphyseal aspect of the left  pubic bone with irregular fragmentation. Fragmentations largely demonstrate corticated margins. There is overlying soft tissue thickening without hematoma. Ligaments Suboptimally assessed by CT. Muscles and Tendons Left gluteal tendon  findings, as above. Remaining muscular and tendinous structures about the left hip are intact. Soft tissues No soft tissue hematoma. No acute findings within the visualized deep pelvis. IMPRESSION: 1. Acute fracture involving the tip of the left greater trochanter which is displaced proximally by 1.4 cm. 2. Chronic appearing fracture deformity of the parasymphyseal aspect of the left pubic bone with irregular fragmentation. Electronically Signed   By: Davina Poke M.D.   On: 12/04/2019 19:17   DG Hand Complete Left  Result Date: 12/04/2019 CLINICAL DATA:  Left wrist bruising and deformity secondary to a fall today. EXAM: LEFT HAND - COMPLETE 3+ VIEW COMPARISON:  Hand radiographs dated 01/24/2016 FINDINGS: There is an impacted slightly dorsally displaced and dorsally angulated fracture of the metaphysis of the distal left radius. Tiny avulsion of the ulnar styloid. Chronic severe degenerative changes in the wrist, stable. Diffuse arthritic changes in the IP joints. Chronic Radiodense foreign body in the soft tissues of the volar aspect of the base of the left index finger. IMPRESSION: 1. Acute fracture of the metaphysis of the distal left radius. 2. Tiny avulsion of the ulnar styloid. 3. Radiodense foreign body in the soft tissues of the base of the left index finger. 4. Diffuse arthritic changes, stable. Electronically Signed   By: Lorriane Shire M.D.   On: 12/04/2019 18:08   DG Hip Unilat W or Wo Pelvis 2-3 Views Left  Result Date: 12/04/2019 CLINICAL DATA:  Left hip pain secondary to a fall today. EXAM: DG HIP (WITH OR WITHOUT PELVIS) 2-3V LEFT COMPARISON:  MRI dated 09/19/2016 FINDINGS: There is an avulsed distracted fracture of the tip of the left greater trochanter of the proximal left femur. There is no intertrochanteric extent of the fracture in the left femoral neck is intact. There is a deformity of the left pubic body which is new since the prior MRI. Is the patient tender over the symphysis  pubis? This may be an old injury. Note is made of hardware in the right hip from previous open reduction internal fixation of proximal right femur fracture. IMPRESSION: 1. Acute avulsed fracture of the tip of the greater trochanter of the proximal left femur. 2. Deformity of the left pubic body which is new since the prior MRI. This may be an old injury. Electronically Signed   By: Lorriane Shire M.D.   On: 12/04/2019 18:05    Assessment/Plan T #1 history of acute blood loss anemia from GI loss-as noted above she did have gastric angiodysplastic areas cauterized-.  She did receive 1 unit of packed red blood cells and hemoglobin did rise up to 7.5-updated CBC is pending.  Eliquis initially was held but has since been restarted.  She continues on Prilosec as well as iron 325 mg twice daily  2.  History of DVT again Eliquis has been restarted at 5 mg twice daily at this point will monitor hemoglobin.  3.  History of left hip fracture status post repair x2-at this point continue PT and OT she is not currently complaining of pain she does have orders for Norco twice a day.  4.  History of hypertension she continues on Norvasc 2.5 mg a day at this point appears stable recent blood pressures 138/77-116/73.  5.  History of rheumatoid arthritis she is on Arava 20 mg Monday Wednesday and  Friday as well as prednisone 4 mg 4 times a week.  6.  History of suspicious lung nodule-monitor for now per patient wishes apparently further work-up was deferred until patient orthopedic issues are improved.  7.-History of anxiety she continues on Xanax 0.25 mg every 6 hours as needed this appears stable at this point.  8.  History of depression she continues on Wellbutrin 300 mg nightly as well as Zoloft 150 mg a day and Remeron 30 mg a day at this point appears stable.  9.  History of COPD she is on albuterol inhalers as needed this appears stable currently.  10.  History of dementia she is on Aricept 10 mg a  day as well as Namenda 28 mg-this appears stable I would classify her dementia is mild-moderate.  She appears to do quite well with supportive care.  11.  History of elevated troponin in the hospital that was thought likely demand ischemia from her anemia she does not complain of any chest pain or increased shortness of breath.  12.  History of hypokalemia she is on potassium 20 mEq a day update BMP is pending.  13.  History of hyperlipidemia not stated as uncontrolled she is on atorvastatin 10 mg a day.  14.  History of UTI-she is on prophylaxis with Bactrim.  [id number] note greater than 40 minutes spent assessing patient-reviewing her chart and labs-coordinating and formulating a plan of care for numerous diagnoses-of note greater than 50% of time spent coordinating a plan of care with input as noted above

## 2020-02-18 ENCOUNTER — Non-Acute Institutional Stay (SKILLED_NURSING_FACILITY): Payer: Medicare HMO | Admitting: Internal Medicine

## 2020-02-18 ENCOUNTER — Encounter: Payer: Self-pay | Admitting: Internal Medicine

## 2020-02-18 DIAGNOSIS — M0579 Rheumatoid arthritis with rheumatoid factor of multiple sites without organ or systems involvement: Secondary | ICD-10-CM

## 2020-02-18 DIAGNOSIS — F32A Depression, unspecified: Secondary | ICD-10-CM

## 2020-02-18 DIAGNOSIS — D6489 Other specified anemias: Secondary | ICD-10-CM

## 2020-02-18 DIAGNOSIS — F03C0 Unspecified dementia, severe, without behavioral disturbance, psychotic disturbance, mood disturbance, and anxiety: Secondary | ICD-10-CM

## 2020-02-18 DIAGNOSIS — I2699 Other pulmonary embolism without acute cor pulmonale: Secondary | ICD-10-CM | POA: Diagnosis not present

## 2020-02-18 DIAGNOSIS — K31819 Angiodysplasia of stomach and duodenum without bleeding: Secondary | ICD-10-CM | POA: Diagnosis not present

## 2020-02-18 DIAGNOSIS — F329 Major depressive disorder, single episode, unspecified: Secondary | ICD-10-CM

## 2020-02-18 DIAGNOSIS — J9601 Acute respiratory failure with hypoxia: Secondary | ICD-10-CM

## 2020-02-18 DIAGNOSIS — F0390 Unspecified dementia without behavioral disturbance: Secondary | ICD-10-CM

## 2020-02-18 DIAGNOSIS — E785 Hyperlipidemia, unspecified: Secondary | ICD-10-CM

## 2020-02-18 NOTE — Progress Notes (Signed)
: Provider:  Hennie Duos., MD Location:  [place] Room Number: 829-F Place of Service:  SNF ([phone])  PCP: Hennie Duos, MD Patient Care Team: Hennie Duos, MD as PCP - General (Internal Medicine)  Extended Emergency Contact Information Primary Emergency Contact: Karle Plumber Address: [address] [place], [place] [address] [place] Phone: [phone] Relation: Friend Secondary Emergency Contact: Tobie Poet Mobile Phone: [phone] Relation: Other     Allergies: Methotrexate, Cymbalta [duloxetine hcl], and Duloxetine  Chief Complaint  Patient presents with  . Readmit To SNF    Readmission to [facility] SNF     HPI: Patient is an 86 y.o. female with anemia, dementia, essential tremor, CKD stage III, COPD, depression, and recent bilateral pulmonary embolism who was discharged to SNF on Eliquis.  Patient had developed worsening confusion since discharge and routine labs today showed a low hemoglobin..  In the ED hemoglobin level was 6.1, and stool occult was positive patient was admitted to [facility] on 12/22-24 where she was transfused 1 unit PRBC with a follow-up hemoglobin of 7.5.  GI did an EGD on the patient on 2/23 and found her to have gastric angiodysplasias she cauterized.  The remainder of the gastric and duodenal mucosa were normal.  He also recommended starting proton pump inhibitor and stated it was okay to resume Eliquis.  Patient was offered to have a suspicious nodule on CT of the chest in the right lung she elected to monitor this at this time with the help of her decision maker Lisabeth Register.  Patient is admitted back to SNF for OT/PT.  While at skilled nursing facility patient will be followed for rheumatoid arthritis treated with Arava and prednisone, dementia treated with Aricept and Namenda, and depression treated with Wellbutrin Zoloft and Remeron.  Past Medical  History:  Diagnosis Date  . Arthritis   . Cough    loose  started 05/05/15 no fever  . Dementia ([place])    STARTING SIGNS OF DEMENTIA  . Depression    TAKING ZOLOFT FOR A LONG TIME  . Headache(784.0)   . Hypertension   . Rheumatoid arthritis [facility])     Past Surgical History:  Procedure Laterality Date  . CARPAL TUNNEL RELEASE     RIGHT  . EYE SURGERY     BILATERAL CATARACTS  . FRACTURE SURGERY     RIGHT HIP-HAS SCREW  . POSTERIOR CERVICAL FUSION/FORAMINOTOMY N/A 09/20/2013   Procedure: CERVICAL THREE TO CERVICAL SEVEN  POSTERIOR CERVICAL FUSION;  Surgeon: Elaina Hoops, MD;  Location: [place] NEURO ORS;  Service: Neurosurgery;  Laterality: N/A;  CERVICAL THREE TO CERVICAL SEVEN  POSTERIOR CERVICAL FUSION  . POSTERIOR CERVICAL FUSION/FORAMINOTOMY N/A 05/10/2015   Procedure: Posterior Cervical Laminectomy Diskectomy and Fusion Cervical seven Thoracic one. Exploration of Fusion Removal of Hardware Cervical three to seven;  Surgeon: Kary Kos, MD;  Location: [place] NEURO ORS;  Service: Neurosurgery;  Laterality: N/A;  . ROTATOR CUFF REPAIR     RIGHT SHOULDER  . SHOULDER ARTHROSCOPY     ? LEFT BONE SPURSS    Allergies as of 02/18/2020      Reactions   Methotrexate Other (See Comments)   Severe pancytopenia  Severe pancytopenia    Cymbalta [duloxetine Hcl] Rash   Duloxetine Rash   Rash with Itching 08/20/10      Medication List  Accurate as of February 18, 2020  8:58 PM. If you have any questions, ask your nurse or doctor.        acetaminophen 325 MG tablet Commonly known as: TYLENOL Take 650 mg by mouth every 6 (six) hours as needed (For Pain).   albuterol 108 (90 Base) MCG/ACT inhaler Commonly known as: VENTOLIN HFA Inhale 2 puffs into the lungs every 6 (six) hours as needed for wheezing or shortness of breath.   ALPRAZolam 0.25 MG tablet Commonly known as: XANAX Take 0.25 mg by mouth every 6 (six) hours as needed for anxiety.   amLODipine 5 MG tablet Commonly known as:  NORVASC Take 0.5 tablets (2.5 mg total) by mouth daily.   ascorbic acid 500 MG tablet Commonly known as: VITAMIN C Take 1 tablet (500 mg total) by mouth daily.   atorvastatin 10 MG tablet Commonly known as: LIPITOR Take 1 tablet (10 mg total) by mouth at bedtime. FOR HYPERCHOLESTEROLEMIA   buPROPion 300 MG 24 hr tablet Commonly known as: WELLBUTRIN XL Take 1 tablet (300 mg total) by mouth at bedtime.   calcium carbonate 600 MG Tabs tablet Commonly known as: OS-CAL Take 600 mg by mouth daily with breakfast.   donepezil 10 MG tablet Commonly known as: ARICEPT Take 1 tablet (10 mg total) by mouth 2 (two) times daily.   Eliquis 5 MG Tabs tablet Generic drug: apixaban Take 5 mg by mouth 2 (two) times daily.   Ensure Take 237 mLs by mouth 2 (two) times daily. Lactose reduced   NUTRITIONAL SUPPLEMENT PO Take 1 each by mouth in the morning and at bedtime. L Magic Cup with lunch and dinner   ferrous sulfate 325 (65 FE) MG tablet Take 1 tablet (325 mg total) by mouth 2 (two) times daily with a meal. For Anemia   HYDROcodone-acetaminophen 5-325 MG tablet Commonly known as: NORCO/VICODIN Take 1 tablet by mouth 2 (two) times daily.   leflunomide 20 MG tablet Commonly known as: ARAVA Take 1 tablet (20 mg total) by mouth daily. Take on M-W-F-Su   memantine 28 MG Cp24 24 hr capsule Commonly known as: NAMENDA XR Take 1 capsule (28 mg total) by mouth daily.   mirtazapine 30 MG tablet Commonly known as: REMERON Take 1 tablet (30 mg total) by mouth every evening.   multivitamin with minerals Tabs tablet Take 1 tablet by mouth daily.   omeprazole 20 MG capsule Commonly known as: PRILOSEC Take 1 capsule (20 mg total) by mouth daily.   potassium chloride SA 20 MEQ tablet Commonly known as: KLOR-CON Take 1 tablet (20 mEq total) by mouth daily.   predniSONE 5 MG tablet Commonly known as: DELTASONE Take 5 mg by mouth 4 (four) times daily. Take on M-W-F-Sun   senna-docusate  8.6-50 MG tablet Commonly known as: Senokot-S Take 1 tablet by mouth every evening.   sertraline 100 MG tablet Commonly known as: ZOLOFT Take 1.5 tablets (150 mg total) by mouth daily.   sulfamethoxazole-trimethoprim 800-160 MG tablet Commonly known as: Bactrim DS TAKE 1 TABLET BY MOUTH EVERY MONDAY - WEDNESDAY - FRIDAY FOR PCP PROPHYLAXIS DUE TO CHRONIC STEROID USE   vitamin B-12 1000 MCG tablet Commonly known as: CYANOCOBALAMIN Take 1 tablet (1,000 mcg total) by mouth daily.       No orders of the defined types were placed in this encounter.   Immunization History  Administered Date(s) Administered  . Influenza, High Dose Seasonal PF 09/26/2015, 09/29/2018, 09/26/2019  . Influenza,inj,Quad PF,6+ Mos 09/21/2013  .  Moderna SARS-COVID-2 Vaccination 12/23/2019  . Pneumococcal Conjugate-13 12/09/2016  . Pneumococcal Polysaccharide-23 12/23/1996, 02/28/2005, 09/22/2013  . Td 12/23/1997  . Tdap 09/30/2009, 07/09/2019  . Zoster 01/02/2012    Social History   Tobacco Use  . Smoking status: Current Every Day Smoker    Packs/day: 0.25    Years: 68.00    Pack years: 17.00    Types: Cigarettes  . Smokeless tobacco: Never Used  Substance Use Topics  . Alcohol use: No    Family history is  Medical History Relation Name Comments  Cancer Brother    Stroke Brother    Heart disease Father    Stroke Sister    Breast cancer Neg Hx       History reviewed. No pertinent family history.    Review of Systems unable to obtain secondary to dementia    Vitals:   02/18/20 1010  BP: 129/80  Pulse: 93  Resp: 19  Temp: (!) 97.2 F (36.2 C)  SpO2: 95%    SpO2 Readings from Last 1 Encounters:  02/18/20 95%   Body mass index is 18.68 kg/m.     Physical Exam  GENERAL APPEARANCE: Alert, conversant,  No acute distress.  SKIN: No diaphoresis rash HEAD: Normocephalic, atraumatic  EYES: Conjunctiva/lids clear. Pupils round, reactive. EOMs intact.  EARS:  External exam WNL, canals clear. Hearing grossly normal.  NOSE: No deformity or discharge.  MOUTH/THROAT: Lips w/o lesions  RESPIRATORY: Breathing is even, unlabored. Lung sounds are clear   CARDIOVASCULAR: Heart RRR no murmurs, rubs or gallops. No peripheral edema.   GASTROINTESTINAL: Abdomen is soft, non-tender, not distended w/ normal bowel sounds. GENITOURINARY: Bladder non tender, not distended  MUSCULOSKELETAL: No abnormal joints or musculature NEUROLOGIC:  Cranial nerves 2-12 grossly intact. Moves all extremities  PSYCHIATRIC: Dementia.,, no behavioral issues  Patient Active Problem List   Diagnosis Date Noted  . Hypotension 02/11/2020  . Pulmonary embolism ([place]) 02/11/2020  . Pulmonary mass 02/11/2020  . Hypertension 02/11/2020  . GERD (gastroesophageal reflux disease) 02/11/2020  . Fracture, proximal femur ([place]) 12/19/2019  . History of total left hip arthroplasty 12/19/2019  . Radius distal fracture 12/19/2019  . Pulmonary nodules/lesions, multiple 12/19/2019  . Acute respiratory failure with hypoxia ([place]) 12/19/2019  . Rheumatoid arthritis ([place]) 12/19/2019  . 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 Hyperlipidemia 12/19/2019  . Depression 12/19/2019  . HNP (herniated nucleus pulposus), cervical 05/10/2015      Labs reviewed: Basic Metabolic Panel:    Component Value Date/Time   NA 141 02/14/2020 0000   K 4.1 02/14/2020 0000   CL 106 02/14/2020 0000   CO2 24 (A) 02/14/2020 0000   GLUCOSE 125 (H) 12/04/2019 1620   BUN 11 02/14/2020 0000   CREATININE 0.9 02/14/2020 0000   CREATININE 1.67 (H) 12/04/2019 1620   CALCIUM 8.0 (A) 02/17/2020 0000   PROT 7.2 09/13/2013 1035   ALBUMIN 2.7 (A) 12/11/2019 0000   AST 33 12/11/2019 0000   ALT 17 12/11/2019 0000   ALKPHOS 111 12/11/2019 0000   BILITOT 0.3 09/13/2013 1035   GFRNONAA 62.12 02/14/2020 0000   GFRAA 71.99 02/14/2020 0000    Recent Labs    12/04/19 1620 12/21/19 0000  01/06/20 0000 01/06/20 0000 02/02/20 0000 02/14/20 0000 02/17/20 0000  NA 141   < > 138  --  140 141  --   K 4.2   < > 4.6  --  3.5 4.1  --   CL 107   < > 102  --  109* 106  --   CO2 25   < > 24*  --  25* 24*  --   GLUCOSE 125*  --   --   --   --   --   --   BUN 20   < > 14  --  15 11  --   CREATININE 1.67*   < > 1.0  --  0.8 0.9  --   CALCIUM 8.9   < > 9.6   < > 7.7* 8.3* 8.0*   < > = values in this interval not displayed.   Liver Function Tests: Recent Labs    12/11/19 0000  AST 33  ALT 17  ALKPHOS 111  ALBUMIN 2.7*   No results for input(s): LIPASE, AMYLASE in the last 8760 hours. No results for input(s): AMMONIA in the last 8760 hours. CBC: Recent Labs    12/04/19 1620 12/04/19 1620 12/21/19 0000 12/21/19 0000 02/02/20 0000 02/14/20 0000 02/17/20 0000  WBC 10.9*  --  10.9   < > 13.2 7.9 7.4  NEUTROABS 9.4*  --  9  --   --  6 6  HGB 10.2*   < > 10.0*   < > 8.8* 6.3* 8.2*  HCT 33.3*   < > 31*   < > 27* 19* 25*  MCV 90.0  --   --   --   --   --   --   PLT 328   < > 454*   < > 401* 505* 384   < > = values in this interval not displayed.   Lipid No results for input(s): CHOL, HDL, LDLCALC, TRIG in the last 8760 hours.  Cardiac Enzymes: No results for input(s): CKTOTAL, CKMB, CKMBINDEX, TROPONINI in the last 8760 hours. BNP: No results for input(s): BNP in the last 8760 hours. No results found for: MICROALBUR No results found for: HGBA1C No results found for: TSH No results found for: VITAMINB12 No results found for: FOLATE No results found for: IRON, TIBC, FERRITIN  Imaging and Procedures obtained prior to SNF admission: DG Forearm Left  Result Date: 12/04/2019 CLINICAL DATA:  Left forearm pain and deformity and bruising secondary to a fall at home today. EXAM: LEFT FOREARM - 2 VIEW COMPARISON:  None. FINDINGS: There is an impacted fracture of the metaphysis of the distal left radius. Tiny avulsion of the ulnar styloid. Chronic arthritic changes in the  wrist. Proximal radius and ulna appear normal. No elbow joint effusion. No dislocations. IMPRESSION: 1. Impacted fracture of the metaphysis of the distal left radius. 2. Tiny avulsion of the ulnar styloid. Electronically Signed   By: Lorriane Shire M.D.   On: 12/04/2019 18:09   CT Head Wo Contrast  Result Date: 12/04/2019 CLINICAL DATA:  Head trauma secondary to a fall this morning. Headache. Left arm pain and left leg pain and left wrist swelling. EXAM: CT HEAD WITHOUT CONTRAST CT CERVICAL SPINE WITHOUT CONTRAST TECHNIQUE: Multidetector CT imaging of the head and cervical spine was performed following the standard protocol without intravenous contrast. Multiplanar CT image reconstructions of the cervical spine were also generated. COMPARISON:  CT scan of the head dated 11/28/2015 and CT scan of the cervical spine dated 12/10/2015 FINDINGS: CT HEAD FINDINGS Brain: No evidence of acute infarction, hemorrhage, hydrocephalus, extra-axial collection or mass lesion/mass effect. Diffuse atrophy, most severe in the temporal lobes. Old tiny lacunar infarcts in the periventricular white matter and basal ganglia. Vascular: No hyperdense vessel or unexpected calcification. Skull: Normal.  Negative for fracture or focal lesion. Sinuses/Orbits: Normal. Other: None CT CERVICAL SPINE FINDINGS Alignment: Chronic straightening and slight reversal of the cervical lordosis. Chronic 2 mm anterolisthesis of C3 on C4. Skull base and vertebrae: Patient has had posterior decompression from C3-4 through C7-T1. There is solid posterior fusion from C3 through T1. There is chronic hypertrophy of the transverse ligament at C1-2, increased since the prior study but without significant compromise of the spinal canal. Soft tissues and spinal canal: The paraspinal soft tissues are normal. No visible canal hematoma. Disc levels: C2-3: No disc bulging or protrusion. Moderate right facet arthritis. C3-4 through C7-T1: Solid posterior fusion at  each level. Posterior rods and screws in place with no evidence of loosening. No acute abnormalities. No change since the prior study. T1-2: No disc bulging or protrusion. Moderate right facet arthritis, chronic. Upper chest: Negative. Other: None IMPRESSION: 1. No acute intracranial abnormality. Diffuse atrophy, most severe in the temporal lobes. 2. No acute abnormality of the cervical spine. Chronic hypertrophy of the transverse ligament at C1-2, increased since the prior study but without significant compromise of the spinal canal. 3. Solid posterior fusion from C3 through T1. Electronically Signed   By: Lorriane Shire M.D.   On: 12/04/2019 18:01   CT Cervical Spine Wo Contrast  Result Date: 12/04/2019 CLINICAL DATA:  Head trauma secondary to a fall this morning. Headache. Left arm pain and left leg pain and left wrist swelling. EXAM: CT HEAD WITHOUT CONTRAST CT CERVICAL SPINE WITHOUT CONTRAST TECHNIQUE: Multidetector CT imaging of the head and cervical spine was performed following the standard protocol without intravenous contrast. Multiplanar CT image reconstructions of the cervical spine were also generated. COMPARISON:  CT scan of the head dated 11/28/2015 and CT scan of the cervical spine dated 12/10/2015 FINDINGS: CT HEAD FINDINGS Brain: No evidence of acute infarction, hemorrhage, hydrocephalus, extra-axial collection or mass lesion/mass effect. Diffuse atrophy, most severe in the temporal lobes. Old tiny lacunar infarcts in the periventricular white matter and basal ganglia. Vascular: No hyperdense vessel or unexpected calcification. Skull: Normal. Negative for fracture or focal lesion. Sinuses/Orbits: Normal. Other: None CT CERVICAL SPINE FINDINGS Alignment: Chronic straightening and slight reversal of the cervical lordosis. Chronic 2 mm anterolisthesis of C3 on C4. Skull base and vertebrae: Patient has had posterior decompression from C3-4 through C7-T1. There is solid posterior fusion from C3  through T1. There is chronic hypertrophy of the transverse ligament at C1-2, increased since the prior study but without significant compromise of the spinal canal. Soft tissues and spinal canal: The paraspinal soft tissues are normal. No visible canal hematoma. Disc levels: C2-3: No disc bulging or protrusion. Moderate right facet arthritis. C3-4 through C7-T1: Solid posterior fusion at each level. Posterior rods and screws in place with no evidence of loosening. No acute abnormalities. No change since the prior study. T1-2: No disc bulging or protrusion. Moderate right facet arthritis, chronic. Upper chest: Negative. Other: None IMPRESSION: 1. No acute intracranial abnormality. Diffuse atrophy, most severe in the temporal lobes. 2. No acute abnormality of the cervical spine. Chronic hypertrophy of the transverse ligament at C1-2, increased since the prior study but without significant compromise of the spinal canal. 3. Solid posterior fusion from C3 through T1. Electronically Signed   By: Lorriane Shire M.D.   On: 12/04/2019 18:01   CT HIP LEFT WO CONTRAST  Result Date: 12/04/2019 CLINICAL DATA:  Fall, left hip pain EXAM: CT OF THE LEFT HIP WITHOUT CONTRAST TECHNIQUE: Multidetector CT imaging of  the left hip was performed according to the standard protocol. Multiplanar CT image reconstructions were also generated. COMPARISON:  Same-day x-ray FINDINGS: Bones/Joint/Cartilage There is an acute fracture involving the tip of the left greater trochanter which is displaced proximally by 1.4 cm. Fracture is mildly comminuted. Gluteus medius tendon appears intact inserting on the avulsed fragment (series 5, images 19-22). The anterior aspect of the greater trochanter at the gluteus minimus insertion site is fractured without significant displacement. Fracture does not extend to the lesser trochanter, femoral neck, or femoral head. Left hip joint is intact without dislocation. There is a chronic appearing fracture  deformity of the parasymphyseal aspect of the left pubic bone with irregular fragmentation. Fragmentations largely demonstrate corticated margins. There is overlying soft tissue thickening without hematoma. Ligaments Suboptimally assessed by CT. Muscles and Tendons Left gluteal tendon findings, as above. Remaining muscular and tendinous structures about the left hip are intact. Soft tissues No soft tissue hematoma. No acute findings within the visualized deep pelvis. IMPRESSION: 1. Acute fracture involving the tip of the left greater trochanter which is displaced proximally by 1.4 cm. 2. Chronic appearing fracture deformity of the parasymphyseal aspect of the left pubic bone with irregular fragmentation. Electronically Signed   By: Davina Poke M.D.   On: 12/04/2019 19:17   DG Hand Complete Left  Result Date: 12/04/2019 CLINICAL DATA:  Left wrist bruising and deformity secondary to a fall today. EXAM: LEFT HAND - COMPLETE 3+ VIEW COMPARISON:  Hand radiographs dated 01/24/2016 FINDINGS: There is an impacted slightly dorsally displaced and dorsally angulated fracture of the metaphysis of the distal left radius. Tiny avulsion of the ulnar styloid. Chronic severe degenerative changes in the wrist, stable. Diffuse arthritic changes in the IP joints. Chronic Radiodense foreign body in the soft tissues of the volar aspect of the base of the left index finger. IMPRESSION: 1. Acute fracture of the metaphysis of the distal left radius. 2. Tiny avulsion of the ulnar styloid. 3. Radiodense foreign body in the soft tissues of the base of the left index finger. 4. Diffuse arthritic changes, stable. Electronically Signed   By: Lorriane Shire M.D.   On: 12/04/2019 18:08   DG Hip Unilat W or Wo Pelvis 2-3 Views Left  Result Date: 12/04/2019 CLINICAL DATA:  Left hip pain secondary to a fall today. EXAM: DG HIP (WITH OR WITHOUT PELVIS) 2-3V LEFT COMPARISON:  MRI dated 09/19/2016 FINDINGS: There is an avulsed distracted  fracture of the tip of the left greater trochanter of the proximal left femur. There is no intertrochanteric extent of the fracture in the left femoral neck is intact. There is a deformity of the left pubic body which is new since the prior MRI. Is the patient tender over the symphysis pubis? This may be an old injury. Note is made of hardware in the right hip from previous open reduction internal fixation of proximal right femur fracture. IMPRESSION: 1. Acute avulsed fracture of the tip of the greater trochanter of the proximal left femur. 2. Deformity of the left pubic body which is new since the prior MRI. This may be an old injury. Electronically Signed   By: Lorriane Shire M.D.   On: 12/04/2019    Not all labs, radiology exams or other studies done during hospitalization come through on my EPIC note; however they are reviewed by me.    Assessment and Plan  Anemia due to GI blood loss/gastric angiodysplasias -presentation with M eleven 6.1, transfuse 1 unit PRBC with follow-up  CBC showed a hemoglobin of 7.5; EGD showing gastric angiodysplasias which were cauterized but no other mucosal lesions; okay to restart Eliquis  History of bilateral PE SNF-on Eliquis for same; CBC twice weekly for 3 months  Severe dementia SNF-continue Aricept 10 mg daily and Namenda 20 mg XR 1 p.o. daily  Rheumatoid arthritis SNF-appears controlled; continue Arava 20 mg Monday Wednesday Friday Sunday and Deltasone 5 mg Monday Wednesday Friday Sunday  Hyperlipidemia SNF-not stated as uncontrolled; continue Lipitor 10 mg daily  Depression SNF-continue Wellbutrin 300 mg nightly, Remeron 30 mg nightly and Zoloft 150 mg daily   Time spent greater than 35 minutes;> 50% of time with patient was spent reviewing records, labs, tests and studies, counseling and developing plan of care Hennie Duos, MD

## 2020-02-19 ENCOUNTER — Encounter: Payer: Self-pay | Admitting: Internal Medicine

## 2020-02-19 DIAGNOSIS — D649 Anemia, unspecified: Secondary | ICD-10-CM | POA: Insufficient documentation

## 2020-02-19 DIAGNOSIS — F03C0 Unspecified dementia, severe, without behavioral disturbance, psychotic disturbance, mood disturbance, and anxiety: Secondary | ICD-10-CM | POA: Insufficient documentation

## 2020-02-19 DIAGNOSIS — F0390 Unspecified dementia without behavioral disturbance: Secondary | ICD-10-CM | POA: Insufficient documentation

## 2020-02-19 DIAGNOSIS — K31819 Angiodysplasia of stomach and duodenum without bleeding: Secondary | ICD-10-CM | POA: Insufficient documentation

## 2020-02-24 ENCOUNTER — Other Ambulatory Visit: Payer: Self-pay | Admitting: Internal Medicine

## 2020-02-24 MED ORDER — HYDROCODONE-ACETAMINOPHEN 5-325 MG PO TABS: 1.0000 | ORAL_TABLET | Freq: Two times a day (BID) | ORAL | 0 refills | Status: DC

## 2020-03-01 ENCOUNTER — Non-Acute Institutional Stay (SKILLED_NURSING_FACILITY): Payer: Medicare HMO | Admitting: Internal Medicine

## 2020-03-01 ENCOUNTER — Encounter: Payer: Self-pay | Admitting: Internal Medicine

## 2020-03-01 DIAGNOSIS — M0579 Rheumatoid arthritis with rheumatoid factor of multiple sites without organ or systems involvement: Secondary | ICD-10-CM | POA: Diagnosis not present

## 2020-03-01 DIAGNOSIS — I2699 Other pulmonary embolism without acute cor pulmonale: Secondary | ICD-10-CM

## 2020-03-01 DIAGNOSIS — Z96642 Presence of left artificial hip joint: Secondary | ICD-10-CM | POA: Diagnosis not present

## 2020-03-01 DIAGNOSIS — F0390 Unspecified dementia without behavioral disturbance: Secondary | ICD-10-CM

## 2020-03-01 DIAGNOSIS — K31819 Angiodysplasia of stomach and duodenum without bleeding: Secondary | ICD-10-CM

## 2020-03-01 DIAGNOSIS — D6489 Other specified anemias: Secondary | ICD-10-CM | POA: Diagnosis not present

## 2020-03-01 NOTE — Progress Notes (Signed)
Location:    [facility] Room Number: 101/P Place of Service:  SNF [phone])  Provider: Lorne Skeens  PCP: Hennie Duos, MD Patient Care Team: Hennie Duos, MD as PCP - General (Internal Medicine)  Extended Emergency Contact Information Primary Emergency Contact: Karle Plumber Address: [address] [place], [place] [address] Johnnette Litter of [place] Phone: [phone] Relation: Friend Secondary Emergency Contact: Tobie Poet Mobile Phone: [phone] Relation: Other  Code Status: DNR Goals of care:  Advanced Directive information Advanced Directives 03/01/2020  Does Patient Have a Medical Advance Directive? Yes  Type of Advance Directive Out of facility DNR (pink MOST or yellow form);[place];Living will  Does patient want to make changes to medical advance directive? No - Patient declined  Copy of [place] in Chart? Yes - validated most recent copy scanned in chart (See row information)  Pre-existing out of facility DNR order (yellow form or pink MOST form) Yellow form placed in chart (order not valid for inpatient use)     Allergies  Allergen Reactions  . Methotrexate Other (See Comments)    Severe pancytopenia  Severe pancytopenia    . Cymbalta [Duloxetine Hcl] Rash  . Duloxetine Rash    Rash with Itching 08/20/10    Chief Complaint  Patient presents with  . Discharge Note    Discharge Visit    HPI:  86 y.o. female seen today for discharge from facility on Friday, March 03, 2020. She has been here for rehab after most recent hospitalization for anemia with acute blood loss.  She was initially admitted here after hospitalization for a fracture of the left hip because of osteoporosis and nonunion-she had an IM nailing and a total hip arthroplasty-this required 2 separate surgeries  Her surgery was complicated with postop bilateral pulmonary embolisms.  She also had a  spiculated mass of the left lower lobe-family was aware of this previously and follow-up was not pursued until patient had recovered from her current issues.  She was started on Eliquis.  However lab in the facility show that her hemoglobin had dropped to 6.1.  She was sent to the ER.  There she received a transfusion her Hemoccult stool was positive and Eliquis was held.  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  She is also on iron.   In regards to other medical issues she does have a history of dementia and continues on Aricept and Namenda-at times she does have confusion.  She also has a history of hypertension and continues on Norvasc this appears stable recent blood pressures 120/63-118/62.  She will be going home and will have support at home.  Currently she has no complaint-she appears to be in good spirits and is glad to be going home.  She will need PT and OT as as well    Past Medical History:  Diagnosis Date  . Arthritis   . Cough    loose  started 05/05/15 no fever  . Dementia ([place])    STARTING SIGNS OF DEMENTIA  . Depression    TAKING ZOLOFT FOR A LONG TIME  . Headache(784.0)   . Hypertension   . Rheumatoid arthritis [facility])     Past Surgical History:  Procedure Laterality Date  . CARPAL TUNNEL RELEASE     RIGHT  . EYE SURGERY  BILATERAL CATARACTS  . FRACTURE SURGERY     RIGHT HIP-HAS SCREW  . POSTERIOR CERVICAL FUSION/FORAMINOTOMY N/A 09/20/2013   Procedure: CERVICAL THREE TO CERVICAL SEVEN  POSTERIOR CERVICAL FUSION;  Surgeon: Elaina Hoops, MD;  Location: [facility];  Service: Neurosurgery;  Laterality: N/A;  CERVICAL THREE TO CERVICAL SEVEN  POSTERIOR CERVICAL FUSION  . POSTERIOR CERVICAL FUSION/FORAMINOTOMY N/A 05/10/2015   Procedure: Posterior Cervical Laminectomy Diskectomy and Fusion Cervical seven Thoracic one. Exploration of Fusion Removal of Hardware Cervical three to seven;   Surgeon: Kary Kos, MD;  Location: [facility];  Service: Neurosurgery;  Laterality: N/A;  . ROTATOR CUFF REPAIR     RIGHT SHOULDER  . SHOULDER ARTHROSCOPY     ? LEFT BONE SPURSS      reports that she has been smoking cigarettes. She has a 17.00 pack-year smoking history. She has never used smokeless tobacco. She reports that she does not drink alcohol or use drugs. Social History   Socioeconomic History  . Marital status: Divorced    Spouse name: Not on file  . Number of children: Not on file  . Years of education: Not on file  . Highest education level: Not on file  Occupational History  . Not on file  Tobacco Use  . Smoking status: Current Every Day Smoker    Packs/day: 0.25    Years: 68.00    Pack years: 17.00    Types: Cigarettes  . Smokeless tobacco: Never Used  Substance and Sexual Activity  . Alcohol use: No  . Drug use: No  . Sexual activity: Not on file  Other Topics Concern  . Not on file  Social History Narrative   Lives at home, divorced. Has a 60-pack year history of smoking.   Social Determinants of Health   Financial Resource Strain:   . Difficulty of Paying Living Expenses: Not on file  Food Insecurity:   . Worried About Charity fundraiser in the Last Year: Not on file  . Ran Out of Food in the Last Year: Not on file  Transportation Needs:   . Lack of Transportation (Medical): Not on file  . Lack of Transportation (Non-Medical): Not on file  Physical Activity:   . Days of Exercise per Week: Not on file  . Minutes of Exercise per Session: Not on file  Stress:   . Feeling of Stress : Not on file  Social Connections:   . Frequency of Communication with Friends and Family: Not on file  . Frequency of Social Gatherings with Friends and Family: Not on file  . Attends Religious Services: Not on file  . Active Member of Clubs or Organizations: Not on file  . Attends Archivist Meetings: Not on file  . Marital Status: Not on file  Intimate  Partner Violence:   . Fear of Current or Ex-Partner: Not on file  . Emotionally Abused: Not on file  . Physically Abused: Not on file  . Sexually Abused: Not on file   Functional Status Survey:    Allergies  Allergen Reactions  . Methotrexate Other (See Comments)    Severe pancytopenia  Severe pancytopenia    . Cymbalta [Duloxetine Hcl] Rash  . Duloxetine Rash    Rash with Itching 08/20/10    Pertinent  Health Maintenance Due  Topic Date Due  . DEXA SCAN  11/03/1998  . INFLUENZA VACCINE  Completed  . PNA vac Low Risk Adult  Completed    Medications: Allergies as  of 03/01/2020      Reactions   Methotrexate Other (See Comments)   Severe pancytopenia  Severe pancytopenia    Cymbalta [duloxetine Hcl] Rash   Duloxetine Rash   Rash with Itching 08/20/10      Medication List       Accurate as of March 01, 2020  4:45 PM. If you have any questions, ask your nurse or doctor.        acetaminophen 325 MG tablet Commonly known as: TYLENOL Take 650 mg by mouth every 6 (six) hours as needed (For Pain).   albuterol 108 (90 Base) MCG/ACT inhaler Commonly known as: VENTOLIN HFA Inhale 2 puffs into the lungs every 6 (six) hours as needed for wheezing or shortness of breath.   ALPRAZolam 0.25 MG tablet Commonly known as: XANAX Take 0.25 mg by mouth every 6 (six) hours as needed for anxiety.   amLODipine 5 MG tablet Commonly known as: NORVASC Take 0.5 tablets (2.5 mg total) by mouth daily.   ascorbic acid 500 MG tablet Commonly known as: VITAMIN C Take 1 tablet (500 mg total) by mouth daily.   atorvastatin 10 MG tablet Commonly known as: LIPITOR Take 1 tablet (10 mg total) by mouth at bedtime. FOR HYPERCHOLESTEROLEMIA   buPROPion 300 MG 24 hr tablet Commonly known as: WELLBUTRIN XL Take 1 tablet (300 mg total) by mouth at bedtime.   calcium carbonate 600 MG Tabs tablet Commonly known as: OS-CAL Take 600 mg by mouth daily with breakfast.   donepezil 10 MG  tablet Commonly known as: ARICEPT Take 1 tablet (10 mg total) by mouth 2 (two) times daily.   Eliquis 5 MG Tabs tablet Generic drug: apixaban Take 5 mg by mouth 2 (two) times daily.   Ensure Take 237 mLs by mouth 2 (two) times daily. Lactose reduced   NUTRITIONAL SUPPLEMENT PO Take 1 each by mouth in the morning and at bedtime. L Magic Cup with lunch and dinner   ferrous sulfate 325 (65 FE) MG tablet Take 1 tablet (325 mg total) by mouth 2 (two) times daily with a meal. For Anemia   HYDROcodone-acetaminophen 5-325 MG tablet Commonly known as: NORCO/VICODIN Take 1 tablet by mouth 2 (two) times daily.   leflunomide 20 MG tablet Commonly known as: ARAVA Take 1 tablet (20 mg total) by mouth daily. Take on M-W-F-Su   memantine 28 MG Cp24 24 hr capsule Commonly known as: NAMENDA XR Take 1 capsule (28 mg total) by mouth daily.   mirtazapine 30 MG tablet Commonly known as: REMERON Take 1 tablet (30 mg total) by mouth every evening.   multivitamin with minerals Tabs tablet Take 1 tablet by mouth daily.   omeprazole 20 MG capsule Commonly known as: PRILOSEC Take 1 capsule (20 mg total) by mouth daily.   potassium chloride SA 20 MEQ tablet Commonly known as: KLOR-CON Take 1 tablet (20 mEq total) by mouth daily.   predniSONE 5 MG tablet Commonly known as: DELTASONE Take 5 mg by mouth daily with breakfast. Take on M-W-F-Sun   senna-docusate 8.6-50 MG tablet Commonly known as: Senokot-S Take 1 tablet by mouth every evening.   sertraline 100 MG tablet Commonly known as: ZOLOFT Take 1.5 tablets (150 mg total) by mouth daily.   sulfamethoxazole-trimethoprim 800-160 MG tablet Commonly known as: Bactrim DS TAKE 1 TABLET BY MOUTH EVERY MONDAY - WEDNESDAY - FRIDAY FOR PCP PROPHYLAXIS DUE TO CHRONIC STEROID USE   vitamin B-12 1000 MCG tablet Commonly known as: CYANOCOBALAMIN Take 1 tablet (  1,000 mcg total) by mouth daily.       Review of Systems   General she is not  complaining of any fever chills says she feels well.  Skin is not complaining of rashes or itching.  Head ears eyes nose mouth and throat is not complain of visual changes or sore throat.  Respiratory no complaints of shortness of breath or cough.  Cardiac does not complain of chest pain or increasing edema.  GI is not complaining of abdominal pain nausea vomiting diarrhea constipation.  GU no complaints of dysuria.  Musculoskeletal does not really complain of pain at this point the Norco appears to be effective and well-tolerated.  Neurologic does not complain of dizziness headache numbness.  And psych is not complaining being depressed or anxious appears to be in good spirits she does have dementia which appears to be moderate mild-at times apparently has some slight agitation  Vitals:   03/01/20 1606  BP: 120/63  Pulse: 82  Resp: 18  Temp: (!) 97.1 F (36.2 C)  TempSrc: Oral  SpO2: 95%  Weight: 108 lb 12.8 oz (49.4 kg)  Height: 5\' 4"  (1.626 m)   Body mass index is 18.68 kg/m. Physical Exam In general this is a pleasant elderly female in no distress.  Her skin is warm and dry-.  Eyes visual acuity appears to be intact sclera and conjunctive are clear.  Oropharynx clear mucous membranes moist.  Chest is clear to auscultation there is no labored breathing.  Heart is regular rate and rhythm with a slight systolic murmur she has quite mild left lower extremity edema this is cool to touch nonerythematous nontender pedal pulse is intact.  Abdomen is soft nontender with positive bowel sounds.  Musculoskeletal is able to move all extremities x4-strength appears to be intact lower extremities bilaterally.  Neurologic is grossly intact her speech is clear cannot appreciate lateralizing findings.  Psych she is pleasant and appropriate dementia appears to be mild to moderate  Labs reviewed: Basic Metabolic Panel: Recent Labs    12/04/19 1620 12/21/19 0000  01/06/20 0000 01/06/20 0000 02/02/20 0000 02/14/20 0000 02/17/20 0000  NA 141   < > 138  --  140 141  --   K 4.2   < > 4.6  --  3.5 4.1  --   CL 107   < > 102  --  109* 106  --   CO2 25   < > 24*  --  25* 24*  --   GLUCOSE 125*  --   --   --   --   --   --   BUN 20   < > 14  --  15 11  --   CREATININE 1.67*   < > 1.0  --  0.8 0.9  --   CALCIUM 8.9   < > 9.6   < > 7.7* 8.3* 8.0*   < > = values in this interval not displayed.   Liver Function Tests: Recent Labs    12/11/19 0000  AST 33  ALT 17  ALKPHOS 111  ALBUMIN 2.7*   No results for input(s): LIPASE, AMYLASE in the last 8760 hours. No results for input(s): AMMONIA in the last 8760 hours. CBC: Recent Labs    12/04/19 1620 12/04/19 1620 12/21/19 0000 12/21/19 0000 02/02/20 0000 02/14/20 0000 02/17/20 0000  WBC 10.9*  --  10.9   < > 13.2 7.9 7.4  NEUTROABS 9.4*  --  9  --   --  6 6  HGB 10.2*   < > 10.0*   < > 8.8* 6.3* 8.2*  HCT 33.3*   < > 31*   < > 27* 19* 25*  MCV 90.0  --   --   --   --   --   --   PLT 328   < > 454*   < > 401* 505* 384   < > = values in this interval not displayed.   Cardiac Enzymes: No results for input(s): CKTOTAL, CKMB, CKMBINDEX, TROPONINI in the last 8760 hours. BNP: Invalid input(s): POCBNP CBG: No results for input(s): GLUCAP in the last 8760 hours.  Procedures and Imaging Studies During Stay: No results found.  Assessment/Plan:     --#1 history of blood loss anemia from GI loss-she was thought to have gastric angiodysplastic areas of what were cauterized-she received 1 unit packed red blood cells hemoglobin rose to 7.5 on most recent lab is up to 8.2 will recheck this before discharge.  Eliquis was held but has since been restarted.  She is also on Prilosec and iron 325 mg twice daily.  2.  History of DVT she is on Eliquis which is been restarted at 5 mg twice daily.  3.  History of left hip fracture status post repair x2-she will need continued PT and OT she has made  progress pain appears to be controlled she does receive Norco twice a day-- she will need orthopedic follow-up.  4.  History of hypertension she is on Norvasc 2.5 mg a day this appears stable recent blood pressures 120/63-118/62.  5.  History of rheumatoid arthritis she is on Arava 20 mg Monday Wednesday and Friday and Sunday.-This was prednisone 5 mg Monday Wednesday Friday and Sunday.  6.  History of depression she is on Wellbutrin 300 mg nightly she is also on Zoloft 150 mg a day and Remeron 30 mg a day this appears to be stable.  7.  History of suspicious lung nodule-further work-up was deferred until her orthopedic issues are improved this will need follow-up as an outpatient.  8.  History of COPD she is on albuterol inhalers as needed and this has not really been an issue during her stay here.  9.  History of dementia she appears to be stable she is on Aricept 10 mg a day and Namenda 20 mg a day-nursing is not noted any recent acute behaviors at times she will have some mild agitation.  10.  History of hypokalemia she is on potassium will update a level before discharge.  #11-she does continue on Bactrim 3 times a week as well for UTI prophylaxis  She will be going home she will have support-she will need PT and OT-as well as orthopedic follow-up which is been arranged later this month-she also will need expedient primary care follow-up.-A limited supply of Norco--30 tabs were prescribed she will need follow-up by primary care provider and orthopedics for any refills  CPT 99316-of note greater than 30 minutes spent on this discharge summary-greater than 50% of time spent coordinating a plan of care  Addendum.  Labs done on March 02, 2020 are reassuring her hemoglobin is up to 9.5-white count is normal at 7.1 platelets of 433.  Sodium is 140 potassium 4.5 BUN 11.6 and creatinine 0.81

## 2020-03-03 MED ORDER — DONEPEZIL HCL 10 MG PO TABS: 10.0000 mg | ORAL_TABLET | Freq: Two times a day (BID) | ORAL | 0 refills | Status: AC

## 2020-03-03 MED ORDER — MIRTAZAPINE 30 MG PO TABS: 30.0000 mg | ORAL_TABLET | Freq: Every evening | ORAL | 0 refills | Status: AC

## 2020-03-03 MED ORDER — BUPROPION HCL ER (XL) 300 MG PO TB24: 300.0000 mg | ORAL_TABLET | Freq: Every day | ORAL | 0 refills | Status: AC

## 2020-03-03 MED ORDER — ALBUTEROL SULFATE HFA 108 (90 BASE) MCG/ACT IN AERS: 2.0000 | INHALATION_SPRAY | Freq: Four times a day (QID) | RESPIRATORY_TRACT | 0 refills | Status: AC | PRN

## 2020-03-03 MED ORDER — PREDNISONE 5 MG PO TABS: 5.0000 mg | ORAL_TABLET | Freq: Every day | ORAL | 0 refills | Status: DC

## 2020-03-03 MED ORDER — OMEPRAZOLE 20 MG PO CPDR: 20.0000 mg | DELAYED_RELEASE_CAPSULE | Freq: Every day | ORAL | 0 refills | Status: AC

## 2020-03-03 MED ORDER — LEFLUNOMIDE 20 MG PO TABS: 20.0000 mg | ORAL_TABLET | Freq: Every day | ORAL | 0 refills | Status: AC

## 2020-03-03 MED ORDER — POTASSIUM CHLORIDE CRYS ER 20 MEQ PO TBCR: 20.0000 meq | EXTENDED_RELEASE_TABLET | Freq: Every day | ORAL | 0 refills | Status: DC

## 2020-03-03 MED ORDER — HYDROCODONE-ACETAMINOPHEN 5-325 MG PO TABS: 1.0000 | ORAL_TABLET | Freq: Two times a day (BID) | ORAL | 0 refills | Status: DC

## 2020-03-03 MED ORDER — SULFAMETHOXAZOLE-TRIMETHOPRIM 800-160 MG PO TABS: ORAL_TABLET | ORAL | 0 refills | Status: DC

## 2020-03-03 MED ORDER — APIXABAN 5 MG PO TABS: 5.0000 mg | ORAL_TABLET | Freq: Two times a day (BID) | ORAL | 0 refills | Status: AC

## 2020-03-03 MED ORDER — VITAMIN B-12 1000 MCG PO TABS: 1000.0000 ug | ORAL_TABLET | Freq: Every day | ORAL | 0 refills | Status: AC

## 2020-03-03 MED ORDER — MEMANTINE HCL ER 28 MG PO CP24: 28.0000 mg | ORAL_CAPSULE | Freq: Every day | ORAL | 0 refills | Status: AC

## 2020-03-03 MED ORDER — ATORVASTATIN CALCIUM 10 MG PO TABS: 10.0000 mg | ORAL_TABLET | Freq: Every day | ORAL | 0 refills | Status: AC

## 2020-03-03 MED ORDER — AMLODIPINE BESYLATE 5 MG PO TABS: 2.5000 mg | ORAL_TABLET | Freq: Every day | ORAL | 0 refills | Status: DC

## 2020-03-03 MED ORDER — SERTRALINE HCL 100 MG PO TABS: 150.0000 mg | ORAL_TABLET | Freq: Every day | ORAL | 0 refills | Status: AC

## 2020-03-17 ENCOUNTER — Other Ambulatory Visit: Payer: Self-pay | Admitting: Internal Medicine

## 2020-03-17 MED ORDER — HYDROCODONE-ACETAMINOPHEN 5-325 MG PO TABS: 1.0000 | ORAL_TABLET | Freq: Two times a day (BID) | ORAL | 0 refills | Status: DC

## 2020-03-25 ENCOUNTER — Other Ambulatory Visit: Payer: Self-pay | Admitting: Internal Medicine

## 2020-04-11 ENCOUNTER — Other Ambulatory Visit: Payer: Self-pay | Admitting: Internal Medicine

## 2020-04-22 ENCOUNTER — Other Ambulatory Visit: Payer: Self-pay | Admitting: Internal Medicine

## 2020-06-26 ENCOUNTER — Emergency Department (HOSPITAL_BASED_OUTPATIENT_CLINIC_OR_DEPARTMENT_OTHER): Payer: Medicare HMO

## 2020-06-26 ENCOUNTER — Encounter (HOSPITAL_BASED_OUTPATIENT_CLINIC_OR_DEPARTMENT_OTHER): Payer: Self-pay | Admitting: *Deleted

## 2020-06-26 ENCOUNTER — Other Ambulatory Visit: Payer: Self-pay

## 2020-06-26 ENCOUNTER — Emergency Department (HOSPITAL_BASED_OUTPATIENT_CLINIC_OR_DEPARTMENT_OTHER)
Admission: EM | Admit: 2020-06-26 | Discharge: 2020-06-26 | Disposition: A | Discharge status: Home / Self Care | Payer: Medicare HMO | Attending: Emergency Medicine | Admitting: Emergency Medicine

## 2020-06-26 DIAGNOSIS — S0990XA Unspecified injury of head, initial encounter: Secondary | ICD-10-CM | POA: Diagnosis not present

## 2020-06-26 DIAGNOSIS — Z23 Encounter for immunization: Secondary | ICD-10-CM | POA: Insufficient documentation

## 2020-06-26 DIAGNOSIS — D649 Anemia, unspecified: Secondary | ICD-10-CM | POA: Insufficient documentation

## 2020-06-26 DIAGNOSIS — S0181XA Laceration without foreign body of other part of head, initial encounter: Secondary | ICD-10-CM | POA: Diagnosis not present

## 2020-06-26 DIAGNOSIS — Z7901 Long term (current) use of anticoagulants: Secondary | ICD-10-CM | POA: Insufficient documentation

## 2020-06-26 DIAGNOSIS — I10 Essential (primary) hypertension: Secondary | ICD-10-CM | POA: Insufficient documentation

## 2020-06-26 DIAGNOSIS — Y999 Unspecified external cause status: Secondary | ICD-10-CM | POA: Insufficient documentation

## 2020-06-26 DIAGNOSIS — Z79899 Other long term (current) drug therapy: Secondary | ICD-10-CM | POA: Insufficient documentation

## 2020-06-26 DIAGNOSIS — W19XXXA Unspecified fall, initial encounter: Secondary | ICD-10-CM | POA: Diagnosis not present

## 2020-06-26 DIAGNOSIS — Y92009 Unspecified place in unspecified non-institutional (private) residence as the place of occurrence of the external cause: Secondary | ICD-10-CM | POA: Insufficient documentation

## 2020-06-26 DIAGNOSIS — Z96612 Presence of left artificial shoulder joint: Secondary | ICD-10-CM | POA: Diagnosis not present

## 2020-06-26 DIAGNOSIS — R7989 Other specified abnormal findings of blood chemistry: Secondary | ICD-10-CM | POA: Diagnosis not present

## 2020-06-26 DIAGNOSIS — F0390 Unspecified dementia without behavioral disturbance: Secondary | ICD-10-CM | POA: Insufficient documentation

## 2020-06-26 DIAGNOSIS — F17210 Nicotine dependence, cigarettes, uncomplicated: Secondary | ICD-10-CM | POA: Insufficient documentation

## 2020-06-26 DIAGNOSIS — Y9301 Activity, walking, marching and hiking: Secondary | ICD-10-CM | POA: Insufficient documentation

## 2020-06-26 LAB — URINALYSIS, MICROSCOPIC (REFLEX): WBC, UA: NONE SEEN WBC/hpf (ref 0–5)

## 2020-06-26 LAB — CBC WITH DIFFERENTIAL/PLATELET
Abs Immature Granulocytes: 0.05 10*3/uL (ref 0.00–0.07)
Basophils Absolute: 0 10*3/uL (ref 0.0–0.1)
Basophils Relative: 0 %
Eosinophils Absolute: 0 10*3/uL (ref 0.0–0.5)
Eosinophils Relative: 0 %
HCT: 30.8 % — ABNORMAL LOW (ref 36.0–46.0)
Hemoglobin: 9 g/dL — ABNORMAL LOW (ref 12.0–15.0)
Immature Granulocytes: 1 %
Lymphocytes Relative: 10 %
Lymphs Abs: 0.9 10*3/uL (ref 0.7–4.0)
MCH: 24.5 pg — ABNORMAL LOW (ref 26.0–34.0)
MCHC: 29.2 g/dL — ABNORMAL LOW (ref 30.0–36.0)
MCV: 83.7 fL (ref 80.0–100.0)
Monocytes Absolute: 0.6 10*3/uL (ref 0.1–1.0)
Monocytes Relative: 6 %
Neutro Abs: 7.6 10*3/uL (ref 1.7–7.7)
Neutrophils Relative %: 83 %
Platelets: 407 10*3/uL — ABNORMAL HIGH (ref 150–400)
RBC: 3.68 MIL/uL — ABNORMAL LOW (ref 3.87–5.11)
RDW: 21.6 % — ABNORMAL HIGH (ref 11.5–15.5)
WBC: 9.1 10*3/uL (ref 4.0–10.5)
nRBC: 0 % (ref 0.0–0.2)

## 2020-06-26 LAB — URINALYSIS, ROUTINE W REFLEX MICROSCOPIC
Bilirubin Urine: NEGATIVE
Glucose, UA: NEGATIVE mg/dL
Ketones, ur: NEGATIVE mg/dL
Leukocytes,Ua: NEGATIVE
Nitrite: NEGATIVE
Protein, ur: NEGATIVE mg/dL
Specific Gravity, Urine: >1.03 — ABNORMAL HIGH (ref 1.005–1.030)
pH: 6 (ref 5.0–8.0)

## 2020-06-26 LAB — BASIC METABOLIC PANEL
Anion gap: 8 (ref 5–15)
BUN: 16 mg/dL (ref 8–23)
CO2: 24 mmol/L (ref 22–32)
Calcium: 9.2 mg/dL (ref 8.9–10.3)
Chloride: 107 mmol/L (ref 98–111)
Creatinine, Ser: 1.47 mg/dL — ABNORMAL HIGH (ref 0.44–1.00)
GFR calc Af Amer: 37 mL/min — ABNORMAL LOW (ref >60)
GFR calc non Af Amer: 32 mL/min — ABNORMAL LOW (ref >60)
Glucose, Bld: 101 mg/dL — ABNORMAL HIGH (ref 70–99)
Potassium: 4.9 mmol/L (ref 3.5–5.1)
Sodium: 139 mmol/L (ref 135–145)

## 2020-06-26 MED ORDER — LIDOCAINE-EPINEPHRINE (PF) 1 %-1:200000 IJ SOLN: 10.0000 mL | Freq: Once | INTRAMUSCULAR | Status: AC

## 2020-06-26 MED ORDER — LIDOCAINE-EPINEPHRINE (PF) 2 %-1:200000 IJ SOLN: 10.0000 mL | Freq: Once | INTRAMUSCULAR | Status: DC

## 2020-06-26 MED ORDER — TETANUS-DIPHTH-ACELL PERTUSSIS 5-2.5-18.5 LF-MCG/0.5 IM SUSP: 0.5000 mL | Freq: Once | INTRAMUSCULAR | Status: AC

## 2020-06-26 MED ORDER — TETANUS-DIPHTH-ACELL PERTUSSIS 5-2.5-18.5 LF-MCG/0.5 IM SUSP
INTRAMUSCULAR | Status: AC
  Administered 2020-06-26: 0.5 mL via INTRAMUSCULAR
  Filled 2020-06-26: qty 0.5

## 2020-06-26 MED ORDER — LIDOCAINE-EPINEPHRINE (PF) 1 %-1:200000 IJ SOLN
INTRAMUSCULAR | Status: AC
  Administered 2020-06-26: 10 mL via INTRADERMAL
  Filled 2020-06-26: qty 30

## 2020-06-26 NOTE — Discharge Instructions (Addendum)
Your hemoglobin was low at 9 although better than your prior values during your recent hospitalization.  Your creatinine (kidney function) was slightly elevated at 1.47.  You should follow-up with your doctor this week to have these values rechecked.  Keep your wound clean and dry.  You can use some triple antibiotic ointment but it does not need to be covered.  Follow-up with your doctor in 7 to 10 days for suture removal.  Return here as needed for any worsening symptoms or signs of infection.

## 2020-06-26 NOTE — ED Triage Notes (Addendum)
Pt c/o fall with lac to forehead. Denies LOC , pt is on blood thinner

## 2020-06-26 NOTE — ED Provider Notes (Addendum)
[place] EMERGENCY DEPARTMENT Provider Note   CSN: [id number] Arrival date & time: 06/26/20  1413     History Chief Complaint  Patient presents with  . Head Injury    Marcia Lynch is a 86 y.o. female.  Patient is a 86 year old female who presents after a fall.  History is limited due to dementia.  She has a history of hypertension, rheumatoid arthritis, and dementia and recent PE.  She lives at home with a family friend.  Friend is at bedside who says that she is often unsteady and walks really fast with her walker.  She was walking with her walker and sat down on the sofa hard, she fell back and they think she hit her head with her walker.  She did not fall off the couch.  They do not know if she got dizzy.  She seems to be acting her baseline.  She has not had any recent illnesses.  The friend was requesting that we check some blood work because she recently was admitted for a GI bleed.  She was previously on Eliquis for PE but with the recent GI bleed, she was taken off Eliquis and is no longer on anticoagulants per caretakers at bedside.  Her caretaker says that she thinks her tetanus shot is up-to-date but will check with her PCP.        Past Medical History:  Diagnosis Date  . Arthritis   . Cough    loose  started 05/05/15 no fever  . Dementia ([place])    STARTING SIGNS OF DEMENTIA  . Depression    TAKING ZOLOFT FOR A LONG TIME  . Headache(784.0)   . Hypertension   . Rheumatoid arthritis [facility])     Patient Active Problem List   Diagnosis Date Noted  . Anemia 02/19/2020  . Angiodysplasia of stomach 02/19/2020  . Advanced dementia ([place]) 02/19/2020  . Hypotension 02/11/2020  . Pulmonary embolism ([place]) 02/11/2020  . Pulmonary mass 02/11/2020  . Hypertension 02/11/2020  . GERD (gastroesophageal reflux disease) 02/11/2020  . Fracture, proximal femur ([place]) 12/19/2019  . History of total left hip arthroplasty 12/19/2019  . Radius distal fracture 12/19/2019  .  Pulmonary nodules/lesions, multiple 12/19/2019  . Acute respiratory failure with hypoxia ([place]) 12/19/2019  . Rheumatoid arthritis ([place]) 12/19/2019  . 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 Hyperlipidemia 12/19/2019  . Depression 12/19/2019  . HNP (herniated nucleus pulposus), cervical 05/10/2015    Past Surgical History:  Procedure Laterality Date  . CARPAL TUNNEL RELEASE     RIGHT  . EYE SURGERY     BILATERAL CATARACTS  . FRACTURE SURGERY     RIGHT HIP-HAS SCREW  . POSTERIOR CERVICAL FUSION/FORAMINOTOMY N/A 09/20/2013   Procedure: CERVICAL THREE TO CERVICAL SEVEN  POSTERIOR CERVICAL FUSION;  Surgeon: Elaina Hoops, MD;  Location: [place] [facility];  Service: Neurosurgery;  Laterality: N/A;  CERVICAL THREE TO CERVICAL SEVEN  POSTERIOR CERVICAL FUSION  . POSTERIOR CERVICAL FUSION/FORAMINOTOMY N/A 05/10/2015   Procedure: Posterior Cervical Laminectomy Diskectomy and Fusion Cervical seven Thoracic one. Exploration of Fusion Removal of Hardware Cervical three to seven;  Surgeon: Kary Kos, MD;  Location: [place] [facility];  Service: Neurosurgery;  Laterality: N/A;  . ROTATOR CUFF REPAIR     RIGHT SHOULDER  . SHOULDER ARTHROSCOPY     ? LEFT BONE SPURSS     OB History   No obstetric history on file.     History reviewed. No pertinent family history.  Social  History   Tobacco Use  . Smoking status: Current Every Day Smoker    Packs/day: 0.25    Years: 68.00    Pack years: 17.00    Types: Cigarettes  . Smokeless tobacco: Never Used  Substance Use Topics  . Alcohol use: No  . Drug use: No    Home Medications Prior to Admission medications   Medication Sig Start Date End Date Taking? Authorizing Provider  acetaminophen (TYLENOL) 325 MG tablet Take 650 mg by mouth every 6 (six) hours as needed (For Pain).    [provider]  albuterol (VENTOLIN HFA) 108 (90 Base) MCG/ACT inhaler Inhale 2 puffs into the lungs every 6 (six) hours as needed for wheezing or  shortness of breath. 03/03/20   Granville Lewis C, PA-C  amLODipine (NORVASC) 5 MG tablet Take 0.5 tablets (2.5 mg total) by mouth daily. 03/03/20   Granville Lewis C, PA-C  apixaban (ELIQUIS) 5 MG TABS tablet Take 1 tablet (5 mg total) by mouth 2 (two) times daily. 03/03/20   Granville Lewis C, PA-C  ascorbic acid (VITAMIN C) 500 MG tablet Take 1 tablet (500 mg total) by mouth daily. 01/07/20   Granville Lewis C, PA-C  atorvastatin (LIPITOR) 10 MG tablet Take 1 tablet (10 mg total) by mouth at bedtime. FOR HYPERCHOLESTEROLEMIA 03/03/20   Granville Lewis C, PA-C  buPROPion (WELLBUTRIN XL) 300 MG 24 hr tablet Take 1 tablet (300 mg total) by mouth at bedtime. 03/03/20   Granville Lewis C, PA-C  calcium carbonate (OS-CAL) 600 MG TABS tablet Take 600 mg by mouth daily with breakfast.     [provider]  donepezil (ARICEPT) 10 MG tablet Take 1 tablet (10 mg total) by mouth 2 (two) times daily. 03/03/20   Granville Lewis C, PA-C  Ensure (ENSURE) Take 237 mLs by mouth 2 (two) times daily. Lactose reduced    [provider]  ferrous sulfate 325 (65 FE) MG tablet Take 1 tablet (325 mg total) by mouth 2 (two) times daily with a meal. For Anemia 01/07/20   Granville Lewis C, PA-C  HYDROcodone-acetaminophen (NORCO/VICODIN) 5-325 MG tablet Take 1 tablet by mouth 2 (two) times daily. 03/17/20   Hennie Duos, MD  leflunomide (ARAVA) 20 MG tablet Take 1 tablet (20 mg total) by mouth daily. Take on M-W-F-Su 03/03/20   Wille Celeste, PA-C  memantine (NAMENDA XR) 28 MG CP24 24 hr capsule Take 1 capsule (28 mg total) by mouth daily. 03/03/20   Granville Lewis C, PA-C  mirtazapine (REMERON) 30 MG tablet Take 1 tablet (30 mg total) by mouth every evening. 03/03/20   Wille Celeste, PA-C  Multiple Vitamin (MULTIVITAMIN WITH MINERALS) TABS tablet Take 1 tablet by mouth daily.    [provider]  Nutritional Supplements (NUTRITIONAL SUPPLEMENT PO) Take 1 each by mouth in the morning and at bedtime. L Magic Cup with lunch and  dinner    [provider]  omeprazole (PRILOSEC) 20 MG capsule Take 1 capsule (20 mg total) by mouth daily. 03/03/20   Granville Lewis C, PA-C  potassium chloride SA (KLOR-CON) 20 MEQ tablet Take 1 tablet (20 mEq total) by mouth daily. 03/03/20   Wille Celeste, PA-C  predniSONE (DELTASONE) 5 MG tablet Take 1 tablet (5 mg total) by mouth daily with breakfast. Take on M-W-F-Sun 03/03/20   Wille Celeste, PA-C  senna-docusate (SENOKOT-S) 8.6-50 MG tablet Take 1 tablet by mouth every evening.    [provider]  sertraline (ZOLOFT) 100  MG tablet Take 1.5 tablets (150 mg total) by mouth daily. 03/03/20   Granville Lewis C, PA-C  sulfamethoxazole-trimethoprim (BACTRIM DS) 800-160 MG tablet TAKE 1 TABLET BY MOUTH EVERY MONDAY - WEDNESDAY - FRIDAY FOR PCP PROPHYLAXIS DUE TO CHRONIC STEROID USE 03/03/20   Granville Lewis C, PA-C  vitamin B-12 (CYANOCOBALAMIN) 1000 MCG tablet Take 1 tablet (1,000 mcg total) by mouth daily. 03/03/20   Wille Celeste, PA-C    Allergies    Methotrexate, Cymbalta [duloxetine hcl], and Duloxetine  Review of Systems   Review of Systems  Unable to perform ROS: Dementia    Physical Exam Updated Vital Signs BP 109/80 (BP Location: Left Arm)   Pulse 87   Temp 98.3 F (36.8 C) (Oral)   Resp 16   Ht 5\' 3"  (1.6 m)   Wt 54.4 kg   SpO2 95%   BMI 21.26 kg/m   Physical Exam Constitutional:      Appearance: She is well-developed.  HENT:     Head:     Comments: 3cm laceration to right forehead, no active bleeding Eyes:     Pupils: Pupils are equal, round, and reactive to light.  Neck:     Comments: No pain along the cervical, thoracic or lumbosacral spine Cardiovascular:     Rate and Rhythm: Normal rate and regular rhythm.     Heart sounds: Normal heart sounds.  Pulmonary:     Effort: Pulmonary effort is normal. No respiratory distress.     Breath sounds: Normal breath sounds. No wheezing or rales.  Chest:     Chest wall: No tenderness.  Abdominal:      General: Bowel sounds are normal.     Palpations: Abdomen is soft.     Tenderness: There is no abdominal tenderness. There is no guarding or rebound.  Musculoskeletal:        General: Normal range of motion.     Cervical back: Normal range of motion and neck supple.     Comments: No pain on range of motion or palpation of the extremities  Lymphadenopathy:     Cervical: No cervical adenopathy.  Skin:    General: Skin is warm and dry.     Findings: No rash.  Neurological:     Mental Status: She is alert.     Comments: Oriented to person and place only     ED Results / Procedures / Treatments   Labs (all labs ordered are listed, but only abnormal results are displayed) Labs Reviewed  BASIC METABOLIC PANEL - Abnormal; Notable for the following components:      Result Value   Glucose, Bld 101 (*)    Creatinine, Ser 1.47 (*)    GFR calc non Af Amer 32 (*)    GFR calc Af Amer 37 (*)    All other components within normal limits  CBC WITH DIFFERENTIAL/PLATELET - Abnormal; Notable for the following components:   RBC 3.68 (*)    Hemoglobin 9.0 (*)    HCT 30.8 (*)    MCH 24.5 (*)    MCHC 29.2 (*)    RDW 21.6 (*)    Platelets 407 (*)    All other components within normal limits  URINALYSIS, ROUTINE W REFLEX MICROSCOPIC - Abnormal; Notable for the following components:   Specific Gravity, Urine >1.030 (*)    Hgb urine dipstick TRACE (*)    All other components within normal limits  URINALYSIS, MICROSCOPIC (REFLEX) - Abnormal; Notable for the following components:  Bacteria, UA RARE (*)    All other components within normal limits  PATHOLOGIST SMEAR REVIEW    EKG None  Radiology CT Head Wo Contrast  Result Date: 06/26/2020 CLINICAL DATA:  Laceration, head trauma, minor. Additional provided: Fall with laceration to forehead. EXAM: CT HEAD WITHOUT CONTRAST TECHNIQUE: Contiguous axial images were obtained from the base of the skull through the vertex without intravenous contrast.  COMPARISON:  Prior head CT 12/04/2019 FINDINGS: Brain: Stable, mild generalized parenchymal atrophy. Redemonstrated small focus of chronic encephalomalacia within the anterior left temporal lobe. Redemonstrated small chronic lacunar infarcts within the basal ganglia. Stable background mild ill-defined hypoattenuation within the cerebral white matter which is nonspecific, but consistent with chronic small vessel ischemic disease. There is no acute intracranial hemorrhage. No demarcated cortical infarct. No extra-axial fluid collection. No evidence of intracranial mass. No midline shift. Vascular: No hyperdense vessel.  Atherosclerotic calcifications. Skull: Normal. Negative for fracture or focal lesion. Small bilateral mastoid Sinuses/Orbits: Visualized orbits show no acute finding. No significant paranasal sinus disease at the imaged levels. Small bilateral mastoid effusions. Other: Right forehead laceration IMPRESSION: No evidence of acute intracranial abnormality. Right forehead laceration. Redemonstrated small focus of chronic encephalomalacia within the anterior left temporal lobe. Stable mild generalized parenchymal atrophy and chronic small vessel ischemic disease. Redemonstrated chronic lacunar infarcts within the basal ganglia. Small bilateral mastoid effusions. Electronically Signed   By: Kellie Simmering DO   On: 06/26/2020 15:04   CT Cervical Spine Wo Contrast  Result Date: 06/26/2020 CLINICAL DATA:  Status post fall. EXAM: CT CERVICAL SPINE WITHOUT CONTRAST TECHNIQUE: Multidetector CT imaging of the cervical spine was performed without intravenous contrast. Multiplanar CT image reconstructions were also generated. COMPARISON:  March 02, 2020 FINDINGS: Alignment: Mild to moderate severity levoscoliosis is seen. Approximately 1 mm to 2 mm anterolisthesis of the C3 vertebral body is seen on C4. 2 mm anterolisthesis of the C7 vertebral body on T1 is also noted. Skull base and vertebrae: No acute fracture.  Bilateral metallic density pedicle screws are seen at the levels of C7 and T1. Postoperative changes are seen involving the posterior elements from the level of C3 through T1. Soft tissues and spinal canal: No prevertebral fluid or swelling. No visible canal hematoma. Disc levels: Mild to moderate severity multilevel endplate sclerosis is seen throughout the cervical spine. This is most prominent at the levels of C5-C6 and C6-C7. Marked severity intervertebral disc space narrowing is seen at the level of C5-C6, with moderate severity intervertebral disc space narrowing seen at the levels of C7-T1. Mild to moderate severity intervertebral disc space narrowing is seen throughout the remainder of the cervical spine. Marked severity multilevel bilateral facet joint hypertrophy is noted. Upper chest: Negative. Other: None. IMPRESSION: 1. No acute fracture within the cervical spine. 2. Postoperative changes involving the posterior elements from the level of C3 through T1. 3. Marked severity multilevel bilateral facet joint hypertrophy. 4. Approximately 1 mm to 2 mm anterolisthesis of the C3 vertebral body on C4 and C7 vertebral body on T1. Electronically Signed   By: Virgina Norfolk M.D.   On: 06/26/2020 15:42    Procedures .Marland KitchenLaceration Repair  Date/Time: 06/26/2020 3:57 PM Performed by: Malvin Johns, MD Authorized by: Malvin Johns, MD   Consent:    Consent obtained:  Verbal   Consent given by:  Guardian   Risks discussed:  Poor cosmetic result, poor wound healing and pain   Alternatives discussed:  No treatment Anesthesia (see MAR for exact dosages):  Anesthesia method:  Local infiltration   Local anesthetic:  Lidocaine 1% WITH epi Laceration details:    Location:  Face   Face location:  Forehead   Length (cm):  3 Repair type:    Repair type:  Simple Pre-procedure details:    Preparation:  Patient was prepped and draped in usual sterile fashion and imaging obtained to evaluate for foreign  bodies Exploration:    Hemostasis achieved with:  Epinephrine and direct pressure   Wound extent: no foreign bodies/material noted, no muscle damage noted, no underlying fracture noted and no vascular damage noted     Contaminated: no   Treatment:    Area cleansed with:  Saline   Amount of cleaning:  Standard   Irrigation solution:  Sterile water   Irrigation volume:  300   Irrigation method:  Syringe   Visualized foreign bodies/material removed: no   Skin repair:    Repair method:  Sutures   Suture size:  5-0   Suture material:  Prolene   Number of sutures:  6 Approximation:    Approximation:  Close Post-procedure details:    Dressing:  Antibiotic ointment   (including critical care time)  Medications Ordered in ED Medications  lidocaine-EPINEPHrine (XYLOCAINE-EPINEPHrine) 1 %-1:200000 (PF) injection 10 mL (10 mLs Intradermal Given by Other 06/26/20 1856)    ED Course  I have reviewed the triage vital signs and the nursing notes.  Pertinent labs & imaging results that were available during my care of the patient were reviewed by me and considered in my medical decision making (see chart for details).    MDM Rules/Calculators/A&P                          Patient is a 86 year old female who presents after a fall.  It sounds like it was a mechanical fall although the family wonders if she got a little dizzy which made her sit down too fast.  She is alert and at baseline mental status currently.  She is not have any complaints.  She does not have any neurologic deficits.  She had a CT scan of her head and cervical spine which showed no acute abnormalities.  Her labs show hemoglobin 9 which is better than her recent hospitalization.  Her creatinine is mildly elevated at 1.47.  Her other labs are nonconcerning.  Her urine does not have any indications of infection.  Her laceration was repaired.  The patient's friends who are caregivers are here at bedside.  Wound care instructions were  given and it was advised that she will need to have the sutures removed in 7 to 10 days.  They also advised that she should follow-up with her PCP within this week to have her blood work rechecked.  Return precautions were given.  They could not verify the patient's tetanus status so her tetanus shot was updated here in the ED. Final Clinical Impression(s) / ED Diagnoses Final diagnoses:  Injury of head, initial encounter  Fall, initial encounter  Laceration of other part of head without foreign body, initial encounter  Elevated serum creatinine  Anemia, unspecified type    Rx / DC Orders ED Discharge Orders    None       Malvin Johns, MD 06/26/20 Ilsa Iha    Malvin Johns, MD 06/26/20 1905

## 2020-06-26 NOTE — ED Notes (Signed)
PT reminded of the need for Urine specimen.

## 2020-06-28 LAB — PATHOLOGIST SMEAR REVIEW

## 2020-07-06 ENCOUNTER — Other Ambulatory Visit: Payer: Self-pay

## 2020-07-06 ENCOUNTER — Encounter (HOSPITAL_BASED_OUTPATIENT_CLINIC_OR_DEPARTMENT_OTHER): Payer: Self-pay

## 2020-07-06 ENCOUNTER — Emergency Department (HOSPITAL_BASED_OUTPATIENT_CLINIC_OR_DEPARTMENT_OTHER): Payer: Medicare HMO

## 2020-07-06 ENCOUNTER — Emergency Department (HOSPITAL_BASED_OUTPATIENT_CLINIC_OR_DEPARTMENT_OTHER)
Admission: EM | Admit: 2020-07-06 | Discharge: 2020-07-06 | Disposition: A | Discharge status: Home / Self Care | Payer: Medicare HMO | Attending: Emergency Medicine | Admitting: Emergency Medicine

## 2020-07-06 DIAGNOSIS — S42295A Other nondisplaced fracture of upper end of left humerus, initial encounter for closed fracture: Secondary | ICD-10-CM

## 2020-07-06 DIAGNOSIS — Y999 Unspecified external cause status: Secondary | ICD-10-CM | POA: Insufficient documentation

## 2020-07-06 DIAGNOSIS — S42202A Unspecified fracture of upper end of left humerus, initial encounter for closed fracture: Secondary | ICD-10-CM | POA: Insufficient documentation

## 2020-07-06 DIAGNOSIS — F0390 Unspecified dementia without behavioral disturbance: Secondary | ICD-10-CM | POA: Insufficient documentation

## 2020-07-06 DIAGNOSIS — F17210 Nicotine dependence, cigarettes, uncomplicated: Secondary | ICD-10-CM | POA: Insufficient documentation

## 2020-07-06 DIAGNOSIS — Z79899 Other long term (current) drug therapy: Secondary | ICD-10-CM | POA: Diagnosis not present

## 2020-07-06 DIAGNOSIS — Y939 Activity, unspecified: Secondary | ICD-10-CM | POA: Insufficient documentation

## 2020-07-06 DIAGNOSIS — I10 Essential (primary) hypertension: Secondary | ICD-10-CM | POA: Diagnosis not present

## 2020-07-06 DIAGNOSIS — W010XXA Fall on same level from slipping, tripping and stumbling without subsequent striking against object, initial encounter: Secondary | ICD-10-CM | POA: Insufficient documentation

## 2020-07-06 DIAGNOSIS — S4992XA Unspecified injury of left shoulder and upper arm, initial encounter: Secondary | ICD-10-CM | POA: Diagnosis present

## 2020-07-06 DIAGNOSIS — Y929 Unspecified place or not applicable: Secondary | ICD-10-CM | POA: Diagnosis not present

## 2020-07-06 DIAGNOSIS — W19XXXA Unspecified fall, initial encounter: Secondary | ICD-10-CM

## 2020-07-06 MED ORDER — METHOCARBAMOL 500 MG PO TABS
500.0000 mg | ORAL_TABLET | Freq: Every evening | ORAL | 0 refills | Status: DC
Start: 2020-07-06 — End: 2020-09-28

## 2020-07-06 MED ORDER — ACETAMINOPHEN 500 MG PO TABS
1000.0000 mg | ORAL_TABLET | Freq: Once | ORAL | Status: AC
  Administered 2020-07-06: 1000 mg via ORAL
  Filled 2020-07-06: qty 2

## 2020-07-06 NOTE — ED Notes (Signed)
ED Provider at bedside. 

## 2020-07-06 NOTE — Discharge Instructions (Addendum)
You have a fracture of the left shoulder.  Please follow-up with Dr. Len Childs.  Please take Tylenol he may use 1000 mg every 6 hours for pain.  Please drain plenty of water.  You may also use the Robaxin I prescribed you nightly for pain if you have difficulty sleeping.  This may increase her risk of fall.  Please use cautiously.  Please wear arm sling until you are seen by orthopedic doctor.

## 2020-07-06 NOTE — ED Triage Notes (Signed)
Pt tripped/fell ~12pm-pain to left UE-NAD-to triage in w/c-pt's POA with her

## 2020-07-06 NOTE — ED Provider Notes (Signed)
[place] EMERGENCY DEPARTMENT Provider Note   CSN: [id number] Arrival date & time: 07/06/20  1316     History Chief Complaint  Patient presents with  . Fall    Marcia Lynch is a 86 y.o. female.  HPI Patient is 86 year old female with history of dementia and relatively frequent falls presented today with her daughter at bedside who is POA.  Patient apparently tripped and fell approximately 12 PM has had left shoulder pain since that time.  She is not on blood thinners and denies any head injury or loss of consciousness.  She states that she does member the fall and states that she tripped over her feet.  She denies any chest pain, shortness breath, headache, lightheadedness, dizziness, weakness in either lower or upper extremities.  She has any confusion, altered mental status and her daughter at bedside states that she is at her baseline mental status.  Patient left shoulder pain is moderate 4/10 and absent when completely held still.  She states it is achy in character has taken nothing prior to arrival for pain.  She denies any numbness or weakness in her arm but states that she does not want to move it.    Past Medical History:  Diagnosis Date  . Arthritis   . Cough    loose  started 05/05/15 no fever  . Dementia ([place])    STARTING SIGNS OF DEMENTIA  . Depression    TAKING ZOLOFT FOR A LONG TIME  . Headache(784.0)   . Hypertension   . Rheumatoid arthritis [facility])     Patient Active Problem List   Diagnosis Date Noted  . Anemia 02/19/2020  . Angiodysplasia of stomach 02/19/2020  . Advanced dementia ([place]) 02/19/2020  . Hypotension 02/11/2020  . Pulmonary embolism ([place]) 02/11/2020  . Pulmonary mass 02/11/2020  . Hypertension 02/11/2020  . GERD (gastroesophageal reflux disease) 02/11/2020  . Fracture, proximal femur ([place]) 12/19/2019  . History of total left hip arthroplasty 12/19/2019  . Radius distal fracture 12/19/2019  . Pulmonary nodules/lesions,  multiple 12/19/2019  . Acute respiratory failure with hypoxia ([place]) 12/19/2019  . Rheumatoid arthritis ([place]) 12/19/2019  . 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 Hyperlipidemia 12/19/2019  . Depression 12/19/2019  . HNP (herniated nucleus pulposus), cervical 05/10/2015    Past Surgical History:  Procedure Laterality Date  . CARPAL TUNNEL RELEASE     RIGHT  . EYE SURGERY     BILATERAL CATARACTS  . FRACTURE SURGERY     RIGHT HIP-HAS SCREW  . POSTERIOR CERVICAL FUSION/FORAMINOTOMY N/A 09/20/2013   Procedure: CERVICAL THREE TO CERVICAL SEVEN  POSTERIOR CERVICAL FUSION;  Surgeon: Elaina Hoops, MD;  Location: [facility];  Service: Neurosurgery;  Laterality: N/A;  CERVICAL THREE TO CERVICAL SEVEN  POSTERIOR CERVICAL FUSION  . POSTERIOR CERVICAL FUSION/FORAMINOTOMY N/A 05/10/2015   Procedure: Posterior Cervical Laminectomy Diskectomy and Fusion Cervical seven Thoracic one. Exploration of Fusion Removal of Hardware Cervical three to seven;  Surgeon: Kary Kos, MD;  Location: [facility] [facility];  Service: Neurosurgery;  Laterality: N/A;  . ROTATOR CUFF REPAIR     RIGHT SHOULDER  . SHOULDER ARTHROSCOPY     ? LEFT BONE SPURSS     OB History   No obstetric history on file.     No family history on file.  Social History   Tobacco Use  . Smoking status: Current Every Day Smoker    Packs/day: 0.25    Years: 68.00    Pack years: 17.00  Types: Cigarettes  . Smokeless tobacco: Never Used  Substance Use Topics  . Alcohol use: No  . Drug use: No    Home Medications Prior to Admission medications   Medication Sig Start Date End Date Taking? Authorizing Provider  acetaminophen (TYLENOL) 325 MG tablet Take 650 mg by mouth every 6 (six) hours as needed (For Pain).    [provider]  albuterol (VENTOLIN HFA) 108 (90 Base) MCG/ACT inhaler Inhale 2 puffs into the lungs every 6 (six) hours as needed for wheezing or shortness of breath. 03/03/20   Granville Lewis C,  PA-C  amLODipine (NORVASC) 5 MG tablet Take 0.5 tablets (2.5 mg total) by mouth daily. 03/03/20   Granville Lewis C, PA-C  apixaban (ELIQUIS) 5 MG TABS tablet Take 1 tablet (5 mg total) by mouth 2 (two) times daily. 03/03/20   Granville Lewis C, PA-C  ascorbic acid (VITAMIN C) 500 MG tablet Take 1 tablet (500 mg total) by mouth daily. 01/07/20   Granville Lewis C, PA-C  atorvastatin (LIPITOR) 10 MG tablet Take 1 tablet (10 mg total) by mouth at bedtime. FOR HYPERCHOLESTEROLEMIA 03/03/20   Granville Lewis C, PA-C  buPROPion (WELLBUTRIN XL) 300 MG 24 hr tablet Take 1 tablet (300 mg total) by mouth at bedtime. 03/03/20   Granville Lewis C, PA-C  calcium carbonate (OS-CAL) 600 MG TABS tablet Take 600 mg by mouth daily with breakfast.     [provider]  donepezil (ARICEPT) 10 MG tablet Take 1 tablet (10 mg total) by mouth 2 (two) times daily. 03/03/20   Granville Lewis C, PA-C  Ensure (ENSURE) Take 237 mLs by mouth 2 (two) times daily. Lactose reduced    [provider]  ferrous sulfate 325 (65 FE) MG tablet Take 1 tablet (325 mg total) by mouth 2 (two) times daily with a meal. For Anemia 01/07/20   Granville Lewis C, PA-C  HYDROcodone-acetaminophen (NORCO/VICODIN) 5-325 MG tablet Take 1 tablet by mouth 2 (two) times daily. 03/17/20   Hennie Duos, MD  leflunomide (ARAVA) 20 MG tablet Take 1 tablet (20 mg total) by mouth daily. Take on M-W-F-Su 03/03/20   Wille Celeste, PA-C  memantine (NAMENDA XR) 28 MG CP24 24 hr capsule Take 1 capsule (28 mg total) by mouth daily. 03/03/20   Granville Lewis C, PA-C  methocarbamol (ROBAXIN) 500 MG tablet Take 1 tablet (500 mg total) by mouth at bedtime. This may cause dizziness fatigue and increase risk of falls.  Please use sparingly and cautiously. 07/06/20   Tedd Sias, PA  mirtazapine (REMERON) 30 MG tablet Take 1 tablet (30 mg total) by mouth every evening. 03/03/20   Wille Celeste, PA-C  Multiple Vitamin (MULTIVITAMIN WITH MINERALS) TABS tablet Take 1 tablet by mouth  daily.    [provider]  Nutritional Supplements (NUTRITIONAL SUPPLEMENT PO) Take 1 each by mouth in the morning and at bedtime. L Magic Cup with lunch and dinner    [provider]  omeprazole (PRILOSEC) 20 MG capsule Take 1 capsule (20 mg total) by mouth daily. 03/03/20   Granville Lewis C, PA-C  potassium chloride SA (KLOR-CON) 20 MEQ tablet Take 1 tablet (20 mEq total) by mouth daily. 03/03/20   Wille Celeste, PA-C  predniSONE (DELTASONE) 5 MG tablet Take 1 tablet (5 mg total) by mouth daily with breakfast. Take on M-W-F-Sun 03/03/20   Wille Celeste, PA-C  senna-docusate (SENOKOT-S) 8.6-50 MG tablet Take 1 tablet by mouth every evening.  [provider]  sertraline (ZOLOFT) 100 MG tablet Take 1.5 tablets (150 mg total) by mouth daily. 03/03/20   Granville Lewis C, PA-C  sulfamethoxazole-trimethoprim (BACTRIM DS) 800-160 MG tablet TAKE 1 TABLET BY MOUTH EVERY MONDAY - WEDNESDAY - FRIDAY FOR PCP PROPHYLAXIS DUE TO CHRONIC STEROID USE 03/03/20   Granville Lewis C, PA-C  vitamin B-12 (CYANOCOBALAMIN) 1000 MCG tablet Take 1 tablet (1,000 mcg total) by mouth daily. 03/03/20   Wille Celeste, PA-C    Allergies    Methotrexate, Cymbalta [duloxetine hcl], and Duloxetine  Review of Systems   Review of Systems  Constitutional: Negative for chills and fever.  HENT: Negative for congestion.   Eyes: Negative for pain.  Respiratory: Negative for cough and shortness of breath.   Cardiovascular: Negative for chest pain and leg swelling.  Gastrointestinal: Negative for abdominal distention, abdominal pain and vomiting.  Genitourinary: Negative for dysuria.  Musculoskeletal: Negative for myalgias.       Left sholuder pain   Skin: Negative for rash.  Neurological: Negative for dizziness and headaches.    Physical Exam Updated Vital Signs BP 129/79 (BP Location: Right Arm)   Pulse 84   Temp 98.1 F (36.7 C) (Oral)   Resp 16   Ht 5\' 3"  (1.6 m)   Wt 54.4 kg   SpO2 98%   BMI  21.26 kg/m   Physical Exam Vitals and nursing note reviewed.  Constitutional:      General: She is not in acute distress. HENT:     Head: Normocephalic and atraumatic.     Nose: Nose normal.     Mouth/Throat:     Mouth: Mucous membranes are moist.  Eyes:     General: No scleral icterus. Cardiovascular:     Rate and Rhythm: Normal rate and regular rhythm.     Pulses: Normal pulses.     Heart sounds: Normal heart sounds.  Pulmonary:     Effort: Pulmonary effort is normal. No respiratory distress.     Breath sounds: No wheezing.  Abdominal:     Palpations: Abdomen is soft.     Tenderness: There is no abdominal tenderness.  Musculoskeletal:     Cervical back: Normal range of motion.     Right lower leg: No edema.     Left lower leg: No edema.     Comments: Bilateral grips 5/5.  Range of motion not evaluated in the left shoulder given the fracture.  Bilateral radial pulses 3+ and symmetric.  Skin:    General: Skin is warm and dry.     Capillary Refill: Capillary refill takes less than 2 seconds.  Neurological:     Mental Status: She is alert. Mental status is at baseline.     Comments: Alert and oriented to self, place, time and event.   Speech is fluent, clear without dysarthria or dysphasia.   Strength 5/5 in upper/lower extremities (left upper extremity strength only tested in grip which is 5/5) Sensation intact in upper/lower extremities   Gait at baseline -- hesitant and requiring support Negative Romberg. No pronator drift.  Normal finger-to-nose and feet tapping.  CN I not tested  CN II grossly intact visual fields bilaterally. Did not visualize posterior eye.   CN III, IV, VI PERRLA and EOMs intact bilaterally  CN V Intact sensation to sharp and light touch to the face  CN VII facial movements symmetric  CN VIII not tested  CN IX, X no uvula deviation, symmetric rise of soft palate  CN XI 5/5 SCM and trapezius strength bilaterally  CN XII Midline tongue  protrusion, symmetric L/R movements   Psychiatric:        Mood and Affect: Mood normal.        Behavior: Behavior normal.     ED Results / Procedures / Treatments   Labs (all labs ordered are listed, but only abnormal results are displayed) Labs Reviewed - No data to display  EKG None  Radiology DG Shoulder Left  Result Date: 07/06/2020 CLINICAL DATA:  Fall today. EXAM: LEFT SHOULDER - 2+ VIEW COMPARISON:  None. FINDINGS: Status post left shoulder arthroplasty. Comminuted fracture involving the humeral head and anatomic neck. Osteopenia. IMPRESSION: Comminuted proximal humerus fracture in the setting of prior total shoulder arthroplasty. Electronically Signed   By: Abigail Miyamoto M.D.   On: 07/06/2020 14:14    Procedures Procedures (including critical care time)  Medications Ordered in ED Medications  acetaminophen (TYLENOL) tablet 1,000 mg (1,000 mg Oral Given 07/06/20 1505)    ED Course  I have reviewed the triage vital signs and the nursing notes.  Pertinent labs & imaging results that were available during my care of the patient were reviewed by me and considered in my medical decision making (see chart for details).  Clinical Course as of Jul 07 1426  Thu Jul 06, 2020  1407 DG Shoulder Left [AS]    Clinical Course User Index [AS] Wallie Char   MDM Rules/Calculators/A&P                          Patient is 86 year old female with dementia who suffered a mechanical fall this morning.  Physical exam notable for difficulties of left shoulder, intact bilateral radial pulses, good cap refill, good sensation distally  Plans to send home with orthopedic surgery follow-up and pain control and sling.  I discussed this case with my attending physician who cosigned this note including patient's presenting symptoms, physical exam, and planned diagnostics and interventions. Attending physician stated agreement with plan or made changes to plan which were  implemented.   Attending physician assessed patient at bedside.  Discharge home with Tylenol for pain control.  Because of age I believe she will benefit from Tylenol primarily instead of ibuprofen.  I did provide the daughter with a written prescription for Robaxin that she may use at bedtime the patient is having pain that prevents her from sleeping.  Patient was placed in a sling.  She remains distally neurovascularly intact in her arm fingertips she has good pulses in her extremities.  She has a orthopedic doctor who she follows with because of fracture she has had in the past.  She will follow-up with their office she will call tomorrow morning to make an appointment.  She is given return precautions.  She is well-appearing discharge at her baseline.  Final Clinical Impression(s) / ED Diagnoses Final diagnoses:  Fall, initial encounter  Other closed nondisplaced fracture of proximal end of left humerus, initial encounter    Rx / DC Orders ED Discharge Orders         Ordered    methocarbamol (ROBAXIN) 500 MG tablet  Nightly     Discontinue  Reprint     07/06/20 [place], Delsy Etzkorn S, [place] 07/07/20 1427    Truddie Hidden, MD 07/09/20 1451

## 2020-09-22 ENCOUNTER — Emergency Department (HOSPITAL_BASED_OUTPATIENT_CLINIC_OR_DEPARTMENT_OTHER): Payer: Medicare HMO

## 2020-09-22 ENCOUNTER — Encounter (HOSPITAL_BASED_OUTPATIENT_CLINIC_OR_DEPARTMENT_OTHER): Payer: Self-pay | Admitting: Emergency Medicine

## 2020-09-22 ENCOUNTER — Inpatient Hospital Stay (HOSPITAL_BASED_OUTPATIENT_CLINIC_OR_DEPARTMENT_OTHER)
Admission: EM | Admit: 2020-09-22 | Discharge: 2020-09-27 | DRG: 177 | Disposition: A | Discharge status: Skilled Nursing Facility | Payer: Medicare HMO | Attending: Internal Medicine | Admitting: Internal Medicine

## 2020-09-22 ENCOUNTER — Other Ambulatory Visit: Payer: Self-pay

## 2020-09-22 DIAGNOSIS — G309 Alzheimer's disease, unspecified: Secondary | ICD-10-CM | POA: Diagnosis present

## 2020-09-22 DIAGNOSIS — F03C0 Unspecified dementia, severe, without behavioral disturbance, psychotic disturbance, mood disturbance, and anxiety: Secondary | ICD-10-CM | POA: Diagnosis present

## 2020-09-22 DIAGNOSIS — Z7901 Long term (current) use of anticoagulants: Secondary | ICD-10-CM

## 2020-09-22 DIAGNOSIS — Z681 Body mass index (BMI) 19 or less, adult: Secondary | ICD-10-CM

## 2020-09-22 DIAGNOSIS — U071 COVID-19: Principal | ICD-10-CM | POA: Diagnosis present

## 2020-09-22 DIAGNOSIS — I10 Essential (primary) hypertension: Secondary | ICD-10-CM | POA: Diagnosis present

## 2020-09-22 DIAGNOSIS — Z79899 Other long term (current) drug therapy: Secondary | ICD-10-CM

## 2020-09-22 DIAGNOSIS — E43 Unspecified severe protein-calorie malnutrition: Secondary | ICD-10-CM | POA: Diagnosis present

## 2020-09-22 DIAGNOSIS — R410 Disorientation, unspecified: Secondary | ICD-10-CM

## 2020-09-22 DIAGNOSIS — Z8249 Family history of ischemic heart disease and other diseases of the circulatory system: Secondary | ICD-10-CM

## 2020-09-22 DIAGNOSIS — Z888 Allergy status to other drugs, medicaments and biological substances status: Secondary | ICD-10-CM

## 2020-09-22 DIAGNOSIS — R2681 Unsteadiness on feet: Secondary | ICD-10-CM

## 2020-09-22 DIAGNOSIS — D649 Anemia, unspecified: Secondary | ICD-10-CM | POA: Diagnosis present

## 2020-09-22 DIAGNOSIS — Z9841 Cataract extraction status, right eye: Secondary | ICD-10-CM

## 2020-09-22 DIAGNOSIS — Z9842 Cataract extraction status, left eye: Secondary | ICD-10-CM

## 2020-09-22 DIAGNOSIS — F0390 Unspecified dementia without behavioral disturbance: Secondary | ICD-10-CM | POA: Diagnosis present

## 2020-09-22 DIAGNOSIS — Z79891 Long term (current) use of opiate analgesic: Secondary | ICD-10-CM

## 2020-09-22 DIAGNOSIS — Z83511 Family history of glaucoma: Secondary | ICD-10-CM

## 2020-09-22 DIAGNOSIS — Z86711 Personal history of pulmonary embolism: Secondary | ICD-10-CM

## 2020-09-22 DIAGNOSIS — E2740 Unspecified adrenocortical insufficiency: Secondary | ICD-10-CM | POA: Diagnosis present

## 2020-09-22 DIAGNOSIS — Z66 Do not resuscitate: Secondary | ICD-10-CM | POA: Diagnosis not present

## 2020-09-22 DIAGNOSIS — C3411 Malignant neoplasm of upper lobe, right bronchus or lung: Secondary | ICD-10-CM | POA: Diagnosis present

## 2020-09-22 DIAGNOSIS — Z833 Family history of diabetes mellitus: Secondary | ICD-10-CM

## 2020-09-22 DIAGNOSIS — Z981 Arthrodesis status: Secondary | ICD-10-CM

## 2020-09-22 DIAGNOSIS — Z7952 Long term (current) use of systemic steroids: Secondary | ICD-10-CM

## 2020-09-22 DIAGNOSIS — R918 Other nonspecific abnormal finding of lung field: Secondary | ICD-10-CM | POA: Diagnosis present

## 2020-09-22 DIAGNOSIS — R296 Repeated falls: Secondary | ICD-10-CM | POA: Diagnosis present

## 2020-09-22 DIAGNOSIS — I959 Hypotension, unspecified: Secondary | ICD-10-CM | POA: Diagnosis present

## 2020-09-22 DIAGNOSIS — M6281 Muscle weakness (generalized): Secondary | ICD-10-CM | POA: Diagnosis present

## 2020-09-22 DIAGNOSIS — F17210 Nicotine dependence, cigarettes, uncomplicated: Secondary | ICD-10-CM | POA: Diagnosis present

## 2020-09-22 DIAGNOSIS — R531 Weakness: Secondary | ICD-10-CM

## 2020-09-22 DIAGNOSIS — M069 Rheumatoid arthritis, unspecified: Secondary | ICD-10-CM | POA: Diagnosis present

## 2020-09-22 DIAGNOSIS — F0280 Dementia in other diseases classified elsewhere without behavioral disturbance: Secondary | ICD-10-CM | POA: Diagnosis present

## 2020-09-22 HISTORY — DX: Other pulmonary embolism without acute cor pulmonale: I26.99

## 2020-09-22 HISTORY — DX: Other nonspecific abnormal finding of lung field: R91.8

## 2020-09-22 LAB — COMPREHENSIVE METABOLIC PANEL
ALT: 13 U/L (ref 0–44)
AST: 22 U/L (ref 15–41)
Albumin: 3.3 g/dL — ABNORMAL LOW (ref 3.5–5.0)
Alkaline Phosphatase: 98 U/L (ref 38–126)
Anion gap: 9 (ref 5–15)
BUN: 12 mg/dL (ref 8–23)
CO2: 25 mmol/L (ref 22–32)
Calcium: 8.7 mg/dL — ABNORMAL LOW (ref 8.9–10.3)
Chloride: 97 mmol/L — ABNORMAL LOW (ref 98–111)
Creatinine, Ser: 1.45 mg/dL — ABNORMAL HIGH (ref 0.44–1.00)
GFR calc Af Amer: 38 mL/min — ABNORMAL LOW (ref >60)
GFR calc non Af Amer: 33 mL/min — ABNORMAL LOW (ref >60)
Glucose, Bld: 84 mg/dL (ref 70–99)
Potassium: 4 mmol/L (ref 3.5–5.1)
Sodium: 131 mmol/L — ABNORMAL LOW (ref 135–145)
Total Bilirubin: 0.5 mg/dL (ref 0.3–1.2)
Total Protein: 6.2 g/dL — ABNORMAL LOW (ref 6.5–8.1)

## 2020-09-22 LAB — CBC WITH DIFFERENTIAL/PLATELET
Abs Immature Granulocytes: 0.13 10*3/uL — ABNORMAL HIGH (ref 0.00–0.07)
Basophils Absolute: 0 10*3/uL (ref 0.0–0.1)
Basophils Relative: 0 %
Eosinophils Absolute: 0 10*3/uL (ref 0.0–0.5)
Eosinophils Relative: 0 %
HCT: 29.6 % — ABNORMAL LOW (ref 36.0–46.0)
Hemoglobin: 8.8 g/dL — ABNORMAL LOW (ref 12.0–15.0)
Immature Granulocytes: 1 %
Lymphocytes Relative: 9 %
Lymphs Abs: 0.8 10*3/uL (ref 0.7–4.0)
MCH: 24.8 pg — ABNORMAL LOW (ref 26.0–34.0)
MCHC: 29.7 g/dL — ABNORMAL LOW (ref 30.0–36.0)
MCV: 83.4 fL (ref 80.0–100.0)
Monocytes Absolute: 0.9 10*3/uL (ref 0.1–1.0)
Monocytes Relative: 10 %
Neutro Abs: 7.4 10*3/uL (ref 1.7–7.7)
Neutrophils Relative %: 80 %
Platelets: 339 10*3/uL (ref 150–400)
RBC: 3.55 MIL/uL — ABNORMAL LOW (ref 3.87–5.11)
RDW: 18.5 % — ABNORMAL HIGH (ref 11.5–15.5)
WBC: 9.3 10*3/uL (ref 4.0–10.5)
nRBC: 0 % (ref 0.0–0.2)

## 2020-09-22 LAB — RESPIRATORY PANEL BY RT PCR (FLU A&B, COVID)
Influenza A by PCR: NEGATIVE
Influenza B by PCR: NEGATIVE
SARS Coronavirus 2 by RT PCR: POSITIVE — AB

## 2020-09-22 LAB — URINALYSIS, ROUTINE W REFLEX MICROSCOPIC
Bilirubin Urine: NEGATIVE
Glucose, UA: NEGATIVE mg/dL
Hgb urine dipstick: NEGATIVE
Ketones, ur: NEGATIVE mg/dL
Leukocytes,Ua: NEGATIVE
Nitrite: NEGATIVE
Protein, ur: NEGATIVE mg/dL
Specific Gravity, Urine: 1.02 (ref 1.005–1.030)
pH: 7 (ref 5.0–8.0)

## 2020-09-22 LAB — OCCULT BLOOD X 1 CARD TO LAB, STOOL: Fecal Occult Bld: NEGATIVE

## 2020-09-22 MED ORDER — APIXABAN 2.5 MG PO TABS: 5.0000 mg | ORAL_TABLET | Freq: Two times a day (BID) | ORAL | Status: DC

## 2020-09-22 MED ORDER — SODIUM CHLORIDE 0.9 % IV SOLN
100.0000 mg | Freq: Once | INTRAVENOUS | Status: AC
  Administered 2020-09-22: 100 mg via INTRAVENOUS
  Filled 2020-09-22: qty 20

## 2020-09-22 MED ORDER — ALBUTEROL SULFATE HFA 108 (90 BASE) MCG/ACT IN AERS
2.0000 | INHALATION_SPRAY | Freq: Once | RESPIRATORY_TRACT | Status: AC
  Administered 2020-09-22: 2 via RESPIRATORY_TRACT
  Filled 2020-09-22: qty 6.7

## 2020-09-22 MED ORDER — BUPROPION HCL ER (XL) 300 MG PO TB24
300.0000 mg | ORAL_TABLET | Freq: Every day | ORAL | Status: DC
  Administered 2020-09-24 – 2020-09-27 (×5): 300 mg via ORAL
  Filled 2020-09-22 (×6): qty 1

## 2020-09-22 MED ORDER — MEMANTINE HCL ER 28 MG PO CP24
28.0000 mg | ORAL_CAPSULE | Freq: Every day | ORAL | Status: DC
  Administered 2020-09-24 – 2020-09-27 (×4): 28 mg via ORAL
  Filled 2020-09-22 (×5): qty 1

## 2020-09-22 MED ORDER — MIRTAZAPINE 30 MG PO TABS
30.0000 mg | ORAL_TABLET | Freq: Every evening | ORAL | Status: DC
  Filled 2020-09-22: qty 1

## 2020-09-22 MED ORDER — DONEPEZIL HCL 10 MG PO TABS
10.0000 mg | ORAL_TABLET | Freq: Two times a day (BID) | ORAL | Status: DC
  Administered 2020-09-24 – 2020-09-27 (×9): 10 mg via ORAL
  Filled 2020-09-22 (×10): qty 1

## 2020-09-22 MED ORDER — SODIUM CHLORIDE 0.9 % IV SOLN
100.0000 mg | Freq: Every day | INTRAVENOUS | Status: AC
  Administered 2020-09-23 – 2020-09-26 (×4): 100 mg via INTRAVENOUS
  Filled 2020-09-22 (×4): qty 20

## 2020-09-22 MED ORDER — SODIUM CHLORIDE 0.9 % IV BOLUS
500.0000 mL | Freq: Once | INTRAVENOUS | Status: AC
  Administered 2020-09-22: 500 mL via INTRAVENOUS

## 2020-09-22 MED ORDER — SERTRALINE HCL 25 MG PO TABS: 150.0000 mg | ORAL_TABLET | Freq: Every day | ORAL | Status: DC

## 2020-09-22 MED ORDER — AMLODIPINE BESYLATE 5 MG PO TABS
2.5000 mg | ORAL_TABLET | Freq: Every day | ORAL | Status: DC
  Administered 2020-09-22 – 2020-09-24 (×2): 2.5 mg via ORAL
  Filled 2020-09-22 (×2): qty 1

## 2020-09-22 NOTE — ED Provider Notes (Signed)
Signout from Dr. Marga Hoots.  86 year old female with dementia here after multiple falls yesterday and increased confusion.  Work-up has been fairly unremarkable.  She unfortunately was unable to ambulate safely in the department.  She will need admission to the hospital for further evaluation. Physical Exam  BP 137/83   Pulse 82   Temp 98.9 F (37.2 C) (Oral)   Resp (!) 21   Ht 5\' 4"  (1.626 m)   Wt 47.6 kg   SpO2 96%   BMI 18.02 kg/m   Physical Exam  ED Course/Procedures     Procedures  MDM  Discussed with [facility] hospitalist Dr. Sabino Gasser who will put the patient in for a bed at [facility] telemetry.  Patient ultimately tested positive for Covid.  Discussed with Dr. Lynetta Mare.hospitalist who recommends starting the patient on remdesivir.       Hayden Rasmussen, MD 09/23/20 1108

## 2020-09-22 NOTE — ED Notes (Signed)
Attempted UA on Pt.  Took Pt. To restroom on steady and she used the toilet with hat in place.  Pt. Moved away from hat and missed using toilet instead of hat.  Pt. Did not obtain specimen as needed.

## 2020-09-22 NOTE — ED Notes (Signed)
Pt. Friend / care taker just left approx. 20 mins ago and the Pt.is resting well.

## 2020-09-22 NOTE — ED Triage Notes (Addendum)
Per her caregiver patient has been more confused and had three falls during the night.  Does endorses urinary frequency which is her normal but denies any other urinary symptoms.  Reports "she just doesn't feel right".  POA does report patient received her flu vaccine yesterday.

## 2020-09-22 NOTE — ED Notes (Signed)
Pt. Has had food ... she was fed by RT Crystal a sandwich, nabs and coke.

## 2020-09-22 NOTE — ED Provider Notes (Signed)
[facility] EMERGENCY DEPARTMENT Provider Note   CSN: [id number] Arrival date & time: 09/22/20  3875     History Chief Complaint  Patient presents with  . Fall  . Altered Mental Status    Marcia Lynch is a 86 y.o. female.  Patient is a 86 year old female with a history of rheumatoid arthritis, hypertension, dementia who presents with weakness and falls.  She lives with her daughter who states that she has been slightly more confused during the night and is also had 3 falls.  She said last night that she did not quite feel well but she could not elaborate.  She denies any pain.  However her abdomen is more distended than it normally is.  She feels unsteady when she is walking.  She normally walks with a walker but has had to have more assistance during the night.  She denies any injuries from the fall.  She denies any head injury from the fall.  Her daughter assist in getting better history.  There is been no recent fevers.  No vomiting or diarrhea.  No new urinary symptoms.  No recent head injuries.  She has been previously diagnosed with a lung mass.  This was seen on imaging studies but no further treatment or diagnoses was pursued given the fact that the family would not want to pursue cancer treatment if it was found to be that.  The daughter is worried because her abdomen seems pretty distended and is worried that maybe the cancer has spread.  Most of the history is obtained from the daughter as patient has limited capacity to provide history given her dementia.        Past Medical History:  Diagnosis Date  . Arthritis   . Cough    loose  started 05/05/15 no fever  . Dementia ([place])    STARTING SIGNS OF DEMENTIA  . Depression    TAKING ZOLOFT FOR A LONG TIME  . Headache(784.0)   . Hypertension   . Rheumatoid arthritis [facility])     Patient Active Problem List   Diagnosis Date Noted  . Anemia 02/19/2020  . Angiodysplasia of stomach 02/19/2020  . Advanced dementia  ([place]) 02/19/2020  . Hypotension 02/11/2020  . Pulmonary embolism ([place]) 02/11/2020  . Pulmonary mass 02/11/2020  . Hypertension 02/11/2020  . GERD (gastroesophageal reflux disease) 02/11/2020  . Fracture, proximal femur ([place]) 12/19/2019  . History of total left hip arthroplasty 12/19/2019  . Radius distal fracture 12/19/2019  . Pulmonary nodules/lesions, multiple 12/19/2019  . Acute respiratory failure with hypoxia ([place]) 12/19/2019  . Rheumatoid arthritis ([place]) 12/19/2019  . 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 Hyperlipidemia 12/19/2019  . Depression 12/19/2019  . HNP (herniated nucleus pulposus), cervical 05/10/2015    Past Surgical History:  Procedure Laterality Date  . CARPAL TUNNEL RELEASE     RIGHT  . EYE SURGERY     BILATERAL CATARACTS  . FRACTURE SURGERY     RIGHT HIP-HAS SCREW  . POSTERIOR CERVICAL FUSION/FORAMINOTOMY N/A 09/20/2013   Procedure: CERVICAL THREE TO CERVICAL SEVEN  POSTERIOR CERVICAL FUSION;  Surgeon: Elaina Hoops, MD;  Location: [facility];  Service: Neurosurgery;  Laterality: N/A;  CERVICAL THREE TO CERVICAL SEVEN  POSTERIOR CERVICAL FUSION  . POSTERIOR CERVICAL FUSION/FORAMINOTOMY N/A 05/10/2015   Procedure: Posterior Cervical Laminectomy Diskectomy and Fusion Cervical seven Thoracic one. Exploration of Fusion Removal of Hardware Cervical three to seven;  Surgeon: Kary Kos, MD;  Location: [facility];  Service:  Neurosurgery;  Laterality: N/A;  . ROTATOR CUFF REPAIR     RIGHT SHOULDER  . SHOULDER ARTHROSCOPY     ? LEFT BONE SPURSS     OB History   No obstetric history on file.     No family history on file.  Social History   Tobacco Use  . Smoking status: Current Every Day Smoker    Packs/day: 0.25    Years: 68.00    Pack years: 17.00    Types: Cigarettes  . Smokeless tobacco: Never Used  Substance Use Topics  . Alcohol use: No  . Drug use: No    Home Medications Prior to Admission medications   Medication  Sig Start Date End Date Taking? Authorizing Provider  acetaminophen (TYLENOL) 325 MG tablet Take 650 mg by mouth every 6 (six) hours as needed (For Pain).    [provider]  albuterol (VENTOLIN HFA) 108 (90 Base) MCG/ACT inhaler Inhale 2 puffs into the lungs every 6 (six) hours as needed for wheezing or shortness of breath. 03/03/20   Granville Lewis C, PA-C  amLODipine (NORVASC) 5 MG tablet Take 0.5 tablets (2.5 mg total) by mouth daily. 03/03/20   Granville Lewis C, PA-C  apixaban (ELIQUIS) 5 MG TABS tablet Take 1 tablet (5 mg total) by mouth 2 (two) times daily. 03/03/20   Granville Lewis C, PA-C  ascorbic acid (VITAMIN C) 500 MG tablet Take 1 tablet (500 mg total) by mouth daily. 01/07/20   Granville Lewis C, PA-C  atorvastatin (LIPITOR) 10 MG tablet Take 1 tablet (10 mg total) by mouth at bedtime. FOR HYPERCHOLESTEROLEMIA 03/03/20   Granville Lewis C, PA-C  buPROPion (WELLBUTRIN XL) 300 MG 24 hr tablet Take 1 tablet (300 mg total) by mouth at bedtime. 03/03/20   Granville Lewis C, PA-C  calcium carbonate (OS-CAL) 600 MG TABS tablet Take 600 mg by mouth daily with breakfast.     [provider]  donepezil (ARICEPT) 10 MG tablet Take 1 tablet (10 mg total) by mouth 2 (two) times daily. 03/03/20   Granville Lewis C, PA-C  Ensure (ENSURE) Take 237 mLs by mouth 2 (two) times daily. Lactose reduced    [provider]  ferrous sulfate 325 (65 FE) MG tablet Take 1 tablet (325 mg total) by mouth 2 (two) times daily with a meal. For Anemia 01/07/20   Granville Lewis C, PA-C  HYDROcodone-acetaminophen (NORCO/VICODIN) 5-325 MG tablet Take 1 tablet by mouth 2 (two) times daily. 03/17/20   Hennie Duos, MD  leflunomide (ARAVA) 20 MG tablet Take 1 tablet (20 mg total) by mouth daily. Take on M-W-F-Su 03/03/20   Wille Celeste, PA-C  memantine (NAMENDA XR) 28 MG CP24 24 hr capsule Take 1 capsule (28 mg total) by mouth daily. 03/03/20   Granville Lewis C, PA-C  methocarbamol (ROBAXIN) 500 MG tablet Take 1 tablet (500  mg total) by mouth at bedtime. This may cause dizziness fatigue and increase risk of falls.  Please use sparingly and cautiously. 07/06/20   Tedd Sias, PA  mirtazapine (REMERON) 30 MG tablet Take 1 tablet (30 mg total) by mouth every evening. 03/03/20   Wille Celeste, PA-C  Multiple Vitamin (MULTIVITAMIN WITH MINERALS) TABS tablet Take 1 tablet by mouth daily.    [provider]  Nutritional Supplements (NUTRITIONAL SUPPLEMENT PO) Take 1 each by mouth in the morning and at bedtime. L Magic Cup with lunch and dinner    [provider]  omeprazole (PRILOSEC) 20 MG capsule  Take 1 capsule (20 mg total) by mouth daily. 03/03/20   Granville Lewis C, PA-C  potassium chloride SA (KLOR-CON) 20 MEQ tablet Take 1 tablet (20 mEq total) by mouth daily. 03/03/20   Wille Celeste, PA-C  predniSONE (DELTASONE) 5 MG tablet Take 1 tablet (5 mg total) by mouth daily with breakfast. Take on M-W-F-Sun 03/03/20   Wille Celeste, PA-C  senna-docusate (SENOKOT-S) 8.6-50 MG tablet Take 1 tablet by mouth every evening.    [provider]  sertraline (ZOLOFT) 100 MG tablet Take 1.5 tablets (150 mg total) by mouth daily. 03/03/20   Granville Lewis C, PA-C  sulfamethoxazole-trimethoprim (BACTRIM DS) 800-160 MG tablet TAKE 1 TABLET BY MOUTH EVERY MONDAY - WEDNESDAY - FRIDAY FOR PCP PROPHYLAXIS DUE TO CHRONIC STEROID USE 03/03/20   Granville Lewis C, PA-C  vitamin B-12 (CYANOCOBALAMIN) 1000 MCG tablet Take 1 tablet (1,000 mcg total) by mouth daily. 03/03/20   Wille Celeste, PA-C    Allergies    Methotrexate, Cymbalta [duloxetine hcl], and Duloxetine  Review of Systems   Review of Systems  Unable to perform ROS: Dementia    Physical Exam Updated Vital Signs BP 137/83   Pulse 82   Temp 98.9 F (37.2 C) (Oral)   Resp (!) 21   Ht 5\' 4"  (1.626 m)   Wt 47.6 kg   SpO2 96%   BMI 18.02 kg/m   Physical Exam Constitutional:      Appearance: She is well-developed.  HENT:     Head: Normocephalic and  atraumatic.  Eyes:     Pupils: Pupils are equal, round, and reactive to light.  Cardiovascular:     Rate and Rhythm: Normal rate and regular rhythm.     Heart sounds: Normal heart sounds.  Pulmonary:     Effort: Pulmonary effort is normal. No respiratory distress.     Breath sounds: Normal breath sounds. No wheezing or rales.  Chest:     Chest wall: No tenderness.  Abdominal:     General: Bowel sounds are normal. There is distension.     Palpations: Abdomen is soft.     Tenderness: There is abdominal tenderness (right mid and lower abd). There is no guarding or rebound.  Musculoskeletal:        General: Normal range of motion.     Cervical back: Normal range of motion and neck supple.  Lymphadenopathy:     Cervical: No cervical adenopathy.  Skin:    General: Skin is warm and dry.     Findings: No rash.  Neurological:     Mental Status: She is alert and oriented to person, place, and time.     Comments: Pt is oriented to person and place only.  This is baseline per daughter. Motor 5 out of 5 in all extremities although she has 4 out of 5 in the left lower extremity.  She is had a prior hip replacement and its unclear whether this weakness is due to her hip replacement or true weakness.  She is able to raise the leg off the bed but it does migrate toward the left and she is not able to hold it up very long.  She has normal sensation to light touch in all extremities.  No facial drooping.  No other obvious cranial nerve deficits.  Exam is slightly limited due to her dementia.     ED Results / Procedures / Treatments   Labs (all labs ordered are listed, but only abnormal results are  displayed) Labs Reviewed  COMPREHENSIVE METABOLIC PANEL - Abnormal; Notable for the following components:      Result Value   Sodium 131 (*)    Chloride 97 (*)    Creatinine, Ser 1.45 (*)    Calcium 8.7 (*)    Total Protein 6.2 (*)    Albumin 3.3 (*)    GFR calc non Af Amer 33 (*)    GFR calc Af  Amer 38 (*)    All other components within normal limits  CBC WITH DIFFERENTIAL/PLATELET - Abnormal; Notable for the following components:   RBC 3.55 (*)    Hemoglobin 8.8 (*)    HCT 29.6 (*)    MCH 24.8 (*)    MCHC 29.7 (*)    RDW 18.5 (*)    Abs Immature Granulocytes 0.13 (*)    All other components within normal limits  URINE CULTURE  RESPIRATORY PANEL BY RT PCR (FLU A&B, COVID)  URINALYSIS, ROUTINE W REFLEX MICROSCOPIC  OCCULT BLOOD X 1 CARD TO LAB, STOOL    EKG EKG Interpretation  Date/Time:  Friday September 22 2020 12:07:00 EDT Ventricular Rate:  84 PR Interval:    QRS Duration: 91 QT Interval:  376 QTC Calculation: 445 R Axis:   32 Text Interpretation: Sinus rhythm since last tracing no significant change Confirmed by Malvin Johns [phone]) on 09/22/2020 12:28:37 PM   Radiology CT ABDOMEN PELVIS WO CONTRAST  Result Date: 09/22/2020 CLINICAL DATA:  Acute abdominal pain. EXAM: CT ABDOMEN AND PELVIS WITHOUT CONTRAST TECHNIQUE: Multidetector CT imaging of the abdomen and pelvis was performed following the standard protocol without IV contrast. COMPARISON:  March 09, 2020. FINDINGS: Lower chest: No acute abnormality. Hepatobiliary: Cholelithiasis is noted without biliary dilatation. The liver is unremarkable. Pancreas: Grossly stable 1.8 cm hypodense area seen in pancreatic head. No pancreatic ductal dilatation or surrounding inflammatory changes. Spleen: Stable splenic cyst. Adrenals/Urinary Tract: Adrenal glands appear normal. Stable left renal cysts are noted. No hydronephrosis or renal obstruction is noted. No renal or ureteral calculi are noted. Urinary bladder somewhat obscured due to scatter artifact arising from left hip prosthesis. Visualized portion is unremarkable. Stomach/Bowel: Stomach is within normal limits. Appendix appears normal. No evidence of bowel wall thickening, distention, or inflammatory changes. Vascular/Lymphatic: Aortic atherosclerosis. No enlarged  abdominal or pelvic lymph nodes. Reproductive: No definite mass or other abnormality is noted. Other: No abdominal wall hernia or abnormality. No abdominopelvic ascites. Musculoskeletal: As noted above, status post left total hip arthroplasty. Status post surgical repair of old proximal right femoral fracture. No acute osseous abnormality is noted. IMPRESSION: 1. Cholelithiasis without biliary dilatation. 2. Stable 1.8 cm hypodense area seen in pancreatic head. Recommend follow up pre and post contrast MRI/MRCP or pancreatic protocol CT in 2 years. This recommendation follows ACR consensus guidelines: Management of Incidental Pancreatic Cysts: A White Paper of the ACR Incidental Findings Committee. J Am Coll Radiol 5643;32:[id number]. 3. Stable left renal cysts. 4. No hydronephrosis or renal obstruction is noted. 5. No renal or ureteral calculi are noted. 6. Aortic atherosclerosis. Aortic Atherosclerosis (ICD10-I70.0). Electronically Signed   By: Marijo Conception M.D.   On: 09/22/2020 14:08   DG Chest 2 View  Result Date: 09/22/2020 CLINICAL DATA:  Cough and weakness. EXAM: CHEST - 2 VIEW COMPARISON:  07/07/2020 FINDINGS: Right upper lobe mass lesion unchanged approximately 3.5 x 4.5 cm. Smaller 1 cm nodule left lateral midlung unchanged from 05/20/2020. Negative for heart failure or pneumonia.  No effusion. Bilateral shoulder replacement. IMPRESSION: Mass lesion  right upper lobe unchanged consistent with carcinoma. Second nodule in the left upper lobe may represent metastatic disease or a second neoplasm. Electronically Signed   By: Franchot Gallo M.D.   On: 09/22/2020 14:07   CT Head Wo Contrast  Result Date: 09/22/2020 CLINICAL DATA:  Delirium EXAM: CT HEAD WITHOUT CONTRAST TECHNIQUE: Contiguous axial images were obtained from the base of the skull through the vertex without intravenous contrast. COMPARISON:  07/07/2020 FINDINGS: Brain: No evidence of acute infarction, hemorrhage, hydrocephalus, extra-axial  collection or mass lesion/mass effect. Moderate low-density changes within the periventricular and subcortical white matter compatible with chronic microvascular ischemic change. Moderate diffuse cerebral volume loss, most pronounced within the temporal lobes. Vascular: Atherosclerotic calcifications involving the large vessels of the skull base. No unexpected hyperdense vessel. Skull: Normal. Negative for fracture or focal lesion. Sinuses/Orbits: Scattered bilateral mastoid air cell opacification. Paranasal sinuses are clear. Unremarkable orbital structures. Other: None. IMPRESSION: 1. No acute intracranial findings. 2. Chronic microvascular ischemic change and cerebral volume loss. 3. Scattered bilateral mastoid air cell opacification. Electronically Signed   By: Davina Poke D.O.   On: 09/22/2020 11:28    Procedures Procedures (including critical care time)  Medications Ordered in ED Medications  albuterol (VENTOLIN HFA) 108 (90 Base) MCG/ACT inhaler 2 puff (has no administration in time range)  sodium chloride 0.9 % bolus 500 mL (500 mLs Intravenous New Bag/Given 09/22/20 1408)    ED Course  I have reviewed the triage vital signs and the nursing notes.  Pertinent labs & imaging results that were available during my care of the patient were reviewed by me and considered in my medical decision making (see chart for details).    MDM Rules/Calculators/A&P                          Patient is a 86 year old female who presents with difficulty with ambulation and falls since last night.  On exam, she does have some possible weakness in her left leg but her other extremities appear to be normal.  It is hard to tell if this is related to her prior hip fracture or a new finding.  I did get her up and ambulate her and she was very unsteady and required 2 person assist.  She did seem to be moving both of her legs symmetrically.  She is out of the window for stroke treatment.  She does not have  evidence of an LVO.  Her head CT shows no acute abnormality.  Her labs show an anemia which seems to be a chronic finding.  She has had some recurrent issues with GI bleeding.  Her hemoglobin is on chart review tend to run between 8 and 10 with a baseline being around 9.  She has had to get transfused before when dropped down to 6.  She does not have any obvious GI bleeding or melena.  Hemoccult was sent but there was no gross blood or melena on exam.  Her vital signs have been stable.  She does not appear septic.  Her chest x-ray shows no evidence of pneumonia.  Her CT of her abdomen shows no new masses.  CT of her head shows no acute abnormalities.  She likely will need admission given her inability to ambulate unassisted.  We are waiting her urinalysis and Hemoccult.  Dr. Melina Copa take over care pending these results. Final Clinical Impression(s) / ED Diagnoses Final diagnoses:  None    Rx / DC  Orders ED Discharge Orders    None       Malvin Johns, MD 09/22/20 1540

## 2020-09-23 ENCOUNTER — Encounter (HOSPITAL_BASED_OUTPATIENT_CLINIC_OR_DEPARTMENT_OTHER): Payer: Self-pay | Admitting: Internal Medicine

## 2020-09-23 DIAGNOSIS — R296 Repeated falls: Secondary | ICD-10-CM | POA: Diagnosis present

## 2020-09-23 DIAGNOSIS — M069 Rheumatoid arthritis, unspecified: Secondary | ICD-10-CM | POA: Diagnosis present

## 2020-09-23 DIAGNOSIS — F0390 Unspecified dementia without behavioral disturbance: Secondary | ICD-10-CM

## 2020-09-23 DIAGNOSIS — U071 COVID-19: Principal | ICD-10-CM

## 2020-09-23 DIAGNOSIS — Z83511 Family history of glaucoma: Secondary | ICD-10-CM | POA: Diagnosis not present

## 2020-09-23 DIAGNOSIS — Z8249 Family history of ischemic heart disease and other diseases of the circulatory system: Secondary | ICD-10-CM | POA: Diagnosis not present

## 2020-09-23 DIAGNOSIS — D508 Other iron deficiency anemias: Secondary | ICD-10-CM | POA: Diagnosis not present

## 2020-09-23 DIAGNOSIS — F17210 Nicotine dependence, cigarettes, uncomplicated: Secondary | ICD-10-CM | POA: Diagnosis present

## 2020-09-23 DIAGNOSIS — Z681 Body mass index (BMI) 19 or less, adult: Secondary | ICD-10-CM | POA: Diagnosis not present

## 2020-09-23 DIAGNOSIS — E2740 Unspecified adrenocortical insufficiency: Secondary | ICD-10-CM | POA: Diagnosis present

## 2020-09-23 DIAGNOSIS — Z981 Arthrodesis status: Secondary | ICD-10-CM | POA: Diagnosis not present

## 2020-09-23 DIAGNOSIS — F0280 Dementia in other diseases classified elsewhere without behavioral disturbance: Secondary | ICD-10-CM | POA: Diagnosis present

## 2020-09-23 DIAGNOSIS — Z833 Family history of diabetes mellitus: Secondary | ICD-10-CM | POA: Diagnosis not present

## 2020-09-23 DIAGNOSIS — R918 Other nonspecific abnormal finding of lung field: Secondary | ICD-10-CM

## 2020-09-23 DIAGNOSIS — M0579 Rheumatoid arthritis with rheumatoid factor of multiple sites without organ or systems involvement: Secondary | ICD-10-CM

## 2020-09-23 DIAGNOSIS — Z9841 Cataract extraction status, right eye: Secondary | ICD-10-CM | POA: Diagnosis not present

## 2020-09-23 DIAGNOSIS — Z86711 Personal history of pulmonary embolism: Secondary | ICD-10-CM | POA: Diagnosis not present

## 2020-09-23 DIAGNOSIS — Z66 Do not resuscitate: Secondary | ICD-10-CM | POA: Diagnosis not present

## 2020-09-23 DIAGNOSIS — Z515 Encounter for palliative care: Secondary | ICD-10-CM | POA: Diagnosis not present

## 2020-09-23 DIAGNOSIS — I10 Essential (primary) hypertension: Secondary | ICD-10-CM | POA: Diagnosis present

## 2020-09-23 DIAGNOSIS — D6489 Other specified anemias: Secondary | ICD-10-CM | POA: Diagnosis not present

## 2020-09-23 DIAGNOSIS — Z9842 Cataract extraction status, left eye: Secondary | ICD-10-CM | POA: Diagnosis not present

## 2020-09-23 DIAGNOSIS — M6281 Muscle weakness (generalized): Secondary | ICD-10-CM

## 2020-09-23 DIAGNOSIS — D649 Anemia, unspecified: Secondary | ICD-10-CM | POA: Diagnosis present

## 2020-09-23 DIAGNOSIS — Z888 Allergy status to other drugs, medicaments and biological substances status: Secondary | ICD-10-CM | POA: Diagnosis not present

## 2020-09-23 DIAGNOSIS — Z7952 Long term (current) use of systemic steroids: Secondary | ICD-10-CM

## 2020-09-23 DIAGNOSIS — R410 Disorientation, unspecified: Secondary | ICD-10-CM

## 2020-09-23 DIAGNOSIS — I959 Hypotension, unspecified: Secondary | ICD-10-CM | POA: Diagnosis present

## 2020-09-23 DIAGNOSIS — G309 Alzheimer's disease, unspecified: Secondary | ICD-10-CM | POA: Diagnosis present

## 2020-09-23 DIAGNOSIS — C3411 Malignant neoplasm of upper lobe, right bronchus or lung: Secondary | ICD-10-CM | POA: Diagnosis present

## 2020-09-23 DIAGNOSIS — E43 Unspecified severe protein-calorie malnutrition: Secondary | ICD-10-CM | POA: Diagnosis present

## 2020-09-23 LAB — URINE CULTURE: Culture: <10000 — AB

## 2020-09-23 MED ORDER — ACETAMINOPHEN 325 MG PO TABS: 650.0000 mg | ORAL_TABLET | Freq: Four times a day (QID) | ORAL | Status: DC | PRN

## 2020-09-23 MED ORDER — ONDANSETRON HCL 4 MG PO TABS: 4.0000 mg | ORAL_TABLET | Freq: Four times a day (QID) | ORAL | Status: DC | PRN

## 2020-09-23 MED ORDER — ONDANSETRON HCL 4 MG/2ML IJ SOLN: 4.0000 mg | Freq: Four times a day (QID) | INTRAMUSCULAR | Status: DC | PRN

## 2020-09-23 MED ORDER — SODIUM CHLORIDE 0.9 % IV SOLN: INTRAVENOUS | Status: DC | PRN

## 2020-09-23 MED ORDER — SULFAMETHOXAZOLE-TRIMETHOPRIM 800-160 MG PO TABS: 1.0000 | ORAL_TABLET | ORAL | Status: DC

## 2020-09-23 MED ORDER — PREDNISONE 5 MG PO TABS
15.0000 mg | ORAL_TABLET | Freq: Every day | ORAL | Status: DC
  Administered 2020-09-24 – 2020-09-27 (×4): 15 mg via ORAL
  Filled 2020-09-23 (×5): qty 1

## 2020-09-23 NOTE — ED Notes (Signed)
Pt up to bedside commode with minimal assistance. No complaints at this time.

## 2020-09-23 NOTE — ED Notes (Signed)
Attempted to  Call Marcia Lynch to give update, VM

## 2020-09-23 NOTE — ED Notes (Signed)
Pt given meal provided by family member

## 2020-09-23 NOTE — H&P (Signed)
History and Physical    Marcia Lynch RPR:[id number] DOB: 08/20/1931 DOA: 09/22/2020  PCP: Loraine Leriche., MD  Patient coming from: [facility]  I have personally briefly reviewed patient's old medical records in [place]  Chief Complaint: Generalized weakness  HPI: Marcia Lynch is a 86 y.o. female with medical history significant of Alzheimer's dementia, RA on chronic steroids, lung mass probably cancer not undergoing further work up or treatment due to age.  Pt has had COVID vaccine(s) or at least 1 of the 2 Moderna's  Pt presented to ED at [facility] on 10/1 with multiple falls and increased confusion.  Unable to ambulate safely in department.  No injuries from fall.  She normally walks with a walker but has had to have more assistance during the night.  Denies recent fevers, N/V, diarrhea, dysuria.  Pt's friend who is also POA and brought her to ED notes: Pt has h/o lung mass, no further treatment or diagnosis as family wouldn't want to pursue due to age.  Concern about abd distention though.  No abd pain.   ED Course: Work up in ED was ultimately only remarkable for a positive COVID test, Tm 99.8.  Had episode of mild hypotension (94/48).  Creat 1.45 is similar to July.  CXR just shows the big lung mass (likely cancer) and a smaller lung nodule (? Met).  CT abd/pelvis just shows a stable 1.8cm pancreatic mass that they rec following up in 2 years.     Review of Systems: As per HPI, otherwise all review of systems negative.  Past Medical History:  Diagnosis Date  . Arthritis   . Cough    loose  started 05/05/15 no fever  . Dementia ([place])    STARTING SIGNS OF DEMENTIA  . Depression    TAKING ZOLOFT FOR A LONG TIME  . Headache(784.0)   . Hypertension   . Lung mass   . PE (pulmonary thromboembolism) ([place])   . Rheumatoid arthritis [facility])     Past Surgical History:  Procedure Laterality Date  . CARPAL TUNNEL RELEASE     RIGHT  . EYE SURGERY     BILATERAL  CATARACTS  . FRACTURE SURGERY     RIGHT HIP-HAS SCREW  . POSTERIOR CERVICAL FUSION/FORAMINOTOMY N/A 09/20/2013   Procedure: CERVICAL THREE TO CERVICAL SEVEN  POSTERIOR CERVICAL FUSION;  Surgeon: Elaina Hoops, MD;  Location: [place] NEURO ORS;  Service: Neurosurgery;  Laterality: N/A;  CERVICAL THREE TO CERVICAL SEVEN  POSTERIOR CERVICAL FUSION  . POSTERIOR CERVICAL FUSION/FORAMINOTOMY N/A 05/10/2015   Procedure: Posterior Cervical Laminectomy Diskectomy and Fusion Cervical seven Thoracic one. Exploration of Fusion Removal of Hardware Cervical three to seven;  Surgeon: Kary Kos, MD;  Location: [place] NEURO ORS;  Service: Neurosurgery;  Laterality: N/A;  . ROTATOR CUFF REPAIR     RIGHT SHOULDER  . SHOULDER ARTHROSCOPY     ? LEFT BONE SPURSS     reports that she has been smoking cigarettes. She has a 17.00 pack-year smoking history. She has never used smokeless tobacco. She reports that she does not drink alcohol and does not use drugs.  Allergies  Allergen Reactions  . Methotrexate Other (See Comments)    Severe pancytopenia  Severe pancytopenia    . Cymbalta [Duloxetine Hcl] Rash  . Duloxetine Rash    Rash with Itching 08/20/10    Family History  Problem Relation Age of Onset  . Glaucoma Sister   . Hypertension Sister   . Diabetes Sister  Prior to Admission medications   Medication Sig Start Date End Date Taking? Authorizing Provider  acetaminophen (TYLENOL) 325 MG tablet Take 650 mg by mouth every 6 (six) hours as needed (For Pain).    [provider]  albuterol (VENTOLIN HFA) 108 (90 Base) MCG/ACT inhaler Inhale 2 puffs into the lungs every 6 (six) hours as needed for wheezing or shortness of breath. 03/03/20   Granville Lewis C, PA-C  amLODipine (NORVASC) 5 MG tablet Take 0.5 tablets (2.5 mg total) by mouth daily. 03/03/20   Granville Lewis C, PA-C  apixaban (ELIQUIS) 5 MG TABS tablet Take 1 tablet (5 mg total) by mouth 2 (two) times daily. 03/03/20   Granville Lewis C, PA-C  ascorbic  acid (VITAMIN C) 500 MG tablet Take 1 tablet (500 mg total) by mouth daily. 01/07/20   Granville Lewis C, PA-C  atorvastatin (LIPITOR) 10 MG tablet Take 1 tablet (10 mg total) by mouth at bedtime. FOR HYPERCHOLESTEROLEMIA 03/03/20   Granville Lewis C, PA-C  buPROPion (WELLBUTRIN XL) 300 MG 24 hr tablet Take 1 tablet (300 mg total) by mouth at bedtime. 03/03/20   Granville Lewis C, PA-C  calcium carbonate (OS-CAL) 600 MG TABS tablet Take 600 mg by mouth daily with breakfast.     [provider]  donepezil (ARICEPT) 10 MG tablet Take 1 tablet (10 mg total) by mouth 2 (two) times daily. 03/03/20   Granville Lewis C, PA-C  Ensure (ENSURE) Take 237 mLs by mouth 2 (two) times daily. Lactose reduced    [provider]  ferrous sulfate 325 (65 FE) MG tablet Take 1 tablet (325 mg total) by mouth 2 (two) times daily with a meal. For Anemia 01/07/20   Granville Lewis C, PA-C  HYDROcodone-acetaminophen (NORCO/VICODIN) 5-325 MG tablet Take 1 tablet by mouth 2 (two) times daily. 03/17/20   Hennie Duos, MD  leflunomide (ARAVA) 20 MG tablet Take 1 tablet (20 mg total) by mouth daily. Take on M-W-F-Su 03/03/20   Wille Celeste, PA-C  memantine (NAMENDA XR) 28 MG CP24 24 hr capsule Take 1 capsule (28 mg total) by mouth daily. 03/03/20   Granville Lewis C, PA-C  methocarbamol (ROBAXIN) 500 MG tablet Take 1 tablet (500 mg total) by mouth at bedtime. This may cause dizziness fatigue and increase risk of falls.  Please use sparingly and cautiously. 07/06/20   Tedd Sias, PA  mirtazapine (REMERON) 30 MG tablet Take 1 tablet (30 mg total) by mouth every evening. 03/03/20   Wille Celeste, PA-C  Multiple Vitamin (MULTIVITAMIN WITH MINERALS) TABS tablet Take 1 tablet by mouth daily.    [provider]  Nutritional Supplements (NUTRITIONAL SUPPLEMENT PO) Take 1 each by mouth in the morning and at bedtime. L Magic Cup with lunch and dinner    [provider]  omeprazole (PRILOSEC) 20 MG capsule Take 1 capsule  (20 mg total) by mouth daily. 03/03/20   Granville Lewis C, PA-C  potassium chloride SA (KLOR-CON) 20 MEQ tablet Take 1 tablet (20 mEq total) by mouth daily. 03/03/20   Wille Celeste, PA-C  predniSONE (DELTASONE) 5 MG tablet Take 1 tablet (5 mg total) by mouth daily with breakfast. Take on M-W-F-Sun 03/03/20   Wille Celeste, PA-C  senna-docusate (SENOKOT-S) 8.6-50 MG tablet Take 1 tablet by mouth every evening.    [provider]  sertraline (ZOLOFT) 100 MG tablet Take 1.5 tablets (150 mg total) by mouth daily. 03/03/20   Granville Lewis C, PA-C  sulfamethoxazole-trimethoprim (  BACTRIM DS) 800-160 MG tablet TAKE 1 TABLET BY MOUTH EVERY MONDAY - WEDNESDAY - FRIDAY FOR PCP PROPHYLAXIS DUE TO CHRONIC STEROID USE 03/03/20   Granville Lewis C, PA-C  vitamin B-12 (CYANOCOBALAMIN) 1000 MCG tablet Take 1 tablet (1,000 mcg total) by mouth daily. 03/03/20   Wille Celeste, PA-C    Physical Exam: Vitals:   09/23/20 2100 09/23/20 2130 09/23/20 2200 09/23/20 2309  BP: 130/64 96/69 127/61 137/70  Pulse: 79 84 78 84  Resp: 19 19 19 19   Temp:   99.8 F (37.7 C) 99.4 F (37.4 C)  TempSrc:   Oral Oral  SpO2: 99% 99% 99% 97%  Weight:      Height:        Constitutional: NAD, calm, comfortable Eyes: PERRL, lids and conjunctivae normal ENMT: Mucous membranes are moist. Posterior pharynx clear of any exudate or lesions.Normal dentition.  Neck: normal, supple, no masses, no thyromegaly Respiratory: clear to auscultation bilaterally, no wheezing, no crackles. Normal respiratory effort. No accessory muscle use.  Cardiovascular: Regular rate and rhythm, no murmurs / rubs / gallops. No extremity edema. 2+ pedal pulses. No carotid bruits.  Abdomen: no tenderness, no masses palpated. No hepatosplenomegaly. Bowel sounds positive.  Musculoskeletal: no clubbing / cyanosis. No joint deformity upper and lower extremities. Good ROM, no contractures. Normal muscle tone.  Skin: no rashes, lesions, ulcers. No  induration Neurologic: CN 2-12 grossly intact. Sensation intact, DTR normal. Strength 5/5 in all 4.  Psychiatric: Pleasant but confused, speech clear, but doesn't know or understand what COVID is even after trying to explain it to her.   Labs on Admission: I have personally reviewed following labs and imaging studies  CBC: Recent Labs  Lab 09/22/20 1156  WBC 9.3  NEUTROABS 7.4  HGB 8.8*  HCT 29.6*  MCV 83.4  PLT 914   Basic Metabolic Panel: Recent Labs  Lab 09/22/20 1156  NA 131*  K 4.0  CL 97*  CO2 25  GLUCOSE 84  BUN 12  CREATININE 1.45*  CALCIUM 8.7*   GFR: Estimated Creatinine Clearance: 20.9 mL/min (A) (by C-G formula based on SCr of 1.45 mg/dL (H)). Liver Function Tests: Recent Labs  Lab 09/22/20 1156  AST 22  ALT 13  ALKPHOS 98  BILITOT 0.5  PROT 6.2*  ALBUMIN 3.3*   No results for input(s): LIPASE, AMYLASE in the last 168 hours. No results for input(s): AMMONIA in the last 168 hours. Coagulation Profile: No results for input(s): INR, PROTIME in the last 168 hours. Cardiac Enzymes: No results for input(s): CKTOTAL, CKMB, CKMBINDEX, TROPONINI in the last 168 hours. BNP (last 3 results) No results for input(s): PROBNP in the last 8760 hours. HbA1C: No results for input(s): HGBA1C in the last 72 hours. CBG: No results for input(s): GLUCAP in the last 168 hours. Lipid Profile: No results for input(s): CHOL, HDL, LDLCALC, TRIG, CHOLHDL, LDLDIRECT in the last 72 hours. Thyroid Function Tests: No results for input(s): TSH, T4TOTAL, FREET4, T3FREE, THYROIDAB in the last 72 hours. Anemia Panel: No results for input(s): VITAMINB12, FOLATE, FERRITIN, TIBC, IRON, RETICCTPCT in the last 72 hours. Urine analysis:    Component Value Date/Time   COLORURINE YELLOW 09/22/2020 1005   APPEARANCEUR CLEAR 09/22/2020 1005   LABSPEC 1.020 09/22/2020 1005   PHURINE 7.0 09/22/2020 1005   GLUCOSEU NEGATIVE 09/22/2020 1005   HGBUR NEGATIVE 09/22/2020 1005    BILIRUBINUR NEGATIVE 09/22/2020 1005   KETONESUR NEGATIVE 09/22/2020 1005   PROTEINUR NEGATIVE 09/22/2020 1005   NITRITE NEGATIVE  09/22/2020 1005   LEUKOCYTESUR NEGATIVE 09/22/2020 1005    Radiological Exams on Admission: CT ABDOMEN PELVIS WO CONTRAST  Result Date: 09/22/2020 CLINICAL DATA:  Acute abdominal pain. EXAM: CT ABDOMEN AND PELVIS WITHOUT CONTRAST TECHNIQUE: Multidetector CT imaging of the abdomen and pelvis was performed following the standard protocol without IV contrast. COMPARISON:  March 09, 2020. FINDINGS: Lower chest: No acute abnormality. Hepatobiliary: Cholelithiasis is noted without biliary dilatation. The liver is unremarkable. Pancreas: Grossly stable 1.8 cm hypodense area seen in pancreatic head. No pancreatic ductal dilatation or surrounding inflammatory changes. Spleen: Stable splenic cyst. Adrenals/Urinary Tract: Adrenal glands appear normal. Stable left renal cysts are noted. No hydronephrosis or renal obstruction is noted. No renal or ureteral calculi are noted. Urinary bladder somewhat obscured due to scatter artifact arising from left hip prosthesis. Visualized portion is unremarkable. Stomach/Bowel: Stomach is within normal limits. Appendix appears normal. No evidence of bowel wall thickening, distention, or inflammatory changes. Vascular/Lymphatic: Aortic atherosclerosis. No enlarged abdominal or pelvic lymph nodes. Reproductive: No definite mass or other abnormality is noted. Other: No abdominal wall hernia or abnormality. No abdominopelvic ascites. Musculoskeletal: As noted above, status post left total hip arthroplasty. Status post surgical repair of old proximal right femoral fracture. No acute osseous abnormality is noted. IMPRESSION: 1. Cholelithiasis without biliary dilatation. 2. Stable 1.8 cm hypodense area seen in pancreatic head. Recommend follow up pre and post contrast MRI/MRCP or pancreatic protocol CT in 2 years. This recommendation follows ACR consensus  guidelines: Management of Incidental Pancreatic Cysts: A White Paper of the ACR Incidental Findings Committee. J Am Coll Radiol 2094;70:962-836. 3. Stable left renal cysts. 4. No hydronephrosis or renal obstruction is noted. 5. No renal or ureteral calculi are noted. 6. Aortic atherosclerosis. Aortic Atherosclerosis (ICD10-I70.0). Electronically Signed   By: Marijo Conception M.D.   On: 09/22/2020 14:08   DG Chest 2 View  Result Date: 09/22/2020 CLINICAL DATA:  Cough and weakness. EXAM: CHEST - 2 VIEW COMPARISON:  07/07/2020 FINDINGS: Right upper lobe mass lesion unchanged approximately 3.5 x 4.5 cm. Smaller 1 cm nodule left lateral midlung unchanged from 05/20/2020. Negative for heart failure or pneumonia.  No effusion. Bilateral shoulder replacement. IMPRESSION: Mass lesion right upper lobe unchanged consistent with carcinoma. Second nodule in the left upper lobe may represent metastatic disease or a second neoplasm. Electronically Signed   By: Franchot Gallo M.D.   On: 09/22/2020 14:07   CT Head Wo Contrast  Result Date: 09/22/2020 CLINICAL DATA:  Delirium EXAM: CT HEAD WITHOUT CONTRAST TECHNIQUE: Contiguous axial images were obtained from the base of the skull through the vertex without intravenous contrast. COMPARISON:  07/07/2020 FINDINGS: Brain: No evidence of acute infarction, hemorrhage, hydrocephalus, extra-axial collection or mass lesion/mass effect. Moderate low-density changes within the periventricular and subcortical white matter compatible with chronic microvascular ischemic change. Moderate diffuse cerebral volume loss, most pronounced within the temporal lobes. Vascular: Atherosclerotic calcifications involving the large vessels of the skull base. No unexpected hyperdense vessel. Skull: Normal. Negative for fracture or focal lesion. Sinuses/Orbits: Scattered bilateral mastoid air cell opacification. Paranasal sinuses are clear. Unremarkable orbital structures. Other: None. IMPRESSION: 1. No  acute intracranial findings. 2. Chronic microvascular ischemic change and cerebral volume loss. 3. Scattered bilateral mastoid air cell opacification. Electronically Signed   By: Davina Poke D.O.   On: 09/22/2020 11:28    EKG: Independently reviewed.  Assessment/Plan Principal Problem:   COVID-19 Active Problems:   Rheumatoid arthritis ([facility])   Pulmonary mass   Anemia  Advanced dementia ([place])   Generalized muscle weakness   Current chronic use of systemic steroids    1. COVID-19 - 1. Pt with at least 1 documented vaccine (Moderna in Dec), not sure if she got other dose or not. 2. No O2 requirement at this time 3. Think generalized weakness is due to mild COVID-19, also mild adrenal insufficiency in setting of acute illness with chronic steroid use 4. Will continue remdesivir as she has already had 2 doses of this 5. Daily labs 2. Chronic steroid use - ? Mild adrenal insufficiency in setting of acute illness 1. No O2 requirement, doesn't need high dose steroids, but will put patient on "sick" dose steroids: 78m prednisone daily for now (usually takes 575mx4 times a week).  First dose now. 3. Pulmonary mass - 1. Likely lung CA, family doesn't want further work up or treatment. 2. Will want to address code status with family as she was still a full code at last HPR admission despite this. 3. Continue MWF bactrim she takes chronically. 4. Anemia - 1. Chronic and stable 5. Advanced dementia - 1. Cont home meds when med rec completed 6. HTN - 1. Will leave pt on the small dose of amlodipine she takes, current BP 13470Lystolic. 7. RA - 1. Chronic steroids  DVT prophylaxis: Eliquis Code Status: Full Code for now ([facility] probably will want to discuss this more at length with POA during day tomorrow though as she has untreated lung CA, dementia, etc. Family Communication: No family in room Disposition Plan: TBD Consults called: None Admission  status: Place in [id number]   Robbye Dede, JACourtlandospitalists  How to contact the TRPeak Behavioral Health Servicesttending or Consulting provider 7ATellico Plainsr covering provider during after hours 7PNorth Wantaghfor this patient?  1. Check the care team in [facility]d look for a) attending/consulting TRH provider listed and b) the TRBaptist Memorial Restorative Care Hospitaleam listed 2. Log into [email or web address]  Amion Physician Scheduling and messaging for groups and whole hospitals  On call and physician scheduling software for group practices, residents, hospitalists and other medical providers for call, clinic, rotation and shift schedules. OnCall [employer] is a hospital-wide system for scheduling doctors and paging doctors on call. EasyPlot is for scientific plotting and data analysis.  [email or web address]  and use [facility]'s universal password to access. If you do not have the password, please contact the hospital operator.  3. Locate the TRMedstar Franklin Square Medical Centerrovider you are looking for under [facility] and page to a number that you can be directly reached. 4. If you still have difficulty reaching the provider, please page the DOProvidence HospitalDirector on Call) for the Hospitalists listed on amion for assistance.  09/23/2020, 11:52 PM

## 2020-09-24 ENCOUNTER — Encounter (HOSPITAL_COMMUNITY): Payer: Self-pay | Admitting: Internal Medicine

## 2020-09-24 DIAGNOSIS — I10 Essential (primary) hypertension: Secondary | ICD-10-CM

## 2020-09-24 DIAGNOSIS — E43 Unspecified severe protein-calorie malnutrition: Secondary | ICD-10-CM

## 2020-09-24 DIAGNOSIS — M05712 Rheumatoid arthritis with rheumatoid factor of left shoulder without organ or systems involvement: Secondary | ICD-10-CM

## 2020-09-24 DIAGNOSIS — M05711 Rheumatoid arthritis with rheumatoid factor of right shoulder without organ or systems involvement: Secondary | ICD-10-CM

## 2020-09-24 DIAGNOSIS — D508 Other iron deficiency anemias: Secondary | ICD-10-CM

## 2020-09-24 DIAGNOSIS — Z515 Encounter for palliative care: Secondary | ICD-10-CM

## 2020-09-24 DIAGNOSIS — U071 COVID-19: Secondary | ICD-10-CM | POA: Diagnosis present

## 2020-09-24 DIAGNOSIS — Z7189 Other specified counseling: Secondary | ICD-10-CM

## 2020-09-24 DIAGNOSIS — R531 Weakness: Secondary | ICD-10-CM

## 2020-09-24 LAB — COMPREHENSIVE METABOLIC PANEL
ALT: 14 U/L (ref 0–44)
AST: 23 U/L (ref 15–41)
Albumin: 3 g/dL — ABNORMAL LOW (ref 3.5–5.0)
Alkaline Phosphatase: 89 U/L (ref 38–126)
Anion gap: 11 (ref 5–15)
BUN: 25 mg/dL — ABNORMAL HIGH (ref 8–23)
CO2: 23 mmol/L (ref 22–32)
Calcium: 8.7 mg/dL — ABNORMAL LOW (ref 8.9–10.3)
Chloride: 103 mmol/L (ref 98–111)
Creatinine, Ser: 1.27 mg/dL — ABNORMAL HIGH (ref 0.44–1.00)
GFR calc Af Amer: 44 mL/min — ABNORMAL LOW (ref >60)
GFR calc non Af Amer: 38 mL/min — ABNORMAL LOW (ref >60)
Glucose, Bld: 103 mg/dL — ABNORMAL HIGH (ref 70–99)
Potassium: 3.8 mmol/L (ref 3.5–5.1)
Sodium: 137 mmol/L (ref 135–145)
Total Bilirubin: 0.4 mg/dL (ref 0.3–1.2)
Total Protein: 5.9 g/dL — ABNORMAL LOW (ref 6.5–8.1)

## 2020-09-24 LAB — CBC WITH DIFFERENTIAL/PLATELET
Abs Immature Granulocytes: 0.03 10*3/uL (ref 0.00–0.07)
Basophils Absolute: 0 10*3/uL (ref 0.0–0.1)
Basophils Relative: 0 %
Eosinophils Absolute: 0 10*3/uL (ref 0.0–0.5)
Eosinophils Relative: 0 %
HCT: 33.1 % — ABNORMAL LOW (ref 36.0–46.0)
Hemoglobin: 9.6 g/dL — ABNORMAL LOW (ref 12.0–15.0)
Immature Granulocytes: 1 %
Lymphocytes Relative: 15 %
Lymphs Abs: 0.8 10*3/uL (ref 0.7–4.0)
MCH: 25.7 pg — ABNORMAL LOW (ref 26.0–34.0)
MCHC: 29 g/dL — ABNORMAL LOW (ref 30.0–36.0)
MCV: 88.7 fL (ref 80.0–100.0)
Monocytes Absolute: 0.4 10*3/uL (ref 0.1–1.0)
Monocytes Relative: 8 %
Neutro Abs: 4 10*3/uL (ref 1.7–7.7)
Neutrophils Relative %: 76 %
Platelets: 210 10*3/uL (ref 150–400)
RBC: 3.73 MIL/uL — ABNORMAL LOW (ref 3.87–5.11)
RDW: 18.9 % — ABNORMAL HIGH (ref 11.5–15.5)
WBC: 5.3 10*3/uL (ref 4.0–10.5)
nRBC: 0 % (ref 0.0–0.2)

## 2020-09-24 LAB — D-DIMER, QUANTITATIVE: D-Dimer, Quant: 2.07 ug/mL-FEU — ABNORMAL HIGH (ref 0.00–0.50)

## 2020-09-24 LAB — C-REACTIVE PROTEIN: CRP: 7.3 mg/dL — ABNORMAL HIGH (ref <1.0)

## 2020-09-24 MED ORDER — CHLORHEXIDINE GLUCONATE CLOTH 2 % EX PADS
6.0000 | MEDICATED_PAD | Freq: Every day | CUTANEOUS | Status: DC
  Administered 2020-09-24 – 2020-09-27 (×4): 6 via TOPICAL

## 2020-09-24 MED ORDER — ENOXAPARIN SODIUM 30 MG/0.3ML ~~LOC~~ SOLN
30.0000 mg | SUBCUTANEOUS | Status: DC
  Administered 2020-09-25 – 2020-09-27 (×3): 30 mg via SUBCUTANEOUS
  Filled 2020-09-24 (×3): qty 0.3

## 2020-09-24 NOTE — Consult Note (Signed)
Consultation Note Date: 09/24/2020   Patient Name: Marcia Lynch  DOB: [birth year]  MRN: [id number]  Age / Sex: 86 y.o., female  PCP: Loraine Leriche., MD Referring Physician: Allie Bossier, MD  Reason for Consultation: Establishing goals of care  HPI/Patient Profile: 86 y.o. female    admitted on 09/22/2020     Clinical Assessment and Goals of Care: 86 year old female with a history of dementia, history of rheumatoid arthritis on chronic steroids, history of having a lung mass probably cancer has not had further work-up or treatment due to age.  Patient initially presented to emergency department at [facility] with multiple falls and increased confusion.  Work-up in the ED was found to have a positive Covid test.  She was transferred to [facility].  She does not require supplemental oxygen at the moment, she has been started on remdesivir, she is on steroids.  A palliative medicine consultation has been requested for CODE STATUS and goals of care discussions.  Patient is a pleasant appearing elderly person resting in bed.  She is trying to eat her food.  She denies any complaints.  She states that she does not like the food.  She states that her roommate is Karle Plumber.  That she is also her healthcare power of attorney agent.  She does not have a spouse does not have any kids.  She has a nephew in [place] and possibly also in [place].  I tried to discuss directly with the patient about concepts such as full code versus DO NOT RESUSCITATE, palliative care, hospice philosophy of care and comfort measures.  Patient states she does not understand what I am saying, does not understand what Covid means.  I tried to explain to the best of my ability.  Towards the end, the patient stated that she fully trusted Karle Plumber and that I should speak with her.  Call placed and  discussed with Karle Plumber at [phone].  This is her home number.  Her cell number is [phone].  I introduced myself and palliative care as follows: Palliative medicine is specialized medical care for people living with serious illness. It focuses on providing relief from the symptoms and stress of a serious illness. The goal is to improve quality of life for both the patient and the family.  Goals of care: Broad aims of medical therapy in relation to the patient's values and preferences. Our aim is to provide medical care aimed at enabling patients to achieve the goals that matter most to them, given the circumstances of their particular medical situation and their constraints.   I gave Ms. Wynetta Emery an update about the patient's current medical condition.  Goals wishes and values attempted to be explored.  Background information obtained.  Brief life review obtained.  Underlying illnesses discussed.  Ms. Wynetta Emery states that the patient is essentially homebound.  She is really unsure how the patient got Covid from.  Ms. Wynetta Emery herself is going to go get her Covid test tomorrow  on 10-4.  Patient's baseline is such that she easily becomes confused and agitated.  She has had decreased appetite and decreased p.o. intake for some time now.  Overall she is homebound.  She has frequent falls.  In December 2020, patient took a fall and had a hip fracture.  At that time she was found to have a lung mass that was deemed likely to be malignant in etiology.  Ms. Wynetta Emery states that at that time she made the decision not to pursue further treatment given the patient's age.  Patient briefly went to rehab at [facility] and then went back home to live with Ms. Wynetta Emery.  While she was at [facility] facility patient got her first Covid shot in December and her second Covid shot in January.  Ms. Wynetta Emery is sure that the patient is fully vaccinated.  We discussed about current treatment protocol that the patient is on  as far as her COVID-19 infection.  We also discussed about pulmonary mass and chest x-ray findings.  Discussed that patient has multiple acute and underlying illnesses that put her at high risk for ongoing decline and not doing well overall.  Hence, in my opinion, this would make the patient hospice eligible.  Discussed about hospice philosophy of care.  Asked if CODE STATUS discussions were ever undertaken.  Ms. Wynetta Emery recalls that the patient has told her that she would not want to be on machines.  We discussed about DO NOT RESUSCITATE/DO NOT INTUBATE in great detail.  Please note recommendations as listed below.  HCPOA Roommate and healthcare power of attorney agent Karle Plumber.  SUMMARY OF RECOMMENDATIONS   CODE STATUS now established as DO NOT RESUSCITATE/DO NOT INTUBATE Continue current mode of care as a time-limited trial of current interventions. Home with hospice versus residential hospice towards the end of this hospitalization depending on patient's hospital course, overall disease trajectory, p.o. intake and activity level. Ms. Wynetta Emery has asked that hospital staff continue to reach out to her at least on a daily basis so that she can be informed and so that she can make decisions on the patient's behalf. Thank you for the consult. Code Status/Advance Care Planning:  DNR    Symptom Management:   As above patient not necessarily displaying any acute unmanaged symptoms as of now.  Palliative Prophylaxis:   Delirium Protocol   Psycho-social/Spiritual:   Desire for further Chaplaincy support:yes  Additional Recommendations: Caregiving  Support/Resources  Prognosis:   Unable to determine  Discharge Planning: To Be Determined      Primary Diagnoses: Present on Admission: . Generalized muscle weakness . COVID-19 . Advanced dementia ([place]) . Rheumatoid arthritis ([place]) . Pulmonary mass . Anemia   I have reviewed the medical record, interviewed the patient and  family, and examined the patient. The following aspects are pertinent.  Past Medical History:  Diagnosis Date  . Arthritis   . Cough    loose  started 05/05/15 no fever  . Dementia ([place])    STARTING SIGNS OF DEMENTIA  . Depression    TAKING ZOLOFT FOR A LONG TIME  . Headache(784.0)   . Hypertension   . Lung mass   . PE (pulmonary thromboembolism) ([place])   . Rheumatoid arthritis ([place])    Social History   Socioeconomic History  . Marital status: Divorced    Spouse name: Not on file  . Number of children: Not on file  . Years of education: Not on file  . Highest education level: Not  on file  Occupational History  . Not on file  Tobacco Use  . Smoking status: Current Every Day Smoker    Packs/day: 0.25    Years: 68.00    Pack years: 17.00    Types: Cigarettes  . Smokeless tobacco: Never Used  Substance and Sexual Activity  . Alcohol use: No  . Drug use: No  . Sexual activity: Not on file  Other Topics Concern  . Not on file  Social History Narrative   Lives at home, divorced. Has a 60-pack year history of smoking.   Social Determinants of Health   Financial Resource Strain:   . Difficulty of Paying Living Expenses: Not on file  Food Insecurity:   . Worried About Charity fundraiser in the Last Year: Not on file  . Ran Out of Food in the Last Year: Not on file  Transportation Needs:   . Lack of Transportation (Medical): Not on file  . Lack of Transportation (Non-Medical): Not on file  Physical Activity:   . Days of Exercise per Week: Not on file  . Minutes of Exercise per Session: Not on file  Stress:   . Feeling of Stress : Not on file  Social Connections:   . Frequency of Communication with Friends and Family: Not on file  . Frequency of Social Gatherings with Friends and Family: Not on file  . Attends Religious Services: Not on file  . Active Member of Clubs or Organizations: Not on file  . Attends Archivist Meetings: Not on file  . Marital  Status: Not on file   Family History  Problem Relation Age of Onset  . Glaucoma Sister   . Hypertension Sister   . Diabetes Sister    Scheduled Meds: . amLODipine  2.5 mg Oral Daily  . buPROPion  300 mg Oral QHS  . donepezil  10 mg Oral BID  . memantine  28 mg Oral Daily  . predniSONE  15 mg Oral Q breakfast  . [START ON 09/25/2020] sulfamethoxazole-trimethoprim  1 tablet Oral Once per day on Mon Wed Fri   Continuous Infusions: . sodium chloride Stopped (09/23/20 1055)  . remdesivir 100 mg in NS 100 mL 100 mg (09/24/20 1020)   PRN Meds:.sodium chloride, acetaminophen, ondansetron **OR** ondansetron (ZOFRAN) IV Medications Prior to Admission:  Prior to Admission medications   Medication Sig Start Date End Date Taking? Authorizing Provider  acetaminophen (TYLENOL) 325 MG tablet Take 650 mg by mouth every 6 (six) hours as needed (For Pain).   Yes [provider]  albuterol (VENTOLIN HFA) 108 (90 Base) MCG/ACT inhaler Inhale 2 puffs into the lungs every 6 (six) hours as needed for wheezing or shortness of breath. 03/03/20  Yes Lassen, Arlo C, PA-C  amLODipine (NORVASC) 5 MG tablet Take 0.5 tablets (2.5 mg total) by mouth daily. 03/03/20  Yes Lassen, Arlo C, PA-C  ascorbic acid (VITAMIN C) 500 MG tablet Take 1 tablet (500 mg total) by mouth daily. 01/07/20  Yes Lassen, Arlo C, PA-C  atorvastatin (LIPITOR) 10 MG tablet Take 1 tablet (10 mg total) by mouth at bedtime. FOR HYPERCHOLESTEROLEMIA 03/03/20  Yes Lassen, Arlo C, PA-C  buPROPion (WELLBUTRIN XL) 300 MG 24 hr tablet Take 1 tablet (300 mg total) by mouth at bedtime. Patient taking differently: Take 300 mg by mouth at bedtime.  03/03/20  Yes Lassen, Arlo C, PA-C  calcium carbonate (OS-CAL) 600 MG TABS tablet Take 600 mg by mouth daily with breakfast.  Yes [provider]  donepezil (ARICEPT) 10 MG tablet Take 1 tablet (10 mg total) by mouth 2 (two) times daily. 03/03/20  Yes Lassen, Arlo C, PA-C  Ensure (ENSURE) Take 237  mLs by mouth 2 (two) times daily. Lactose reduced   Yes [provider]  hydroxychloroquine (PLAQUENIL) 200 MG tablet Take 200 mg by mouth daily.   Yes [provider]  memantine (NAMENDA XR) 28 MG CP24 24 hr capsule Take 1 capsule (28 mg total) by mouth daily. 03/03/20  Yes Lassen, Arlo C, PA-C  mirtazapine (REMERON) 30 MG tablet Take 1 tablet (30 mg total) by mouth every evening. 03/03/20  Yes Lassen, Arlo C, PA-C  Multiple Vitamin (MULTIVITAMIN WITH MINERALS) TABS tablet Take 1 tablet by mouth daily.   Yes [provider]  omeprazole (PRILOSEC) 20 MG capsule Take 1 capsule (20 mg total) by mouth daily. 03/03/20  Yes Lassen, Arlo C, PA-C  potassium chloride SA (KLOR-CON) 20 MEQ tablet Take 1 tablet (20 mEq total) by mouth daily. 03/03/20  Yes Lassen, Arlo C, PA-C  predniSONE (DELTASONE) 5 MG tablet Take 1 tablet (5 mg total) by mouth daily with breakfast. Take on M-W-F-Sun Patient taking differently: Take 5 mg by mouth 3 (three) times a week. Take on M-W-F- 03/03/20  Yes Oscar La, Arlo C, PA-C  sertraline (ZOLOFT) 100 MG tablet Take 1.5 tablets (150 mg total) by mouth daily. Patient taking differently: Take 100 mg by mouth daily.  03/03/20  Yes Lassen, Arlo C, PA-C  vitamin B-12 (CYANOCOBALAMIN) 1000 MCG tablet Take 1 tablet (1,000 mcg total) by mouth daily. 03/03/20  Yes Lassen, Arlo C, PA-C  apixaban (ELIQUIS) 5 MG TABS tablet Take 1 tablet (5 mg total) by mouth 2 (two) times daily. Patient not taking: Reported on 09/24/2020 03/03/20   Wille Celeste, PA-C  ferrous sulfate 325 (65 FE) MG tablet Take 1 tablet (325 mg total) by mouth 2 (two) times daily with a meal. For Anemia Patient not taking: Reported on 09/24/2020 01/07/20   Wille Celeste, PA-C  HYDROcodone-acetaminophen (NORCO/VICODIN) 5-325 MG tablet Take 1 tablet by mouth 2 (two) times daily. Patient not taking: Reported on 09/24/2020 03/17/20   Hennie Duos, MD  leflunomide (ARAVA) 20 MG tablet Take 1 tablet (20 mg  total) by mouth daily. Take on M-W-F-Su Patient not taking: Reported on 09/24/2020 03/03/20   Wille Celeste, PA-C  methocarbamol (ROBAXIN) 500 MG tablet Take 1 tablet (500 mg total) by mouth at bedtime. This may cause dizziness fatigue and increase risk of falls.  Please use sparingly and cautiously. Patient not taking: Reported on 09/24/2020 07/06/20   Tedd Sias, PA  sulfamethoxazole-trimethoprim (BACTRIM DS) 800-160 MG tablet TAKE 1 TABLET BY MOUTH EVERY MONDAY - WEDNESDAY - FRIDAY FOR PCP PROPHYLAXIS DUE TO CHRONIC STEROID USE Patient not taking: Reported on 09/24/2020 03/03/20   Wille Celeste, PA-C   Allergies  Allergen Reactions  . Methotrexate Other (See Comments)    Severe pancytopenia  Severe pancytopenia    . Cymbalta [Duloxetine Hcl] Rash  . Duloxetine Rash    Rash with Itching 08/20/10   Review of Systems Denies pain, denies shortness of breath Physical Exam Elderly lady resting in bed trying to eat her food Patient is confused, becomes confused easily.  She has obvious short-term memory deficits.  She answers a few questions appropriately. Regular work of breathing S1-S2 No edema Abdomen is not distended Awake but not oriented.  Does not know she is in the  hospital.  States she lives in [place] but is not able to tell me her address. Vital Signs: BP 113/66 (BP Location: Left Arm)   Pulse 82   Temp 98.4 F (36.9 C) (Oral)   Resp 20   Ht 5\' 4"  (1.626 m)   Wt 47.6 kg   SpO2 97%   BMI 18.02 kg/m  Pain Scale: 0-10   Pain Score: 0-No pain   SpO2: SpO2: 97 % O2 Device:SpO2: 97 % O2 Flow Rate: .O2 Flow Rate (L/min): 1 L/min  IO: Intake/output summary: No intake or output data in the 24 hours ending 09/24/20 1402  LBM:   Baseline Weight: Weight: 47.6 kg Most recent weight: Weight: 47.6 kg     Palliative Assessment/Data:   PPS 40%  Time In:  1300 Time Out:  1400 Time Total:  60  Greater than 50%  of this time was spent counseling and coordinating care  related to the above assessment and plan.  Signed by: Loistine Chance, MD   Please contact Palliative Medicine Team phone at [phone] for questions and concerns.  For individual provider: See Shea Evans

## 2020-09-24 NOTE — Progress Notes (Addendum)
Bladder scanned this patient due to not voiding at all yet today. Bladder scan said 137mL were in bladder. This nurse got 400cc of clear yellow urine with in and out catherization of patient. Patient has consumed liquids well today and this nurse will continue to monitor patients' intake and output.

## 2020-09-24 NOTE — Progress Notes (Signed)
[facility] for Lovenox Indication: VTE prophylaxis  Allergies  Allergen Reactions  . Methotrexate Other (See Comments)    Severe pancytopenia  Severe pancytopenia    . Cymbalta [Duloxetine Hcl] Rash  . Duloxetine Rash    Rash with Itching 08/20/10    Patient Measurements: Height: 5\' 4"  (162.6 cm) Weight: 47.6 kg (105 lb) IBW/kg (Calculated) : 54.7  Vital Signs: Temp: 98.4 F (36.9 C) (10/03 1435) Temp Source: Oral (10/03 1435) BP: 112/70 (10/03 1435) Pulse Rate: 83 (10/03 1435)  Labs: Recent Labs    09/22/20 1156 09/24/20 0533  HGB 8.8* 9.6*  HCT 29.6* 33.1*  PLT 339 210  CREATININE 1.45* 1.27*    Estimated Creatinine Clearance: 23.9 mL/min (A) (by C-G formula based on SCr of 1.27 mg/dL (H)).   Medical History: Past Medical History:  Diagnosis Date  . Arthritis   . Cough    loose  started 05/05/15 no fever  . Dementia ([place])    STARTING SIGNS OF DEMENTIA  . Depression    TAKING ZOLOFT FOR A LONG TIME  . Headache(784.0)   . Hypertension   . Lung mass   . PE (pulmonary thromboembolism) ([place])   . Rheumatoid arthritis [facility])    Assessment: 86 y/o F admitted for falls and confusion is positive for COVID. Patient is elderly, small in stature, and SCr is elevated.   Goal of Therapy:  Monitor platelets by anticoagulation protocol: Yes   Plan:  Will order Lovenox 30 mg daily  Will sign off and follow remotely Thank you for the consult  Napoleon Form 09/24/2020,6:22 PM

## 2020-09-24 NOTE — Progress Notes (Signed)
  Pt tx from [facility], alert x2 skin  dry and intact. No c/o pain. Was unable  to urinate in the ED, bladder scan  shown 300 cc of urine,  Pt's  Bladder was firm to touch. Order received for an in and out cath 600 cc of urine was documented. I will continue to monitor.

## 2020-09-24 NOTE — Progress Notes (Signed)
PROGRESS NOTE    Marcia Lynch  ECX:[id number] DOB: 22-Nov-1931 DOA: 09/22/2020 PCP: Cheron Schaumann., MD     Brief Narrative:  86 y.o. WF PMHx Alzheimer's dementia, RA on chronic steroids, lung mass probably cancer not undergoing further work up or treatment due to age.  Pt has had COVID vaccine(s) or at least 1 of the 2 Moderna's  Pt presented to ED at [facility] on 10/1 with multiple falls and increased confusion.  Unable to ambulate safely in department.  No injuries from fall.  She normally walks with a walker but has had to have more assistance during the night.  Denies recent fevers, N/V, diarrhea, dysuria.  Pt's friend who is also POA and brought her to ED notes: Pt has h/o lung mass, no further treatment or diagnosis as family wouldn't want to pursue due to age.  Concern about abd distention though.  No abd pain.   ED Course: Work up in ED was ultimately only remarkable for a positive COVID test, Tm 99.8.  Had episode of mild hypotension (94/48).  Creat 1.45 is similar to July.  CXR just shows the big lung mass (likely cancer) and a smaller lung nodule (? Met).  CT abd/pelvis just shows a stable 1.8cm pancreatic mass that they rec following up in 2 years.    Subjective: A/O x 1 does not know (where, when, why) pleasant, follows all commands. Negative SOB, negative abdominal pain, nausea, vomiting, diarrhea     Assessment & Plan: Covid vaccination; 1/2 vaccination   Principal Problem:   COVID-19 Active Problems:   Rheumatoid arthritis (HCC)   Pulmonary mass   Hypertension   Anemia   Advanced dementia (HCC)   Generalized muscle weakness   Current chronic use of systemic steroids   COVID-19 virus infection   Generalized weakness   Severe protein-calorie malnutrition (HCC)   Essential hypertension  COVID-19 infection COVID-19 Labs  Recent Labs    09/24/20 0533  DDIMER 2.07*  CRP 7.3*    Lab Results  Component Value Date   SARSCOV2NAA  POSITIVE (A) 09/22/2020  -Most likely incidental infection.  Patient has no respiratory symptoms, no abdominal symptoms. -Doubt Covid plays any significant part in patient's generalized weakness -Remdesivir continue since patient has already received 2 doses. -  Generalized weakness -Multifactorial advanced Alzheimer's dementia, undiagnosed neoplasm, protein calorie malnutrition, possible adrenal insufficiency secondary to chronic steroid use.  Chronic steroid use -May be the cause of mild adrenal insufficiency in the setting of acute illness. -Prednisone 15 mg daily.  We will continue per [facility] patient takes 5 mg x 4 times a week  RA -Will be covered by chronic steroids  Pulmonary mass -Likely lung cancer family does not want further work-up or treatment -Consulted palliative care.  See goals of care  Anemia unspecified -Chronic/stable  Essential HTN -Given patient's age and frailty would let BP run slightly high hold all BP medication  Severe protein calorie malnutrition -Marinol 2.5 mg BID -Encourage patient to consume meals -Ensure TID between meals    Goals of care -10/3 Palliative Care; patient with large mass in chest which family does not want addressed.  Patient admitted for weakness however most likely not due to incidental diagnosis of Covid.  In the setting of a malignancy which family does not want addressed what is the end game?  Home hospice?  Certainly needs to be DNR -Per palliative care family has decided on DNR, and are trying to decide on home with hospice vs residential hospice.  DVT prophylaxis: Lovenox Code Status: DNR Family Communication: 10/3 Dr. Vinetta Bergamo palliative care discussed plan of care with family answered all questions Status is: Inpatient    Dispo: The patient is from: Home              Anticipated d/c is to: Hospice              Anticipated d/c date is:??              Patient currently unstable      Consultants:   Palliative care  Procedures/Significant Events:    I have personally reviewed and interpreted all radiology studies and my findings are as above.  VENTILATOR SETTINGS: Room air 10/3 SPO2 94%   Cultures 10/1 SARS coronavirus positive  Antimicrobials: Anti-infectives (From admission, onward)   Start     Dose/Rate Route Frequency Ordered Stop   09/25/20 0900  sulfamethoxazole-trimethoprim (BACTRIM DS) 800-160 MG per tablet 1 tablet  Status:  Discontinued       Note to Pharmacy: TAKE 1 TABLET BY MOUTH EVERY [place] PCP PROPHYLAXIS DUE TO CHRONIC STEROID USE     1 tablet Oral Once per day on Mon Wed Fri 09/23/20 2325 09/24/20 1630   09/23/20 1000  remdesivir 100 mg in sodium chloride 0.9 % 100 mL IVPB        100 mg 200 mL/hr over 30 Minutes Intravenous Daily 09/22/20 2015 09/27/20 0959   09/22/20 2230  remdesivir 100 mg in sodium chloride 0.9 % 100 mL IVPB       "Followed by" Linked Group Details   100 mg 200 mL/hr over 30 Minutes Intravenous  Once 09/22/20 2015 09/22/20 2312   09/22/20 2200  remdesivir 100 mg in sodium chloride 0.9 % 100 mL IVPB       "Followed by" Linked Group Details   100 mg 200 mL/hr over 30 Minutes Intravenous  Once 09/22/20 2015 09/22/20 2221       Devices    LINES / TUBES:      Continuous Infusions: . sodium chloride Stopped (09/23/20 1055)  . remdesivir 100 mg in NS 100 mL 100 mg (09/24/20 1020)     Objective: Vitals:   09/24/20 0301 09/24/20 0720 09/24/20 1107 09/24/20 1435  BP: 120/66 126/72 113/66 112/70  Pulse: 92 88 82 83  Resp: _0 (!) 24  Temp: 98.9 F (37.2 C) 98.9 F (37.2 C) 98.4 F (36.9 C) 98.4 F (36.9 C)  TempSrc: Oral  Oral Oral  SpO2: 93% 94% 97% 96%  Weight:      Height:        Intake/Output Summary (Last 24 hours) at 09/24/2020 1816 Last data filed at 09/24/2020 1755 Gross per 24 hour  Intake 300 ml  Output 800 ml  Net -500 ml   Filed Weights   09/22/20 1017  Weight: 47.6 kg     Examination:  General: A/O x1 (does not know where, when, why) No acute respiratory distress Eyes: negative scleral hemorrhage, negative anisocoria, negative icterus ENT: Negative Runny nose, negative gingival bleeding, Neck:  Negative scars, masses, torticollis, lymphadenopathy, JVD Lungs: Clear to auscultation bilaterally without wheezes or crackles Cardiovascular: Regular rate and rhythm without murmur gallop or rub normal S1 and S2 Abdomen: negative abdominal pain, nondistended, positive soft, bowel sounds, no rebound, no ascites, no appreciable mass Extremities: No significant cyanosis, clubbing, or edema bilateral lower extremities Skin: Negative rashes, lesions, ulcers Psychiatric: Pleasantly confused Central nervous system:  Cranial nerves II through XII intact, tongue/uvula midline, all extremities muscle strength 5/5, sensation intact throughout,  negative dysarthria, negative expressive aphasia, negative receptive aphasia.  .     Data Reviewed: Care during the described time interval was provided by me .  I have reviewed this patient's available data, including medical history, events of note, physical examination, and all test results as part of my evaluation.  CBC: Recent Labs  Lab 09/22/20 1156 09/24/20 0533  WBC 9.3 5.3  NEUTROABS 7.4 4.0  HGB 8.8* 9.6*  HCT 29.6* 33.1*  MCV 83.4 88.7  PLT 339 210   Basic Metabolic Panel: Recent Labs  Lab 09/22/20 1156 09/24/20 0533  NA 131* 137  K 4.0 3.8  CL 97* 103  CO2 25 23  GLUCOSE 84 103*  BUN 12 25*  CREATININE 1.45* 1.27*  CALCIUM 8.7* 8.7*   GFR: Estimated Creatinine Clearance: 23.9 mL/min (A) (by C-G formula based on SCr of 1.27 mg/dL (H)). Liver Function Tests: Recent Labs  Lab 09/22/20 1156 09/24/20 0533  AST 22 23  ALT 13 14  ALKPHOS 98 89  BILITOT 0.5 0.4  PROT 6.2* 5.9*  ALBUMIN 3.3* 3.0*   No results for input(s): LIPASE, AMYLASE in the last 168 hours. No results for input(s): AMMONIA  in the last 168 hours. Coagulation Profile: No results for input(s): INR, PROTIME in the last 168 hours. Cardiac Enzymes: No results for input(s): CKTOTAL, CKMB, CKMBINDEX, TROPONINI in the last 168 hours. BNP (last 3 results) No results for input(s): PROBNP in the last 8760 hours. HbA1C: No results for input(s): HGBA1C in the last 72 hours. CBG: No results for input(s): GLUCAP in the last 168 hours. Lipid Profile: No results for input(s): CHOL, HDL, LDLCALC, TRIG, CHOLHDL, LDLDIRECT in the last 72 hours. Thyroid Function Tests: No results for input(s): TSH, T4TOTAL, FREET4, T3FREE, THYROIDAB in the last 72 hours. Anemia Panel: No results for input(s): VITAMINB12, FOLATE, FERRITIN, TIBC, IRON, RETICCTPCT in the last 72 hours. Sepsis Labs: No results for input(s): PROCALCITON, LATICACIDVEN in the last 168 hours.  Recent Results (from the past 240 hour(s))  Urine culture     Status: Abnormal   Collection Time: 09/22/20 10:05 AM   Specimen: Urine, Random  Result Value Ref Range Status   Specimen Description   Final    URINE, RANDOM Performed at [facility], [address] [place], [place] [address]    Special Requests   Final    NONE Performed at [facility], [address] [place], [place] [address]    Culture (A)  Final    <10,000 COLONIES/mL INSIGNIFICANT GROWTH Performed at [facility], [address] [place], [place] [address]    Report Status 09/23/2020 FINAL  Final  Respiratory Panel by RT PCR (Flu A&B, Covid) - Nasopharyngeal Swab     Status: Abnormal   Collection Time: 09/22/20  5:26 PM   Specimen: Nasopharyngeal Swab  Result Value Ref Range Status   SARS Coronavirus 2 by RT PCR POSITIVE (A) NEGATIVE Final    Comment: RESULT CALLED TO, READ BACK BY AND VERIFIED WITH: SIMMS M RN [id number] PHILLIPS C (NOTE) SARS-CoV-2 target nucleic acids are DETECTED.  SARS-CoV-2 RNA is generally detectable in upper respiratory specimens   during the acute phase of infection. Positive results are indicative of the presence of the identified virus, but do not rule out bacterial infection or co-infection with other pathogens not detected by the test. Clinical  correlation with patient history and other diagnostic information is necessary to determine patient infection status. The expected result is Negative.  Fact Sheet for Patients:  PinkCheek.be  Fact Sheet for Healthcare Providers: GravelBags.it  This test is not yet approved or cleared by the [place] FDA and  has been authorized for detection and/or diagnosis of SARS-CoV-2 by FDA under an Emergency Use Authorization (EUA).  This EUA will remain in effect (meaning this test can be u sed) for the duration of  the COVID-19 declaration under Section 564(b)(1) of the Act, 21 U.S.C. section 360bbb-3(b)(1), unless the authorization is terminated or revoked sooner.      Influenza A by PCR NEGATIVE NEGATIVE Final   Influenza B by PCR NEGATIVE NEGATIVE Final    Comment: (NOTE) The Xpert Xpress SARS-CoV-2/FLU/RSV assay is intended as an aid in  the diagnosis of influenza from Nasopharyngeal swab specimens and  should not be used as a sole basis for treatment. Nasal washings and  aspirates are unacceptable for Xpert Xpress SARS-CoV-2/FLU/RSV  testing.  Fact Sheet for Patients: PinkCheek.be  Fact Sheet for Healthcare Providers: GravelBags.it  This test is not yet approved or cleared by the [place] FDA and  has been authorized for detection and/or diagnosis of SARS-CoV-2 by  FDA under an Emergency Use Authorization (EUA). This EUA will remain  in effect (meaning this test can be used) for the duration of the  Covid-19 declaration under Section 564(b)(1) of the Act, 21  U.S.C. section 360bbb-3(b)(1), unless the authorization is  terminated or  revoked. Performed at [facility], [address], [place], [place] [address]          Radiology Studies: No results found.      Scheduled Meds: . buPROPion  300 mg Oral QHS  . donepezil  10 mg Oral BID  . memantine  28 mg Oral Daily  . predniSONE  15 mg Oral Q breakfast   Continuous Infusions: . sodium chloride Stopped (09/23/20 1055)  . remdesivir 100 mg in NS 100 mL 100 mg (09/24/20 1020)     LOS: 1 day    Time spent:40 min    Alexandrya Chim, Geraldo Docker, MD [facility] Pager [phone]  If 7PM-7AM, please contact night-coverage [email or web address] Password [facility] 09/24/2020, 6:16 PM

## 2020-09-25 LAB — CBC WITH DIFFERENTIAL/PLATELET
Abs Immature Granulocytes: 0.02 10*3/uL (ref 0.00–0.07)
Basophils Absolute: 0 10*3/uL (ref 0.0–0.1)
Basophils Relative: 0 %
Eosinophils Absolute: 0 10*3/uL (ref 0.0–0.5)
Eosinophils Relative: 0 %
HCT: 30.5 % — ABNORMAL LOW (ref 36.0–46.0)
Hemoglobin: 9.1 g/dL — ABNORMAL LOW (ref 12.0–15.0)
Immature Granulocytes: 1 %
Lymphocytes Relative: 20 %
Lymphs Abs: 0.9 10*3/uL (ref 0.7–4.0)
MCH: 25 pg — ABNORMAL LOW (ref 26.0–34.0)
MCHC: 29.8 g/dL — ABNORMAL LOW (ref 30.0–36.0)
MCV: 83.8 fL (ref 80.0–100.0)
Monocytes Absolute: 0.5 10*3/uL (ref 0.1–1.0)
Monocytes Relative: 11 %
Neutro Abs: 3 10*3/uL (ref 1.7–7.7)
Neutrophils Relative %: 68 %
Platelets: 227 10*3/uL (ref 150–400)
RBC: 3.64 MIL/uL — ABNORMAL LOW (ref 3.87–5.11)
RDW: 18.5 % — ABNORMAL HIGH (ref 11.5–15.5)
WBC: 4.4 10*3/uL (ref 4.0–10.5)
nRBC: 0 % (ref 0.0–0.2)

## 2020-09-25 LAB — COMPREHENSIVE METABOLIC PANEL
ALT: 13 U/L (ref 0–44)
AST: 22 U/L (ref 15–41)
Albumin: 2.8 g/dL — ABNORMAL LOW (ref 3.5–5.0)
Alkaline Phosphatase: 79 U/L (ref 38–126)
Anion gap: 10 (ref 5–15)
BUN: 26 mg/dL — ABNORMAL HIGH (ref 8–23)
CO2: 27 mmol/L (ref 22–32)
Calcium: 9.1 mg/dL (ref 8.9–10.3)
Chloride: 102 mmol/L (ref 98–111)
Creatinine, Ser: 1.15 mg/dL — ABNORMAL HIGH (ref 0.44–1.00)
GFR calc Af Amer: 50 mL/min — ABNORMAL LOW (ref >60)
GFR calc non Af Amer: 43 mL/min — ABNORMAL LOW (ref >60)
Glucose, Bld: 87 mg/dL (ref 70–99)
Potassium: 3.8 mmol/L (ref 3.5–5.1)
Sodium: 139 mmol/L (ref 135–145)
Total Bilirubin: 0.4 mg/dL (ref 0.3–1.2)
Total Protein: 5.6 g/dL — ABNORMAL LOW (ref 6.5–8.1)

## 2020-09-25 LAB — C-REACTIVE PROTEIN: CRP: 4 mg/dL — ABNORMAL HIGH (ref <1.0)

## 2020-09-25 LAB — FERRITIN: Ferritin: 41 ng/mL (ref 11–307)

## 2020-09-25 LAB — LACTATE DEHYDROGENASE: LDH: 137 U/L (ref 98–192)

## 2020-09-25 LAB — D-DIMER, QUANTITATIVE: D-Dimer, Quant: 1.86 ug/mL-FEU — ABNORMAL HIGH (ref 0.00–0.50)

## 2020-09-25 LAB — MAGNESIUM: Magnesium: 2 mg/dL (ref 1.7–2.4)

## 2020-09-25 LAB — PHOSPHORUS: Phosphorus: 3.5 mg/dL (ref 2.5–4.6)

## 2020-09-25 MED ORDER — MELATONIN 3 MG PO TABS
6.0000 mg | ORAL_TABLET | Freq: Every evening | ORAL | Status: DC | PRN
  Administered 2020-09-25: 6 mg via ORAL
  Filled 2020-09-25: qty 2

## 2020-09-25 NOTE — Progress Notes (Signed)
PROGRESS NOTE    Marcia Lynch  TXM:[id number] DOB: 10-09-1931 DOA: 09/22/2020 PCP: Loraine Leriche., MD     Brief Narrative:  86 y.o. WF PMHx Alzheimer's dementia, RA on chronic steroids, lung mass probably cancer not undergoing further work up or treatment due to age.  Pt has had COVID vaccine(s) or at least 1 of the 2 Moderna's  Pt presented to ED at [facility] on 10/1 with multiple falls and increased confusion.  Unable to ambulate safely in department.  No injuries from fall.  She normally walks with a walker but has had to have more assistance during the night.  Denies recent fevers, N/V, diarrhea, dysuria.  Pt's friend who is also POA and brought her to ED notes: Pt has h/o lung mass, no further treatment or diagnosis as family wouldn't want to pursue due to age.  Concern about abd distention though.  No abd pain.   ED Course: Work up in ED was ultimately only remarkable for a positive COVID test, Tm 99.8.  Had episode of mild hypotension (94/48).  Creat 1.45 is similar to July.  CXR just shows the big lung mass (likely cancer) and a smaller lung nodule (? Met).  CT abd/pelvis just shows a stable 1.8cm pancreatic mass that they rec following up in 2 years.    Subjective: 10/4  Afebrile overnight A/O x1 (does not know where, when, why) very pleasant follows all commands.   A/O x 1 does not know (where, when, why) pleasant, follows all commands. Negative SOB, negative abdominal pain, nausea, vomiting, diarrhea     Assessment & Plan: Covid vaccination; 1/2 vaccination   Principal Problem:   COVID-19 Active Problems:   Rheumatoid arthritis ([place])   Pulmonary mass   Hypertension   Anemia   Advanced dementia ([place])   Generalized muscle weakness   Current chronic use of systemic steroids   COVID-19 virus infection   Generalized weakness   Severe protein-calorie malnutrition ([place])   Essential hypertension  COVID-19 infection COVID-19 Labs  Recent Labs     09/24/20 0533 09/25/20 0456  DDIMER 2.07* 1.86*  FERRITIN  --  41  LDH  --  137  CRP 7.3* 4.0*    Lab Results  Component Value Date   SARSCOV2NAA POSITIVE (A) 09/22/2020  -Most likely incidental infection.  Patient has no respiratory symptoms, no abdominal symptoms. -Doubt Covid plays any significant part in patient's generalized weakness -Remdesivir continue since patient has already received 2 doses. -10/4 D/C in Am  -10/4 ADDENDUM; discussed case with Dr. Edmonia James Palliative [facility], she contacted patient's HCPOA and they have Covid so will not be to discharge patient home in A.m. as planned.  Will need to discuss options with NCM in the A.m. short SNF stay?  Generalized weakness -Multifactorial advanced Alzheimer's dementia, undiagnosed neoplasm, protein calorie malnutrition, possible adrenal insufficiency secondary to chronic steroid use.  Chronic steroid use -May be the cause of mild adrenal insufficiency in the setting of acute illness. -Prednisone 15 mg daily.  We will continue per [facility] patient takes 5 mg x 4 times a week  RA -Will be covered by chronic steroids  Pulmonary mass -Likely lung cancer family does not want further work-up or treatment -Consulted palliative care.  See goals of care  Anemia unspecified -Chronic/stable  Essential HTN -Given patient's age and frailty would let BP run slightly high hold all BP medication  Severe protein calorie malnutrition -Marinol 2.5 mg BID -Encourage patient to consume meals -Ensure TID between meals  Goals of care -10/3 Palliative Care; patient with large mass in chest which family does not want addressed.  Patient admitted for weakness however most likely not due to incidental diagnosis of Covid.  In the setting of a malignancy which family does not want addressed what is the end game?  Home hospice?  Certainly needs to be DNR -Per palliative care family has decided on DNR, and are trying to decide on home with  hospice vs residential hospice.   DVT prophylaxis: Lovenox Code Status: DNR Family Communication: 10/3 Dr. Vinetta Bergamo palliative care discussed plan of care with family answered all questions Status is: Inpatient    Dispo: The patient is from: Home              Anticipated d/c is to: Hospice              Anticipated d/c date is:??              Patient currently unstable      Consultants:  Palliative care  Procedures/Significant Events:    I have personally reviewed and interpreted all radiology studies and my findings are as above.  VENTILATOR SETTINGS: Room air 10/4 SPO2 94%   Cultures 10/1 SARS coronavirus positive  Antimicrobials: Anti-infectives (From admission, onward)   Start     Ordered Stop   09/25/20 0900  sulfamethoxazole-trimethoprim (BACTRIM DS) 800-160 MG per tablet 1 tablet  Status:  Discontinued       Note to Pharmacy: TAKE 1 TABLET BY MOUTH EVERY [place] PCP PROPHYLAXIS DUE TO CHRONIC STEROID USE     09/23/20 2325 09/24/20 1630   09/23/20 1000  remdesivir 100 mg in sodium chloride 0.9 % 100 mL IVPB        09/22/20 2015 09/27/20 0959   09/22/20 2230  remdesivir 100 mg in sodium chloride 0.9 % 100 mL IVPB       "Followed by" Linked Group Details   09/22/20 2015 09/22/20 2312   09/22/20 2200  remdesivir 100 mg in sodium chloride 0.9 % 100 mL IVPB       "Followed by" Linked Group Details   09/22/20 2015 09/22/20 2221       Devices    LINES / TUBES:      Continuous Infusions: . sodium chloride Stopped (09/23/20 1055)  . remdesivir 100 mg in NS 100 mL 100 mg (09/24/20 1020)     Objective: Vitals:   09/24/20 0720 09/24/20 1107 09/24/20 1435 09/24/20 2024  BP: 126/72 113/66 112/70 110/63  Pulse: 88 82 83 75  Resp: 18 20 (!) 24 (!) 23  Temp: 98.9 F (37.2 C) 98.4 F (36.9 C) 98.4 F (36.9 C) 98.9 F (37.2 C)  TempSrc:  Oral Oral   SpO2: 94% 97% 96% 97%  Weight:      Height:        Intake/Output  Summary (Last 24 hours) at 09/25/2020 1336 Last data filed at 09/25/2020 1111 Gross per 24 hour  Intake 270 ml  Output 200 ml  Net 70 ml   Filed Weights   09/22/20 1017  Weight: 47.6 kg    Examination:  General: A/O x1 (does not know where, when, why) No acute respiratory distress Eyes: negative scleral hemorrhage, negative anisocoria, negative icterus ENT: Negative Runny nose, negative gingival bleeding, Neck:  Negative scars, masses, torticollis, lymphadenopathy, JVD Lungs: Clear to auscultation bilaterally without wheezes or crackles Cardiovascular: Regular rate and rhythm without murmur gallop or rub normal  S1 and S2 Abdomen: negative abdominal pain, nondistended, positive soft, bowel sounds, no rebound, no ascites, no appreciable mass Extremities: No significant cyanosis, clubbing, or edema bilateral lower extremities Skin: Negative rashes, lesions, ulcers Psychiatric: Pleasantly confused Central nervous system:  Cranial nerves II through XII intact, tongue/uvula midline, all extremities muscle strength 5/5, sensation intact throughout,  negative dysarthria, negative expressive aphasia, negative receptive aphasia.  .     Data Reviewed: Care during the described time interval was provided by me .  I have reviewed this patient's available data, including medical history, events of note, physical examination, and all test results as part of my evaluation.  CBC: Recent Labs  Lab 09/22/20 1156 09/24/20 0533 09/25/20 0456  WBC 9.3 5.3 4.4  NEUTROABS 7.4 4.0 3.0  HGB 8.8* 9.6* 9.1*  HCT 29.6* 33.1* 30.5*  MCV 83.4 88.7 83.8  PLT 339 210 371   Basic Metabolic Panel: Recent Labs  Lab 09/22/20 1156 09/24/20 0533 09/25/20 0456  NA 131* 137 139  K 4.0 3.8 3.8  CL 97* 103 102  CO2 _0 GLUCOSE 84 103* 87  BUN 12 25* 26*  CREATININE 1.45* 1.27* 1.15*  CALCIUM 8.7* 8.7* 9.1  MG  --   --  2.0  PHOS  --   --  3.5   GFR: Estimated Creatinine Clearance: 26.4 mL/min  (A) (by C-G formula based on SCr of 1.15 mg/dL (H)). Liver Function Tests: Recent Labs  Lab 09/22/20 1156 09/24/20 0533 09/25/20 0456  AST _1 ALT _2 ALKPHOS 98 89 79  BILITOT 0.5 0.4 0.4  PROT 6.2* 5.9* 5.6*  ALBUMIN 3.3* 3.0* 2.8*   No results for input(s): LIPASE, AMYLASE in the last 168 hours. No results for input(s): AMMONIA in the last 168 hours. Coagulation Profile: No results for input(s): INR, PROTIME in the last 168 hours. Cardiac Enzymes: No results for input(s): CKTOTAL, CKMB, CKMBINDEX, TROPONINI in the last 168 hours. BNP (last 3 results) No results for input(s): PROBNP in the last 8760 hours. HbA1C: No results for input(s): HGBA1C in the last 72 hours. CBG: No results for input(s): GLUCAP in the last 168 hours. Lipid Profile: No results for input(s): CHOL, HDL, LDLCALC, TRIG, CHOLHDL, LDLDIRECT in the last 72 hours. Thyroid Function Tests: No results for input(s): TSH, T4TOTAL, FREET4, T3FREE, THYROIDAB in the last 72 hours. Anemia Panel: Recent Labs    09/25/20 0456  FERRITIN 41   Sepsis Labs: No results for input(s): PROCALCITON, LATICACIDVEN in the last 168 hours.  Recent Results (from the past 240 hour(s))  Urine culture     Status: Abnormal   Collection Time: 09/22/20 10:05 AM   Specimen: Urine, Random  Result Value Ref Range Status   Specimen Description   Final    URINE, RANDOM Performed at [facility], [address] [place], [place] [address]    Special Requests   Final    NONE Performed at [facility], [address] [place], [place] [address]    Culture (A)  Final    <10,000 COLONIES/mL INSIGNIFICANT GROWTH Performed at [facility], [place] [address], [place], [place] [address]    Report Status 09/23/2020 FINAL  Final  Respiratory Panel by RT PCR (Flu A&B, Covid) - Nasopharyngeal Swab     Status: Abnormal   Collection Time: 09/22/20  5:26 PM   Specimen: Nasopharyngeal Swab  Result Value  Ref Range Status   SARS  Coronavirus 2 by RT PCR POSITIVE (A) NEGATIVE Final    Comment: RESULT CALLED TO, READ BACK BY AND VERIFIED WITH: SIMMS M RN [id number] PHILLIPS C (NOTE) SARS-CoV-2 target nucleic acids are DETECTED.  SARS-CoV-2 RNA is generally detectable in upper respiratory specimens  during the acute phase of infection. Positive results are indicative of the presence of the identified virus, but do not rule out bacterial infection or co-infection with other pathogens not detected by the test. Clinical correlation with patient history and other diagnostic information is necessary to determine patient infection status. The expected result is Negative.  Fact Sheet for Patients:  PinkCheek.be  Fact Sheet for Healthcare Providers: GravelBags.it  This test is not yet approved or cleared by the [place] FDA and  has been authorized for detection and/or diagnosis of SARS-CoV-2 by FDA under an Emergency Use Authorization (EUA).  This EUA will remain in effect (meaning this test can be u sed) for the duration of  the COVID-19 declaration under Section 564(b)(1) of the Act, 21 U.S.C. section 360bbb-3(b)(1), unless the authorization is terminated or revoked sooner.      Influenza A by PCR NEGATIVE NEGATIVE Final   Influenza B by PCR NEGATIVE NEGATIVE Final    Comment: (NOTE) The Xpert Xpress SARS-CoV-2/FLU/RSV assay is intended as an aid in  the diagnosis of influenza from Nasopharyngeal swab specimens and  should not be used as a sole basis for treatment. Nasal washings and  aspirates are unacceptable for Xpert Xpress SARS-CoV-2/FLU/RSV  testing.  Fact Sheet for Patients: PinkCheek.be  Fact Sheet for Healthcare Providers: GravelBags.it  This test is not yet approved or cleared by the [place] FDA and  has been authorized for detection and/or  diagnosis of SARS-CoV-2 by  FDA under an Emergency Use Authorization (EUA). This EUA will remain  in effect (meaning this test can be used) for the duration of the  Covid-19 declaration under Section 564(b)(1) of the Act, 21  U.S.C. section 360bbb-3(b)(1), unless the authorization is  terminated or revoked. Performed at [facility], [address], [place], [place] [address]          Radiology Studies: No results found.      Scheduled Meds: . buPROPion  300 mg Oral QHS  . Chlorhexidine Gluconate Cloth  6 each Topical Daily  . donepezil  10 mg Oral BID  . enoxaparin (LOVENOX) injection  30 mg Subcutaneous Q24H  . memantine  28 mg Oral Daily  . predniSONE  15 mg Oral Q breakfast   Continuous Infusions: . sodium chloride Stopped (09/23/20 1055)  . remdesivir 100 mg in NS 100 mL 100 mg (09/24/20 1020)     LOS: 2 days    Time spent:40 min    Donielle Radziewicz, Geraldo Docker, MD [facility] Pager [phone]  If 7PM-7AM, please contact night-coverage [email or web address] Password [email or web address] 09/25/2020, 1:36 PM

## 2020-09-25 NOTE — Progress Notes (Signed)
PMT no charge note  Call placed and discussed with patient's healthcare power of attorney agent Ms. Johnson. She has tested positive for COVID-19. She is scheduled to take antibody infusion as an outpatient tomorrow on 10-4. She is having mild symptoms. She does not believe that she can take care of the patient at home at least for the next few days to a week or so. I provided her with an update about the patient's current condition. All of her questions answered to the best of my ability. Recommend physical therapy evaluation, recommend consideration for a brief course of short-term rehab for the patient and then for home with hospice after that.  Loistine Chance MD [place] palliative [phone].

## 2020-09-25 NOTE — Progress Notes (Signed)
Daily Progress Note   Patient Name: Marcia Lynch       Date: 09/25/2020 DOB: Oct 01, 1931  Age: 86 y.o. MRN#: [id number] Attending Physician: Allie Bossier, MD Primary Care Physician: Loraine Leriche., MD Admit Date: 09/22/2020  Reason for Consultation/Follow-up: Establishing goals of care  Subjective:  awake alert resting in bed Ate about 40% of her breakfast Denies pain denies shortness of breath.   Length of Stay: 2  Current Medications: Scheduled Meds:  . buPROPion  300 mg Oral QHS  . Chlorhexidine Gluconate Cloth  6 each Topical Daily  . donepezil  10 mg Oral BID  . enoxaparin (LOVENOX) injection  30 mg Subcutaneous Q24H  . memantine  28 mg Oral Daily  . predniSONE  15 mg Oral Q breakfast    Continuous Infusions: . sodium chloride Stopped (09/23/20 1055)  . remdesivir 100 mg in NS 100 mL 100 mg (09/24/20 1020)    PRN Meds: sodium chloride, acetaminophen, ondansetron **OR** ondansetron (ZOFRAN) IV  Physical Exam         Awake alert No distress Regular work of breathing S1 S 2 Abdomen not distended Has dementia Has some frailty  Vital Signs: BP 110/63 (BP Location: Left Arm)   Pulse 75   Temp 98.9 F (37.2 C)   Resp (!) 23   Ht 5\' 4"  (1.626 m)   Wt 47.6 kg   SpO2 97%   BMI 18.02 kg/m  SpO2: SpO2: 97 % O2 Device: O2 Device: Nasal Cannula O2 Flow Rate: O2 Flow Rate (L/min): 1 L/min  Intake/output summary:   Intake/Output Summary (Last 24 hours) at 09/25/2020 1058 Last data filed at 09/24/2020 2200 Gross per 24 hour  Intake 210 ml  Output --  Net 210 ml   LBM:   Baseline Weight: Weight: 47.6 kg Most recent weight: Weight: 47.6 kg       Palliative Assessment/Data:      Patient Active Problem List   Diagnosis Date Noted  . COVID-19  virus infection 09/24/2020  . Generalized weakness 09/24/2020  . Severe protein-calorie malnutrition ([place]) 09/24/2020  . Essential hypertension 09/24/2020  . Current chronic use of systemic steroids 09/23/2020  . Generalized muscle weakness 09/22/2020  . COVID-19 09/22/2020  . Anemia 02/19/2020  . Angiodysplasia of stomach 02/19/2020  . Advanced dementia ([place]) 02/19/2020  .  Hypotension 02/11/2020  . Pulmonary embolism ([place]) 02/11/2020  . Pulmonary mass 02/11/2020  . Hypertension 02/11/2020  . GERD (gastroesophageal reflux disease) 02/11/2020  . Fracture, proximal femur ([place]) 12/19/2019  . History of total left hip arthroplasty 12/19/2019  . Radius distal fracture 12/19/2019  . Pulmonary nodules/lesions, multiple 12/19/2019  . Acute respiratory failure with hypoxia ([place]) 12/19/2019  . Rheumatoid arthritis ([place]) 12/19/2019  . Hyperlipidemia 12/19/2019  . Depression 12/19/2019  . HNP (herniated nucleus pulposus), cervical 05/10/2015    Palliative Care Assessment & Plan   Patient Profile:  86 year old female with a history of dementia, history of rheumatoid arthritis on chronic steroids, history of having a lung mass probably cancer has not had further work-up or treatment due to age.  Patient initially presented to emergency department at [facility] with multiple falls and increased confusion.  Work-up in the ED was found to have a positive Covid test.  She was transferred to [facility].  She does not require supplemental oxygen at the moment, she has been started on remdesivir, she is on steroids.  A palliative medicine consultation has been requested for CODE STATUS and goals of care discussions.  Assessment:  COVID infection Generalized weakness functional decline Chronic steroid use, has RA Pulmonary mass likely lung cancer, has not had work up.    Recommendations/Plan: DNR Home with hospice towards the end of this hospitalization.  No new  PMT specific recommendations at this time. Discussed with TOC.  Please call us if we can be of further assistance.     Code Status:    Code Status Orders  (From admission, onward)         Start     Ordered   09/24/20 1402  Do not attempt resuscitation (DNR)  Continuous       Question Answer Comment  In the event of cardiac or respiratory ARREST Do not call a "code blue"   In the event of cardiac or respiratory ARREST Do not perform Intubation, CPR, defibrillation or ACLS   In the event of cardiac or respiratory ARREST Use medication by any route, position, wound care, and other measures to relive pain and suffering. May use oxygen, suction and manual treatment of airway obstruction as needed for comfort.      09/24/20 1401        Code Status History    Date Active Date Inactive Code Status Order ID Comments User Context   09/23/2020 2354 09/24/2020 1401 Full Code [id number]  Etta Quill, DO Inpatient   05/10/2015 1628 05/11/2015 2214 Full Code [id number]  Kary Kos, MD Inpatient   09/28/2013 1345 10/04/2013 2050 Full Code [id number]  Elaina Hoops, MD Inpatient   Advance Care Planning Activity    Advance Directive Documentation     Most Recent Value  Type of Advance Directive Healthcare Power of Attorney, Living will  Pre-existing out of facility DNR order (yellow form or pink MOST form) --  "MOST" Form in Place? --      Prognosis:  < 6 months  Discharge Planning: Home with Hospice  Care plan was discussed with  Patient, TOC.   Thank you for allowing the Palliative Medicine Team to assist in the care of this patient.   Time In: 10 Time Out: 10.25 Total Time 25 Prolonged Time Billed No       Greater than 50%  of this time was spent counseling and coordinating care related to the above assessment and plan.  Loistine Chance, MD  Please contact Palliative Medicine Team phone at [phone] for questions and concerns.

## 2020-09-26 LAB — CBC WITH DIFFERENTIAL/PLATELET
Abs Immature Granulocytes: 0.01 10*3/uL (ref 0.00–0.07)
Basophils Absolute: 0 10*3/uL (ref 0.0–0.1)
Basophils Relative: 0 %
Eosinophils Absolute: 0 10*3/uL (ref 0.0–0.5)
Eosinophils Relative: 0 %
HCT: 28.9 % — ABNORMAL LOW (ref 36.0–46.0)
Hemoglobin: 8.7 g/dL — ABNORMAL LOW (ref 12.0–15.0)
Immature Granulocytes: 0 %
Lymphocytes Relative: 27 %
Lymphs Abs: 1 10*3/uL (ref 0.7–4.0)
MCH: 25.2 pg — ABNORMAL LOW (ref 26.0–34.0)
MCHC: 30.1 g/dL (ref 30.0–36.0)
MCV: 83.8 fL (ref 80.0–100.0)
Monocytes Absolute: 0.5 10*3/uL (ref 0.1–1.0)
Monocytes Relative: 13 %
Neutro Abs: 2.2 10*3/uL (ref 1.7–7.7)
Neutrophils Relative %: 60 %
Platelets: 265 10*3/uL (ref 150–400)
RBC: 3.45 MIL/uL — ABNORMAL LOW (ref 3.87–5.11)
RDW: 18 % — ABNORMAL HIGH (ref 11.5–15.5)
WBC: 3.6 10*3/uL — ABNORMAL LOW (ref 4.0–10.5)
nRBC: 0 % (ref 0.0–0.2)

## 2020-09-26 LAB — MAGNESIUM: Magnesium: 2 mg/dL (ref 1.7–2.4)

## 2020-09-26 LAB — COMPREHENSIVE METABOLIC PANEL
ALT: 14 U/L (ref 0–44)
AST: 21 U/L (ref 15–41)
Albumin: 3.1 g/dL — ABNORMAL LOW (ref 3.5–5.0)
Alkaline Phosphatase: 81 U/L (ref 38–126)
Anion gap: 9 (ref 5–15)
BUN: 23 mg/dL (ref 8–23)
CO2: 27 mmol/L (ref 22–32)
Calcium: 9.2 mg/dL (ref 8.9–10.3)
Chloride: 102 mmol/L (ref 98–111)
Creatinine, Ser: 1.16 mg/dL — ABNORMAL HIGH (ref 0.44–1.00)
GFR calc Af Amer: 49 mL/min — ABNORMAL LOW (ref >60)
GFR calc non Af Amer: 43 mL/min — ABNORMAL LOW (ref >60)
Glucose, Bld: 94 mg/dL (ref 70–99)
Potassium: 4.2 mmol/L (ref 3.5–5.1)
Sodium: 138 mmol/L (ref 135–145)
Total Bilirubin: 0.5 mg/dL (ref 0.3–1.2)
Total Protein: 5.7 g/dL — ABNORMAL LOW (ref 6.5–8.1)

## 2020-09-26 LAB — C-REACTIVE PROTEIN: CRP: 3.8 mg/dL — ABNORMAL HIGH (ref <1.0)

## 2020-09-26 LAB — D-DIMER, QUANTITATIVE: D-Dimer, Quant: 1.71 ug/mL-FEU — ABNORMAL HIGH (ref 0.00–0.50)

## 2020-09-26 LAB — LACTATE DEHYDROGENASE: LDH: 144 U/L (ref 98–192)

## 2020-09-26 LAB — PHOSPHORUS: Phosphorus: 3.5 mg/dL (ref 2.5–4.6)

## 2020-09-26 LAB — FERRITIN: Ferritin: 36 ng/mL (ref 11–307)

## 2020-09-26 NOTE — Evaluation (Signed)
Physical Therapy Evaluation Patient Details Name: Marcia Lynch MRN: [id number] DOB: [birth year] Today's Date: 09/26/2020   History of Present Illness  86 y.o. female with medical history significant of Alzheimer's dementia, RA on chronic steroids, lung mass probably cancer not undergoing further work up or treatment due to age. Pt admitted with falls, increased confusion, covid.  Clinical Impression  Pt admitted with above diagnosis. Pt ambulated 160' with RW with supervision, no loss of balance. SaO2 95% on room air at rest, 91% on room air while ambulating. Per chart, pt has had falls recently and her caregiver is sick with covid. Due to h/o recent falls and dementia, 24* supervision is recommended. SNF recommended if pt lacks 24* assistance at home. Pt is oriented to self, and knows she's in the hospital, but doesn't know why.  Pt currently with functional limitations due to the deficits listed below (see PT Problem List). Pt will benefit from skilled PT to increase their independence and safety with mobility to allow discharge to the venue listed below.       Follow Up Recommendations SNF;Supervision for mobility/OOB;Supervision/Assistance - 24 hour    Equipment Recommendations  None recommended by PT    Recommendations for Other Services       Precautions / Restrictions Precautions Precautions: Fall Precaution Comments: falls per chart, pt denied falls in past few months but has h/o dementia Restrictions Weight Bearing Restrictions: No      Mobility  Bed Mobility Overal bed mobility: Modified Independent                Transfers Overall transfer level: Modified independent                  Ambulation/Gait Ambulation/Gait assistance: Supervision Gait Distance (Feet): 160 Feet Assistive device: Rolling walker (2 wheeled) Gait Pattern/deviations: Step-through pattern;Decreased stride length Gait velocity: WNL   General Gait Details: steady with RW, no loss  of balance, SpO2 91% on room air walking, some mild wheezing noted, HR 90s walking  Stairs            Wheelchair Mobility    Modified Rankin (Stroke Patients Only)       Balance Overall balance assessment: History of Falls;Needs assistance Sitting-balance support: Feet supported;No upper extremity supported Sitting balance-Leahy Scale: Good     Standing balance support: Bilateral upper extremity supported Standing balance-Leahy Scale: Fair Standing balance comment: steady walking with RW                             Pertinent Vitals/Pain Pain Assessment: No/denies pain    Home Living Family/patient expects to be discharged to:: Private residence Living Arrangements: Non-relatives/Friends     Home Access: Stairs to enter   Technical brewer of Steps: 4 Home Layout: Two level;Able to live on main level with bedroom/bathroom Home Equipment: Walker - 2 wheels      Prior Function Level of Independence: Independent with assistive device(s)         Comments: lives with Karle Plumber, pt stated Neoma Laming works during the day, pt walks with a walker, denies falls (H&P reported some recent falls), pt reports she bathes/dresses herself and does some cooking, Neoma Laming does grocery shopping, all info would need to be confirmed by caregiver as pt has dementia     Hand Dominance        Extremity/Trunk Assessment   Upper Extremity Assessment Upper Extremity Assessment: Overall WFL for tasks assessed  Lower Extremity Assessment Lower Extremity Assessment: Overall WFL for tasks assessed    Cervical / Trunk Assessment Cervical / Trunk Assessment: Normal  Communication   Communication: No difficulties  Cognition Arousal/Alertness: Awake/alert Behavior During Therapy: WFL for tasks assessed/performed Overall Cognitive Status: No family/caregiver present to determine baseline cognitive functioning                                  General Comments: oriented to self, knows she's in a hospital, doesn't know reason for hospitalization,  thinks she's in [place] (which is where she lives), can follow commands, is pleasant      General Comments General comments (skin integrity, edema, etc.): SaO2 95% on room air at rest, 91% on room air walking    Exercises     Assessment/Plan    PT Assessment Patient needs continued PT services  PT Problem List Decreased cognition;Decreased activity tolerance;Decreased balance       PT Treatment Interventions      PT Goals (Current goals can be found in the Care Plan section)  Acute Rehab PT Goals Patient Stated Goal: likes to watch tv PT Goal Formulation: With patient Time For Goal Achievement: 10/10/20 Potential to Achieve Goals: Good    Frequency Min 3X/week   Barriers to discharge        Co-evaluation               AM-PAC PT "6 Clicks" Mobility  Outcome Measure Help needed turning from your back to your side while in a flat bed without using bedrails?: None Help needed moving from lying on your back to sitting on the side of a flat bed without using bedrails?: A Little Help needed moving to and from a bed to a chair (including a wheelchair)?: A Little Help needed standing up from a chair using your arms (e.g., wheelchair or bedside chair)?: A Little Help needed to walk in hospital room?: A Little Help needed climbing 3-5 steps with a railing? : A Little 6 Click Score: 19    End of Session Equipment Utilized During Treatment: Gait belt Activity Tolerance: Patient tolerated treatment well Patient left: in chair;with call bell/phone within reach;with chair alarm set Nurse Communication: Mobility status PT Visit Diagnosis: History of falling (Z91.81)    Time: 4970-2637 PT Time Calculation (min) (ACUTE ONLY): 20 min   Charges:   PT Evaluation $PT Eval Low Complexity: 1 Low         Philomena Doheny PT 09/26/2020  Acute Rehabilitation  Services Pager [phone] Office [phone]

## 2020-09-26 NOTE — NC FL2 (Signed)
[place] LEVEL OF CARE SCREENING TOOL     IDENTIFICATION  Patient Name: Tangelia Sanson Birthdate: 09-17-1931 Sex: female Admission Date (Current Location): 09/22/2020  [facility] and [place] Number:  Herbalist and Address:  [facility],  [place], [place]      Provider Number: [id number]  Attending Physician Name and Address:  Allie Bossier, MD  Relative Name and Phone Number:  Theador Hawthorne" [facility]) [phone]    Current Level of Care: Hospital Recommended Level of Care: [place] Prior Approval Number:    Date Approved/Denied:   PASRR Number: [id number]  Discharge Plan: SNF    Current Diagnoses: Patient Active Problem List   Diagnosis Date Noted   COVID-19 virus infection 09/24/2020   Generalized weakness 09/24/2020   Severe protein-calorie malnutrition ([place]) 09/24/2020   Essential hypertension 09/24/2020   Current chronic use of systemic steroids 09/23/2020   Generalized muscle weakness 09/22/2020   COVID-19 09/22/2020   Anemia 02/19/2020   Angiodysplasia of stomach 02/19/2020   Advanced dementia ([place]) 02/19/2020   Hypotension 02/11/2020   Pulmonary embolism ([place]) 02/11/2020   Pulmonary mass 02/11/2020   Hypertension 02/11/2020   GERD (gastroesophageal reflux disease) 02/11/2020   Fracture, proximal femur ([place]) 12/19/2019   History of total left hip arthroplasty 12/19/2019   Radius distal fracture 12/19/2019   Pulmonary nodules/lesions, multiple 12/19/2019   Acute respiratory failure with hypoxia ([place]) 12/19/2019   Rheumatoid arthritis ([place]) 12/19/2019   Hyperlipidemia 12/19/2019   Depression 12/19/2019   HNP (herniated nucleus pulposus), cervical 05/10/2015    Orientation RESPIRATION BLADDER Height & Weight     Self  O2 (2L [place]) Indwelling catheter Weight: 47.6 kg Height:  5\' 4"  (162.6 cm)  BEHAVIORAL SYMPTOMS/MOOD NEUROLOGICAL BOWEL NUTRITION  STATUS   (none)  (none) Continent Diet (see d/c summary)  AMBULATORY STATUS COMMUNICATION OF NEEDS Skin   Extensive Assist Verbally Normal                       Personal Care Assistance Level of Assistance  Bathing, Feeding, Dressing Bathing Assistance: Maximum assistance Feeding assistance: Independent Dressing Assistance: Limited assistance     Functional Limitations Info  Sight, Hearing, Speech Sight Info: Adequate Hearing Info: Adequate      SPECIAL CARE FACTORS FREQUENCY  PT (By licensed PT)     PT Frequency: 5X/W              Contractures Contractures Info: Not present    Additional Factors Info  Code Status, Allergies Code Status Info: DNR Allergies Info: Cymbalta, Duloxetine, Methotrexate           Current Medications (09/26/2020):  This is the current hospital active medication list Current Facility-Administered Medications  Medication Dose Route Frequency Provider Last Rate Last Admin   0.9 %  sodium chloride infusion   Intravenous PRN Dwyane Dee, MD   Stopped at 09/23/20 1055   acetaminophen (TYLENOL) tablet 650 mg  650 mg Oral Q6H PRN Etta Quill, DO       buPROPion (WELLBUTRIN XL) 24 hr tablet 300 mg  300 mg Oral QHS Hayden Rasmussen, MD   300 mg at 09/25/20 2335   Chlorhexidine Gluconate Cloth 2 % PADS 6 each  6 each Topical Daily Allie Bossier, MD   6 each at 09/26/20 0834   donepezil (ARICEPT) tablet 10 mg  10 mg Oral BID Hayden Rasmussen, MD   10 mg at 09/26/20 [phone]  enoxaparin (LOVENOX) injection 30 mg  30 mg Subcutaneous Q24H Allie Bossier, MD   30 mg at 09/26/20 6767   melatonin tablet 6 mg  6 mg Oral QHS PRN Lang Snow, FNP   6 mg at 09/25/20 2336   memantine (NAMENDA XR) 24 hr capsule 28 mg  28 mg Oral Daily Hayden Rasmussen, MD   28 mg at 09/26/20 0833   ondansetron (ZOFRAN) tablet 4 mg  4 mg Oral Q6H PRN Etta Quill, DO       Or   ondansetron [facility]) injection 4 mg  4 mg Intravenous Q6H PRN Etta Quill, DO       predniSONE (DELTASONE) tablet 15 mg  15 mg Oral Q breakfast Jennette Kettle M, DO   15 mg at 09/26/20 2094     Discharge Medications: Please see discharge summary for a list of discharge medications.  Relevant Imaging Results:  Relevant Lab Results:   Additional Information [phone]  Trish Mage, [place]

## 2020-09-26 NOTE — Care Management Important Message (Signed)
Important Message  Patient Details IM Letter given to the Patient Name: Marcia Lynch MRN: [id number] Date of Birth: [birth year]   Medicare Important Message Given:  Yes     Kerin Salen 09/26/2020, 10:29 AM

## 2020-09-26 NOTE — Progress Notes (Signed)
[facility]: [facility]    I have discussed goals and plan with Rivka Safer for hospice care. She confirms interest in hospice care once pt comes home. She also states that pt may need to go to a SNF for rehab until she is able to care for the pt due to having Covid diagnosis. She is getting IV infusions in outpt setting for this. She is aware that if pt uses skilled days at SNF that hospice will not be able to provide care during this time. However when pt ready for d/c from SNF back to her home we can come in at that time. I have also updated if pt ends up being long term resident after skilled days used hospice can come into the facility to offer additional support with comfort end of life care.  She is accepting of hospice care and we will follow until known disposition. She has been approved for hospice care by our medical director. Webb Silversmith RN [phone]

## 2020-09-26 NOTE — Progress Notes (Signed)
PROGRESS NOTE    Marcia Lynch  KNL:[id number] DOB: 1931-08-08 DOA: 09/22/2020 PCP: Loraine Leriche., MD     Brief Narrative:  86 y.o. WF PMHx Alzheimer's dementia, RA on chronic steroids, lung mass probably cancer not undergoing further work up or treatment due to age.  Pt has had COVID vaccine(s) or at least 1 of the 2 Moderna's  Pt presented to ED at [facility] on 10/1 with multiple falls and increased confusion.  Unable to ambulate safely in department.  No injuries from fall.  She normally walks with a walker but has had to have more assistance during the night.  Denies recent fevers, N/V, diarrhea, dysuria.  Pt's friend who is also POA and brought her to ED notes: Pt has h/o lung mass, no further treatment or diagnosis as family wouldn't want to pursue due to age.  Concern about abd distention though.  No abd pain.   ED Course: Work up in ED was ultimately only remarkable for a positive COVID test, Tm 99.8.  Had episode of mild hypotension (94/48).  Creat 1.45 is similar to July.  CXR just shows the big lung mass (likely cancer) and a smaller lung nodule (? Met).  CT abd/pelvis just shows a stable 1.8cm pancreatic mass that they rec following up in 2 years.    Subjective: 10/5 afebrile overnight A/O x1 (does not know where, when, why) pleasantly confused.  Assessment & Plan: Covid vaccination; 1/2 vaccination   Principal Problem:   COVID-19 Active Problems:   Rheumatoid arthritis ([place])   Pulmonary mass   Hypertension   Anemia   Advanced dementia ([place])   Generalized muscle weakness   Current chronic use of systemic steroids   COVID-19 virus infection   Generalized weakness   Severe protein-calorie malnutrition ([place])   Essential hypertension  COVID-19 infection COVID-19 Labs  Recent Labs    09/24/20 0533 09/25/20 0456 09/26/20 0514  DDIMER 2.07* 1.86* 1.71*  FERRITIN  --  41 36  LDH  --  137 144  CRP 7.3* 4.0* 3.8*    Lab Results   Component Value Date   SARSCOV2NAA POSITIVE (A) 09/22/2020  -Most likely incidental infection.  Patient has no respiratory symptoms, no abdominal symptoms. -Doubt Covid plays any significant part in patient's generalized weakness -Remdesivir continue since patient has already received 2 doses. -10/4 D/C in Am  -10/4 ADDENDUM; discussed case with Dr. Edmonia James Palliative Care, she contacted patient's HCPOA and they have Covid so will not be to discharge patient home in A.m. as planned.  Will need to discuss options with NCM in the A.m. short SNF stay?  Generalized weakness -Multifactorial advanced Alzheimer's dementia, undiagnosed neoplasm, protein calorie malnutrition, possible adrenal insufficiency secondary to chronic steroid use.  Chronic steroid use -May be the cause of mild adrenal insufficiency in the setting of acute illness. -Prednisone 15 mg daily.  We will continue per [facility] patient takes 5 mg x 4 times a week  RA -Will be covered by chronic steroids  Pulmonary mass -Likely lung cancer family does not want further work-up or treatment -Consulted palliative care.  See goals of care  Anemia unspecified -Chronic/stable  Essential HTN -Given patient's age and frailty would let BP run slightly high hold all BP medication  Severe protein calorie malnutrition -Marinol 2.5 mg BID -Encourage patient to consume meals -Ensure TID between meals    Goals of care -10/3 Palliative Care; patient with large mass in chest which family does not want addressed.  Patient admitted  for weakness however most likely not due to incidental diagnosis of Covid.  In the setting of a malignancy which family does not want addressed what is the end game?  Home hospice?  Certainly needs to be DNR -Per palliative care family has decided on DNR, and are trying to decide on home with hospice vs residential hospice. -10/15 per NCM Roque Lias request placed PT request in order to qualify patient for  hospice.   DVT prophylaxis: Lovenox Code Status: DNR Family Communication: 10/3 Dr. Vinetta Bergamo palliative care discussed plan of care with family answered all questions Status is: Inpatient    Dispo: The patient is from: Home              Anticipated d/c is to: Hospice              Anticipated d/c date is:??              Patient currently unstable      Consultants:  Palliative care  Procedures/Significant Events:    I have personally reviewed and interpreted all radiology studies and my findings are as above.  VENTILATOR SETTINGS: Room air 10/4 SPO2 94%   Cultures 10/1 SARS coronavirus positive  Antimicrobials: Anti-infectives (From admission, onward)   Start     Ordered Stop   09/25/20 0900  sulfamethoxazole-trimethoprim (BACTRIM DS) 800-160 MG per tablet 1 tablet  Status:  Discontinued       Note to Pharmacy: TAKE 1 TABLET BY MOUTH EVERY [place] PCP PROPHYLAXIS DUE TO CHRONIC STEROID USE     09/23/20 2325 09/24/20 1630   09/23/20 1000  remdesivir 100 mg in sodium chloride 0.9 % 100 mL IVPB        09/22/20 2015 09/27/20 0959   09/22/20 2230  remdesivir 100 mg in sodium chloride 0.9 % 100 mL IVPB       "Followed by" Linked Group Details   09/22/20 2015 09/22/20 2312   09/22/20 2200  remdesivir 100 mg in sodium chloride 0.9 % 100 mL IVPB       "Followed by" Linked Group Details   09/22/20 2015 09/22/20 2221       Devices    LINES / TUBES:      Continuous Infusions: . sodium chloride Stopped (09/23/20 1055)     Objective: Vitals:   09/25/20 1404 09/25/20 2133 09/26/20 0551 09/26/20 1333  BP: 119/69 135/65 134/81 123/71  Pulse: 80 73 76 85  Resp: _0 Temp: (!) 97.4 F (36.3 C)  97.7 F (36.5 C) 98.1 F (36.7 C)  TempSrc: Oral  Oral Oral  SpO2:  98% 98% 94%  Weight:      Height:        Intake/Output Summary (Last 24 hours) at 09/26/2020 1626 Last data filed at 09/26/2020 1459 Gross per 24 hour  Intake  948 ml  Output 1550 ml  Net -602 ml   Filed Weights   09/22/20 1017  Weight: 47.6 kg    Examination:  General: A/O x1 (does not know where, when, why) No acute respiratory distress Eyes: negative scleral hemorrhage, negative anisocoria, negative icterus ENT: Negative Runny nose, negative gingival bleeding, Neck:  Negative scars, masses, torticollis, lymphadenopathy, JVD Lungs: Clear to auscultation bilaterally without wheezes or crackles Cardiovascular: Regular rate and rhythm without murmur gallop or rub normal S1 and S2 Abdomen: negative abdominal pain, nondistended, positive soft, bowel sounds, no rebound, no ascites, no appreciable mass Extremities: No significant  cyanosis, clubbing, or edema bilateral lower extremities Skin: Negative rashes, lesions, ulcers Psychiatric: Pleasantly confused Central nervous system:  Cranial nerves II through XII intact, tongue/uvula midline, all extremities muscle strength 5/5, sensation intact throughout,  negative dysarthria, negative expressive aphasia, negative receptive aphasia.  .     Data Reviewed: Care during the described time interval was provided by me .  I have reviewed this patient's available data, including medical history, events of note, physical examination, and all test results as part of my evaluation.  CBC: Recent Labs  Lab 09/22/20 1156 09/24/20 0533 09/25/20 0456 09/26/20 0514  WBC 9.3 5.3 4.4 3.6*  NEUTROABS 7.4 4.0 3.0 2.2  HGB 8.8* 9.6* 9.1* 8.7*  HCT 29.6* 33.1* 30.5* 28.9*  MCV 83.4 88.7 83.8 83.8  PLT 339 210 227 143   Basic Metabolic Panel: Recent Labs  Lab 09/22/20 1156 09/24/20 0533 09/25/20 0456 09/26/20 0514  NA 131* 137 139 138  K 4.0 3.8 3.8 4.2  CL 97* 103 102 102  CO2 _0 GLUCOSE 84 103* 87 94  BUN 12 25* 26* 23  CREATININE 1.45* 1.27* 1.15* 1.16*  CALCIUM 8.7* 8.7* 9.1 9.2  MG  --   --  2.0 2.0  PHOS  --   --  3.5 3.5   GFR: Estimated Creatinine Clearance: 26.2 mL/min (A)  (by C-G formula based on SCr of 1.16 mg/dL (H)). Liver Function Tests: Recent Labs  Lab 09/22/20 1156 09/24/20 0533 09/25/20 0456 09/26/20 0514  AST _1 ALT _2 ALKPHOS 98 89 79 81  BILITOT 0.5 0.4 0.4 0.5  PROT 6.2* 5.9* 5.6* 5.7*  ALBUMIN 3.3* 3.0* 2.8* 3.1*   No results for input(s): LIPASE, AMYLASE in the last 168 hours. No results for input(s): AMMONIA in the last 168 hours. Coagulation Profile: No results for input(s): INR, PROTIME in the last 168 hours. Cardiac Enzymes: No results for input(s): CKTOTAL, CKMB, CKMBINDEX, TROPONINI in the last 168 hours. BNP (last 3 results) No results for input(s): PROBNP in the last 8760 hours. HbA1C: No results for input(s): HGBA1C in the last 72 hours. CBG: No results for input(s): GLUCAP in the last 168 hours. Lipid Profile: No results for input(s): CHOL, HDL, LDLCALC, TRIG, CHOLHDL, LDLDIRECT in the last 72 hours. Thyroid Function Tests: No results for input(s): TSH, T4TOTAL, FREET4, T3FREE, THYROIDAB in the last 72 hours. Anemia Panel: Recent Labs    09/25/20 0456 09/26/20 0514  FERRITIN 41 36   Sepsis Labs: No results for input(s): PROCALCITON, LATICACIDVEN in the last 168 hours.  Recent Results (from the past 240 hour(s))  Urine culture     Status: Abnormal   Collection Time: 09/22/20 10:05 AM   Specimen: Urine, Random  Result Value Ref Range Status   Specimen Description   Final    URINE, RANDOM Performed at [facility], [address] [place],  [address]    Special Requests   Final    NONE Performed at [facility], [address] [place], [place] [address]    Culture (A)  Final    <10,000 COLONIES/mL INSIGNIFICANT GROWTH Performed at [facility], [place] [address] [place],  [address]    Report Status 09/23/2020 FINAL  Final  Respiratory Panel by RT PCR (Flu A&B, Covid) - Nasopharyngeal Swab     Status: Abnormal   Collection Time: 09/22/20  5:26  PM   Specimen: Nasopharyngeal Swab  Result Value Ref Range Status   SARS Coronavirus 2 by RT PCR POSITIVE (A) NEGATIVE Final    Comment: RESULT CALLED TO, READ BACK BY AND VERIFIED WITH: SIMMS M RN [id number] PHILLIPS C (NOTE) SARS-CoV-2 target nucleic acids are DETECTED.  SARS-CoV-2 RNA is generally detectable in upper respiratory specimens  during the acute phase of infection. Positive results are indicative of the presence of the identified virus, but do not rule out bacterial infection or co-infection with other pathogens not detected by the test. Clinical correlation with patient history and other diagnostic information is necessary to determine patient infection status. The expected result is Negative.  Fact Sheet for Patients:  PinkCheek.be  Fact Sheet for Healthcare Providers: GravelBags.it  This test is not yet approved or cleared by the [place] FDA and  has been authorized for detection and/or diagnosis of SARS-CoV-2 by FDA under an Emergency Use Authorization (EUA).  This EUA will remain in effect (meaning this test can be u sed) for the duration of  the COVID-19 declaration under Section 564(b)(1) of the Act, 21 U.S.C. section 360bbb-3(b)(1), unless the authorization is terminated or revoked sooner.      Influenza A by PCR NEGATIVE NEGATIVE Final   Influenza B by PCR NEGATIVE NEGATIVE Final    Comment: (NOTE) The Xpert Xpress SARS-CoV-2/FLU/RSV assay is intended as an aid in  the diagnosis of influenza from Nasopharyngeal swab specimens and  should not be used as a sole basis for treatment. Nasal washings and  aspirates are unacceptable for Xpert Xpress SARS-CoV-2/FLU/RSV  testing.  Fact Sheet for Patients: PinkCheek.be  Fact Sheet for Healthcare Providers: GravelBags.it  This test is not yet approved or cleared by the [place] FDA  and  has been authorized for detection and/or diagnosis of SARS-CoV-2 by  FDA under an Emergency Use Authorization (EUA). This EUA will remain  in effect (meaning this test can be used) for the duration of the  Covid-19 declaration under Section 564(b)(1) of the Act, 21  U.S.C. section 360bbb-3(b)(1), unless the authorization is  terminated or revoked. Performed at [facility], [address] [place], [place] [address]          Radiology Studies: No results found.      Scheduled Meds: . buPROPion  300 mg Oral QHS  . Chlorhexidine Gluconate Cloth  6 each Topical Daily  . donepezil  10 mg Oral BID  . enoxaparin (LOVENOX) injection  30 mg Subcutaneous Q24H  . memantine  28 mg Oral Daily  . predniSONE  15 mg Oral Q breakfast   Continuous Infusions: . sodium chloride Stopped (09/23/20 1055)     LOS: 3 days    Time spent:40 min    Ladislaus Repsher, Geraldo Docker, MD [facility] Pager [phone]  If 7PM-7AM, please contact night-coverage [email or web address] Password [email or web address] 09/26/2020, 4:26 PM

## 2020-09-26 NOTE — TOC Initial Note (Addendum)
Transition of Care [facility]) - Initial/Assessment Note    Patient Details  Name: Marcia Lynch MRN: [id number] Date of Birth: 28-Jun-1931  Transition of Care [facility]) CM/SW Contact:    Trish Mage, LCSW Phone Number: 09/26/2020, 10:00 AM  Clinical Narrative:   Patient was seen by palliative with recommendation for return home with hospice support. Patient referred to [facility] as selected by [facility].  Spoke with Ms Marcia Lynch, Health Care POA and care giver who has just been diagnosed with COVID, getting outpatient Remdisivir at [facility].   Ms Marcia Lynch is concerned about  her current somewhat weakened condition impacting her ability to care for patient, especially given that patient experienced several falls previous to admission. We agreed that we would get PT recommendations for patient, as well as getting an accurate reading of her ability to ambulate on her own, or not.  We would than make a decision about sending her to short term rehab v returning home with hospice.  MD messaged for order. TOC will continue to follow during the course of hospitalization.  Addendum:  Patient seen by PT, who recommended 24 hr supervision, SNF.  FL2 sent out for bed search. Will speak with Ms Marcia Lynch about bed offers.                 Expected Discharge Plan:  (Home with hospice v SNF) Barriers to Discharge: Continued Medical Work up   Patient Goals and CMS Choice        Expected Discharge Plan and Services Expected Discharge Plan:  (Home with hospice v SNF)   Discharge Planning Services: CM Consult   Living arrangements for the past 2 months: Single Family Home                                      Prior Living Arrangements/Services Living arrangements for the past 2 months: Single Family Home Lives with:: Friends Patient language and need for interpreter reviewed:: Yes Do you feel safe going back to the place where you live?: Yes      Need for Family Participation in Patient Care:  Yes (Comment) Care giver support system in place?: Yes (comment) Current home services: DME Criminal Activity/Legal Involvement Pertinent to Current Situation/Hospitalization: No - Comment as needed  Activities of Daily Living Home Assistive Devices/Equipment: None ADL Screening (condition at time of admission) Patient's cognitive ability adequate to safely complete daily activities?: Yes Is the patient deaf or have difficulty hearing?: No Does the patient have difficulty seeing, even when wearing glasses/contacts?: No Does the patient have difficulty concentrating, remembering, or making decisions?: Yes Patient able to express need for assistance with ADLs?: No Does the patient have difficulty dressing or bathing?: No Independently performs ADLs?: Yes (appropriate for developmental age) Does the patient have difficulty walking or climbing stairs?: Yes Weakness of Legs: Both Weakness of Arms/Hands: Both  Permission Sought/Granted Permission sought to share information with : Family Supports Permission granted to share information with : Yes, Verbal Permission Granted  Share Information with NAME: Theador Hawthorne" (Friend) [phone]           Emotional Assessment Appearance:: Appears stated age Attitude/Demeanor/Rapport: Engaged Affect (typically observed): Appropriate Orientation: : Oriented to Self, Oriented to Place, Oriented to Situation Alcohol / Substance Use: Not Applicable Psych Involvement: No (comment)  Admission diagnosis:  Confusion [R41.0] Unsteady gait [R26.81] Generalized muscle weakness [M62.81] Frequent falls [R29.6] COVID-19 virus infection [  U07.1] COVID-19 [U07.1] Patient Active Problem List   Diagnosis Date Noted  . COVID-19 virus infection 09/24/2020  . Generalized weakness 09/24/2020  . Severe protein-calorie malnutrition ([place]) 09/24/2020  . Essential hypertension 09/24/2020  . Current chronic use of systemic steroids 09/23/2020  .  Generalized muscle weakness 09/22/2020  . COVID-19 09/22/2020  . Anemia 02/19/2020  . Angiodysplasia of stomach 02/19/2020  . Advanced dementia ([place]) 02/19/2020  . Hypotension 02/11/2020  . Pulmonary embolism ([place]) 02/11/2020  . Pulmonary mass 02/11/2020  . Hypertension 02/11/2020  . GERD (gastroesophageal reflux disease) 02/11/2020  . Fracture, proximal femur ([place]) 12/19/2019  . History of total left hip arthroplasty 12/19/2019  . Radius distal fracture 12/19/2019  . Pulmonary nodules/lesions, multiple 12/19/2019  . Acute respiratory failure with hypoxia ([place]) 12/19/2019  . Rheumatoid arthritis ([place]) 12/19/2019  . Hyperlipidemia 12/19/2019  . Depression 12/19/2019  . HNP (herniated nucleus pulposus), cervical 05/10/2015   PCP:  Loraine Leriche., MD Pharmacy:   [id number] [place], [address] [place] [address] Phone: [phone] Fax: [phone]     Social Determinants of Health (SDOH) Interventions    Readmission Risk Interventions No flowsheet data found.

## 2020-09-26 NOTE — Progress Notes (Signed)
Patient had a good night, medication given, no C/O pain or discomfort, able to make her needs known, slight confusion at times but, is easy to be redirected, in no apparent distress at this time, will continue to monitor during the remainder of the shift.

## 2020-09-27 LAB — COMPREHENSIVE METABOLIC PANEL
ALT: 16 U/L (ref 0–44)
AST: 23 U/L (ref 15–41)
Albumin: 3.3 g/dL — ABNORMAL LOW (ref 3.5–5.0)
Alkaline Phosphatase: 80 U/L (ref 38–126)
Anion gap: 8 (ref 5–15)
BUN: 19 mg/dL (ref 8–23)
CO2: 27 mmol/L (ref 22–32)
Calcium: 9.4 mg/dL (ref 8.9–10.3)
Chloride: 104 mmol/L (ref 98–111)
Creatinine, Ser: 1.13 mg/dL — ABNORMAL HIGH (ref 0.44–1.00)
GFR calc non Af Amer: 44 mL/min — ABNORMAL LOW (ref >60)
Glucose, Bld: 86 mg/dL (ref 70–99)
Potassium: 3.8 mmol/L (ref 3.5–5.1)
Sodium: 139 mmol/L (ref 135–145)
Total Bilirubin: 0.6 mg/dL (ref 0.3–1.2)
Total Protein: 6.6 g/dL (ref 6.5–8.1)

## 2020-09-27 LAB — CBC WITH DIFFERENTIAL/PLATELET
Abs Immature Granulocytes: 0.03 10*3/uL (ref 0.00–0.07)
Basophils Absolute: 0 10*3/uL (ref 0.0–0.1)
Basophils Relative: 0 %
Eosinophils Absolute: 0 10*3/uL (ref 0.0–0.5)
Eosinophils Relative: 0 %
HCT: 30.8 % — ABNORMAL LOW (ref 36.0–46.0)
Hemoglobin: 9.3 g/dL — ABNORMAL LOW (ref 12.0–15.0)
Immature Granulocytes: 1 %
Lymphocytes Relative: 25 %
Lymphs Abs: 1 10*3/uL (ref 0.7–4.0)
MCH: 25.1 pg — ABNORMAL LOW (ref 26.0–34.0)
MCHC: 30.2 g/dL (ref 30.0–36.0)
MCV: 83.2 fL (ref 80.0–100.0)
Monocytes Absolute: 0.5 10*3/uL (ref 0.1–1.0)
Monocytes Relative: 12 %
Neutro Abs: 2.6 10*3/uL (ref 1.7–7.7)
Neutrophils Relative %: 62 %
Platelets: 284 10*3/uL (ref 150–400)
RBC: 3.7 MIL/uL — ABNORMAL LOW (ref 3.87–5.11)
RDW: 18.2 % — ABNORMAL HIGH (ref 11.5–15.5)
WBC: 4.1 10*3/uL (ref 4.0–10.5)
nRBC: 0 % (ref 0.0–0.2)

## 2020-09-27 LAB — C-REACTIVE PROTEIN: CRP: 2.4 mg/dL — ABNORMAL HIGH (ref <1.0)

## 2020-09-27 LAB — FERRITIN: Ferritin: 38 ng/mL (ref 11–307)

## 2020-09-27 LAB — LACTATE DEHYDROGENASE: LDH: 151 U/L (ref 98–192)

## 2020-09-27 LAB — MAGNESIUM: Magnesium: 2.1 mg/dL (ref 1.7–2.4)

## 2020-09-27 LAB — D-DIMER, QUANTITATIVE: D-Dimer, Quant: 1.66 ug/mL-FEU — ABNORMAL HIGH (ref 0.00–0.50)

## 2020-09-27 LAB — PHOSPHORUS: Phosphorus: 3.4 mg/dL (ref 2.5–4.6)

## 2020-09-27 MED ORDER — PREDNISONE 5 MG PO TABS: ORAL_TABLET | ORAL | 0 refills | Status: DC

## 2020-09-27 NOTE — TOC Transition Note (Signed)
Transition of Care [facility]) - CM/SW Discharge Note   Patient Details  Name: Marcia Lynch MRN: [id number] Date of Birth: 07/02/1931  Transition of Care [facility]) CM/SW Contact:  Trish Mage, LCSW Phone Number: 09/27/2020, 12:01 PM   Clinical Narrative:   Patient is approved for 3 days of rehab at [facility] commencing today through Friday the 8th.  Reviewer is Ariste D and FAX number is [phone].  Caregiver informed.  PTAR arranged.  Nursing, please call report to [phone], room 307. TOC sign off.    Final next level of care: Skilled Nursing Facility Barriers to Discharge: Barriers Resolved   Patient Goals and CMS Choice        Discharge Placement                       Discharge Plan and Services   Discharge Planning Services: CM Consult                                 Social Determinants of Health (SDOH) Interventions     Readmission Risk Interventions No flowsheet data found.

## 2020-09-27 NOTE — Progress Notes (Signed)
Report called and provided to [place] RN at   [facility] .   Service: Skilled Nursing Contact information: [address] [place] [phone]

## 2020-09-27 NOTE — Discharge Summary (Addendum)
[facility] Discharge Summary   Patient: Marcia Lynch HAL:[id number]  PCP: Loraine Leriche., MD  Date of admission: 09/22/2020   Date of discharge:  09/27/2020     Discharge Diagnoses:  Principal Problem:   COVID-19 Active Problems:   Rheumatoid arthritis ([place])   Pulmonary mass   Hypertension   Anemia   Advanced dementia ([place])   Generalized muscle weakness   Current chronic use of systemic steroids   COVID-19 virus infection   Generalized weakness   Severe protein-calorie malnutrition ([place])   Essential hypertension   Admitted From: home Disposition:  SNF   Recommendations for Outpatient Follow-up:  1. PCP: please follow up with PCP in 1 week 2. Follow up LABS/TEST:  none   Contact information for follow-up providers    Velazquez, Fransico Meadow., MD. Schedule an appointment as soon as possible for a visit in 1 week(s).   Specialty: Internal Medicine Contact information: [place] [address] [phone]        palliative care Follow up.   Why: continue to engage with goals of care with family.           Contact information for after-discharge care    Destination    [facility]erred SNF .   Service: Skilled Nursing Contact information: [address] [address] [phone]                 Diet recommendation: Cardiac diet  Activity: The patient is advised to gradually reintroduce usual activities, as tolerated  Discharge Condition: stable  Code Status: DNR   History of present illness: As per the H and P dictated on admission, "Marcia Lynch is a 86 y.o. female with medical history significant of Alzheimer's dementia, RA on chronic steroids, lung mass probably cancer not undergoing further work up or treatment due to age.  Pt has had COVID vaccine(s) or at least 1 of the 2 Moderna's  Pt presented to ED at [facility] on 10/1 with multiple falls and increased confusion.   Unable to ambulate safely in department.  No injuries from fall.  She normally walks with a walker but has had to have more assistance during the night.  Denies recent fevers, N/V, diarrhea, dysuria.  Pt's friend who is also POA and brought her to ED notes: Pt has h/o lung mass, no further treatment or diagnosis as family wouldn't want to pursue due to age.  Concern about abd distention though.  No abd pain"  Hospital Course:  Summary of her active problems in the hospital is as following.  COVID-19 infection -Most likely incidental infection.  Patient has no respiratory symptoms, no abdominal symptoms. - completed Remdesivir  Generalized weakness -Multifactorial advanced Alzheimer's dementia, undiagnosed neoplasm, protein calorie malnutrition, possible adrenal insufficiency secondary to chronic steroid use and covid 19 infection.  Chronic steroid use -May be the cause of mild adrenal insufficiency in the setting of acute illness. -Prednisone taper, continue 5 mg daily at the end of the taper.  Rheumatoid arthritis  -Will be covered by chronic steroids also on leflunomide and hydroxychloroquine   Pulmonary mass right upper lobe -Likely lung cancer family does not want further work-up or treatment -Consulted palliative care.  See goals of care  Anemia unspecified -Chronic/stable  Essential HTN -Given patient's age and frailty would let BP run slightly high hold all BP medication  Severe protein calorie malnutrition Body mass index is 18.02 kg/m.  -Marinol 2.5 mg BID -Encourage patient  to consume meals -Ensure TID between meals  Goals of care -10/3 Palliative Care; patient with large mass in chest which family does not want addressed.  Patient admitted for weakness however most likely not due to incidental diagnosis of Covid.  In the setting of a malignancy which family does not want addressed -Per palliative care family has decided on DNR, and are trying to decide on  home with hospice, but for now request continue SNF plans  Patient was seen by physical therapy, who recommended SNF,  was arranged. On the day of the discharge the patient's vitals were stable, and no other acute medical condition were reported by patient. The patient was felt safe to be discharge at SNF with therapy.  Consultants: none Procedures: none  Discharge Exam: General: Appear in no distress, no Rash; Oral Mucosa Clear, moist. no Abnormal Neck Mass Or lumps, Conjunctiva normal  Cardiovascular: S1 and S2 Present, no Murmur Respiratory: good respiratory effort, Bilateral Air entry present and bilateral mild Crackles, left sided expiratory  wheezes Abdomen: Bowel Sound present, Soft and no tenderness Extremities: no Pedal edema Neurology: alert and oriented to time and place affect appropriate. no new focal deficit  Filed Weights   09/22/20 1017  Weight: 47.6 kg   Vitals:   09/27/20 0604 09/27/20 1322  BP:  135/79  Pulse: 64 78  Resp:  18  Temp:  98.5 F (36.9 C)  SpO2: 92% 98%    DISCHARGE MEDICATION: Allergies as of 09/27/2020      Reactions   Methotrexate Other (See Comments)   Severe pancytopenia  Severe pancytopenia    Cymbalta [duloxetine Hcl] Rash   Duloxetine Rash   Rash with Itching 08/20/10      Medication List    STOP taking these medications   HYDROcodone-acetaminophen 5-325 MG tablet Commonly known as: NORCO/VICODIN   methocarbamol 500 MG tablet Commonly known as: ROBAXIN   potassium chloride SA 20 MEQ tablet Commonly known as: KLOR-CON     TAKE these medications   acetaminophen 325 MG tablet Commonly known as: TYLENOL Take 650 mg by mouth every 6 (six) hours as needed (For Pain).   albuterol 108 (90 Base) MCG/ACT inhaler Commonly known as: VENTOLIN HFA Inhale 2 puffs into the lungs every 6 (six) hours as needed for wheezing or shortness of breath.   amLODipine 5 MG tablet Commonly known as: NORVASC Take 0.5 tablets (2.5 mg total) by  mouth daily.   apixaban 5 MG Tabs tablet Commonly known as: Eliquis Take 1 tablet (5 mg total) by mouth 2 (two) times daily.   ascorbic acid 500 MG tablet Commonly known as: VITAMIN C Take 1 tablet (500 mg total) by mouth daily.   atorvastatin 10 MG tablet Commonly known as: LIPITOR Take 1 tablet (10 mg total) by mouth at bedtime. FOR HYPERCHOLESTEROLEMIA   buPROPion 300 MG 24 hr tablet Commonly known as: WELLBUTRIN XL Take 1 tablet (300 mg total) by mouth at bedtime.   calcium carbonate 600 MG Tabs tablet Commonly known as: OS-CAL Take 600 mg by mouth daily with breakfast.   donepezil 10 MG tablet Commonly known as: ARICEPT Take 1 tablet (10 mg total) by mouth 2 (two) times daily.   Ensure Take 237 mLs by mouth 2 (two) times daily. Lactose reduced   ferrous sulfate 325 (65 FE) MG tablet Take 1 tablet (325 mg total) by mouth 2 (two) times daily with a meal. For Anemia   hydroxychloroquine 200 MG tablet Commonly known as: PLAQUENIL Take  200 mg by mouth daily.   leflunomide 20 MG tablet Commonly known as: ARAVA Take 1 tablet (20 mg total) by mouth daily. Take on M-W-F-Su   memantine 28 MG Cp24 24 hr capsule Commonly known as: NAMENDA XR Take 1 capsule (28 mg total) by mouth daily.   mirtazapine 30 MG tablet Commonly known as: REMERON Take 1 tablet (30 mg total) by mouth every evening.   multivitamin with minerals Tabs tablet Take 1 tablet by mouth daily.   omeprazole 20 MG capsule Commonly known as: PRILOSEC Take 1 capsule (20 mg total) by mouth daily.   predniSONE 5 MG tablet Commonly known as: DELTASONE Take 15mg  daily for 3days,Take 10mg  daily for 3days,Take 5mg  daily. What changed:   how much to take  how to take this  when to take this  additional instructions   sertraline 100 MG tablet Commonly known as: ZOLOFT Take 1.5 tablets (150 mg total) by mouth daily. What changed: how much to take   sulfamethoxazole-trimethoprim 800-160 MG  tablet Commonly known as: Bactrim DS TAKE 1 TABLET BY MOUTH EVERY MONDAY - WEDNESDAY - FRIDAY FOR PCP PROPHYLAXIS DUE TO CHRONIC STEROID USE   vitamin B-12 1000 MCG tablet Commonly known as: CYANOCOBALAMIN Take 1 tablet (1,000 mcg total) by mouth daily.      Allergies  Allergen Reactions  . Methotrexate Other (See Comments)    Severe pancytopenia  Severe pancytopenia    . Cymbalta [Duloxetine Hcl] Rash  . Duloxetine Rash    Rash with Itching 08/20/10     The results of significant diagnostics from this hospitalization (including imaging, microbiology, ancillary and laboratory) are listed below for reference.    Significant Diagnostic Studies: CT ABDOMEN PELVIS WO CONTRAST  Result Date: 09/22/2020 CLINICAL DATA:  Acute abdominal pain. EXAM: CT ABDOMEN AND PELVIS WITHOUT CONTRAST TECHNIQUE: Multidetector CT imaging of the abdomen and pelvis was performed following the standard protocol without IV contrast. COMPARISON:  March 09, 2020. FINDINGS: Lower chest: No acute abnormality. Hepatobiliary: Cholelithiasis is noted without biliary dilatation. The liver is unremarkable. Pancreas: Grossly stable 1.8 cm hypodense area seen in pancreatic head. No pancreatic ductal dilatation or surrounding inflammatory changes. Spleen: Stable splenic cyst. Adrenals/Urinary Tract: Adrenal glands appear normal. Stable left renal cysts are noted. No hydronephrosis or renal obstruction is noted. No renal or ureteral calculi are noted. Urinary bladder somewhat obscured due to scatter artifact arising from left hip prosthesis. Visualized portion is unremarkable. Stomach/Bowel: Stomach is within normal limits. Appendix appears normal. No evidence of bowel wall thickening, distention, or inflammatory changes. Vascular/Lymphatic: Aortic atherosclerosis. No enlarged abdominal or pelvic lymph nodes. Reproductive: No definite mass or other abnormality is noted. Other: No abdominal wall hernia or abnormality. No  abdominopelvic ascites. Musculoskeletal: As noted above, status post left total hip arthroplasty. Status post surgical repair of old proximal right femoral fracture. No acute osseous abnormality is noted. IMPRESSION: 1. Cholelithiasis without biliary dilatation. 2. Stable 1.8 cm hypodense area seen in pancreatic head. Recommend follow up pre and post contrast MRI/MRCP or pancreatic protocol CT in 2 years. This recommendation follows ACR consensus guidelines: Management of Incidental Pancreatic Cysts: A White Paper of the ACR Incidental Findings Committee. J Am Coll Radiol 7654;65:[id number]. 3. Stable left renal cysts. 4. No hydronephrosis or renal obstruction is noted. 5. No renal or ureteral calculi are noted. 6. Aortic atherosclerosis. Aortic Atherosclerosis (ICD10-I70.0). Electronically Signed   By: Marijo Conception M.D.   On: 09/22/2020 14:08   DG Chest 2 View  Result  Date: 09/22/2020 CLINICAL DATA:  Cough and weakness. EXAM: CHEST - 2 VIEW COMPARISON:  07/07/2020 FINDINGS: Right upper lobe mass lesion unchanged approximately 3.5 x 4.5 cm. Smaller 1 cm nodule left lateral midlung unchanged from 05/20/2020. Negative for heart failure or pneumonia.  No effusion. Bilateral shoulder replacement. IMPRESSION: Mass lesion right upper lobe unchanged consistent with carcinoma. Second nodule in the left upper lobe may represent metastatic disease or a second neoplasm. Electronically Signed   By: Franchot Gallo M.D.   On: 09/22/2020 14:07   CT Head Wo Contrast  Result Date: 09/22/2020 CLINICAL DATA:  [place] EXAM: CT HEAD WITHOUT CONTRAST TECHNIQUE: Contiguous axial images were obtained from the base of the skull through the vertex without intravenous contrast. COMPARISON:  07/07/2020 FINDINGS: Brain: No evidence of acute infarction, hemorrhage, hydrocephalus, extra-axial collection or mass lesion/mass effect. Moderate low-density changes within the periventricular and subcortical white matter compatible with chronic  microvascular ischemic change. Moderate diffuse cerebral volume loss, most pronounced within the temporal lobes. Vascular: Atherosclerotic calcifications involving the large vessels of the skull base. No unexpected hyperdense vessel. Skull: Normal. Negative for fracture or focal lesion. Sinuses/Orbits: Scattered bilateral mastoid air cell opacification. Paranasal sinuses are clear. Unremarkable orbital structures. Other: None. IMPRESSION: 1. No acute intracranial findings. 2. Chronic microvascular ischemic change and cerebral volume loss. 3. Scattered bilateral mastoid air cell opacification. Electronically Signed   By: Davina Poke D.O.   On: 09/22/2020 11:28    Microbiology: Recent Results (from the past 240 hour(s))  Urine culture     Status: Abnormal   Collection Time: 09/22/20 10:05 AM   Specimen: Urine, Random  Result Value Ref Range Status   Specimen Description   Final    URINE, RANDOM Performed at [facility], [address] [place], [place] [address]    Special Requests   Final    NONE Performed at [facility], [address] [place], [place] [address]    Culture (A)  Final    <10,000 COLONIES/mL INSIGNIFICANT GROWTH Performed at [facility], [place] [address], [place], [place] [address]    Report Status 09/23/2020 FINAL  Final  Respiratory Panel by RT PCR (Flu A&B, Covid) - Nasopharyngeal Swab     Status: Abnormal   Collection Time: 09/22/20  5:26 PM   Specimen: Nasopharyngeal Swab  Result Value Ref Range Status   SARS Coronavirus 2 by RT PCR POSITIVE (A) NEGATIVE Final    Comment: RESULT CALLED TO, READ BACK BY AND VERIFIED WITH: SIMMS M RN [id number] PHILLIPS C (NOTE) SARS-CoV-2 target nucleic acids are DETECTED.  SARS-CoV-2 RNA is generally detectable in upper respiratory specimens  during the acute phase of infection. Positive results are indicative of the presence of the identified virus, but do not rule out bacterial infection or  co-infection with other pathogens not detected by the test. Clinical correlation with patient history and other diagnostic information is necessary to determine patient infection status. The expected result is Negative.  Fact Sheet for Patients:  PinkCheek.be  Fact Sheet for Healthcare Providers: GravelBags.it  This test is not yet approved or cleared by the [place] FDA and  has been authorized for detection and/or diagnosis of SARS-CoV-2 by FDA under an Emergency Use Authorization (EUA).  This EUA will remain in effect (meaning this test can be u sed) for the duration of  the COVID-19 declaration under Section 564(b)(1) of the Act, 21 U.S.C. section 360bbb-3(b)(1), unless the authorization is terminated  or revoked sooner.      Influenza A by PCR NEGATIVE NEGATIVE Final   Influenza B by PCR NEGATIVE NEGATIVE Final    Comment: (NOTE) The Xpert Xpress SARS-CoV-2/FLU/RSV assay is intended as an aid in  the diagnosis of influenza from Nasopharyngeal swab specimens and  should not be used as a sole basis for treatment. Nasal washings and  aspirates are unacceptable for Xpert Xpress SARS-CoV-2/FLU/RSV  testing.  Fact Sheet for Patients: PinkCheek.be  Fact Sheet for Healthcare Providers: GravelBags.it  This test is not yet approved or cleared by the [place] FDA and  has been authorized for detection and/or diagnosis of SARS-CoV-2 by  FDA under an Emergency Use Authorization (EUA). This EUA will remain  in effect (meaning this test can be used) for the duration of the  Covid-19 declaration under Section 564(b)(1) of the Act, 21  U.S.C. section 360bbb-3(b)(1), unless the authorization is  terminated or revoked. Performed at [facility], [address] [place], [place] [address]      Labs: CBC: Recent Labs  Lab 09/22/20 1156  09/24/20 0533 09/25/20 0456 09/26/20 0514 09/27/20 0442  WBC 9.3 5.3 4.4 3.6* 4.1  NEUTROABS 7.4 4.0 3.0 2.2 2.6  HGB 8.8* 9.6* 9.1* 8.7* 9.3*  HCT 29.6* 33.1* 30.5* 28.9* 30.8*  MCV 83.4 88.7 83.8 83.8 83.2  PLT 339 210 227 265 248   Basic Metabolic Panel: Recent Labs  Lab 09/22/20 1156 09/24/20 0533 09/25/20 0456 09/26/20 0514 09/27/20 0442  NA 131* 137 139 138 139  K 4.0 3.8 3.8 4.2 3.8  CL 97* 103 102 102 104  CO2 25 23 27 27 27   GLUCOSE 84 103* 87 94 86  BUN 12 25* 26* 23 19  CREATININE 1.45* 1.27* 1.15* 1.16* 1.13*  CALCIUM 8.7* 8.7* 9.1 9.2 9.4  MG  --   --  2.0 2.0 2.1  PHOS  --   --  3.5 3.5 3.4   Liver Function Tests: Recent Labs  Lab 09/22/20 1156 09/24/20 0533 09/25/20 0456 09/26/20 0514 09/27/20 0442  AST 22 23 22 21 23   ALT 13 14 13 14 16   ALKPHOS 98 89 79 81 80  BILITOT 0.5 0.4 0.4 0.5 0.6  PROT 6.2* 5.9* 5.6* 5.7* 6.6  ALBUMIN 3.3* 3.0* 2.8* 3.1* 3.3*   CBG: No results for input(s): GLUCAP in the last 168 hours.  Time spent: 35 minutes  Signed:  Berle Mull  [facility]  09/27/2020 3:16 PM

## 2020-09-27 NOTE — Progress Notes (Addendum)
Made Debbie, (emergency contact) aware that patient was discharged and confirmed that patients walker with her. All belongings sent with patient.

## 2020-09-28 ENCOUNTER — Encounter: Payer: Self-pay | Admitting: Internal Medicine

## 2020-09-28 ENCOUNTER — Non-Acute Institutional Stay (SKILLED_NURSING_FACILITY): Payer: Medicare HMO | Admitting: Internal Medicine

## 2020-09-28 DIAGNOSIS — U071 COVID-19: Secondary | ICD-10-CM

## 2020-09-28 DIAGNOSIS — R918 Other nonspecific abnormal finding of lung field: Secondary | ICD-10-CM | POA: Diagnosis not present

## 2020-09-28 DIAGNOSIS — F0390 Unspecified dementia without behavioral disturbance: Secondary | ICD-10-CM

## 2020-09-28 DIAGNOSIS — R531 Weakness: Secondary | ICD-10-CM

## 2020-09-28 DIAGNOSIS — I10 Essential (primary) hypertension: Secondary | ICD-10-CM

## 2020-09-28 DIAGNOSIS — F03C0 Unspecified dementia, severe, without behavioral disturbance, psychotic disturbance, mood disturbance, and anxiety: Secondary | ICD-10-CM

## 2020-09-28 NOTE — Assessment & Plan Note (Signed)
Hospice involvement post discharge home from the SNF is anticipated.

## 2020-09-28 NOTE — Assessment & Plan Note (Signed)
Monoclonal infusion of antibiotics will be pursued should she have clinical deterioration.

## 2020-09-28 NOTE — Progress Notes (Signed)
NURSING HOME LOCATION:  [facility] ROOM NUMBER:  307-A  CODE STATUS:  DNR  PCP:  Loraine Leriche., MD  [place] [address]  This is a comprehensive admission note to [facility] performed on this date less than 30 days from date of admission. Included are preadmission medical/surgical history; reconciled medication list; family history; social history and comprehensive review of systems.  Corrections and additions to the records were documented. Comprehensive physical exam was also performed. Additionally a clinical summary was entered for each active diagnosis pertinent to this admission in the Problem List to enhance continuity of care.  HPI: Patient was hospitalized 10/1-10/05/2020 with active Covid infection, admitted from home.  This is in the context of a history of lung mass presumed to be cancer without ongoing evaluation or intervention due to age and dementia. Patient presented to the ED with multiple falls and increased confusion superimposed on her baseline dementia. PCR testing was positive; the patient had no new respiratory symptoms despite the history of lung mass  And COPD.  She received a course of remdesivir. The falls were attributed generalized weakness with a multifactorial etiology including the probable neoplastic process, protein caloric malnutrition,and possible adrenal insufficiency secondary to chronic steroid use. The patient is on maintenance chronic steroids as well as leflunomide and hydroxychloroquine for the rheumatoid arthritis. Anemia was felt to be chronic and stable. Permissive hypertension was allowed because of the advanced age and frailty. Marinol 2.5 mg twice daily was initiated for her protein caloric malnutrition. Palliative care was consulted because of the lung mass.  Family decided on DNR and are considering home placement with Hospice.  In the interim SNF placement was pursued as recommended by PT.  Past  medical and surgical history: Includes rheumatoid arthritis history of PTE, essential hypertension, anxiety, and dementia. Surgeries and procedures include carpal tunnel release, cervical fusion and foraminotomy, and rotator cuff repair.  Social history: Nondrinker; fourth of a pack a day smoker with a 17-pack-year history according to the chart.  Today she gave me 2 different answers stating that she quit smoking several months ago and then stating she quit smoking 2-3 weeks ago and was smoking a pack a day up until that time.  Family history: Noncontributory due to advanced age.   Review of systems:  Could not be completed due to dementia. Date given as 2014.  She could not name the president.  She does validate some shortness of breath and occasional headache.  She stated she had difficulty answering my questions as "I have so much on my mind". PT/OT states that her balance is surprisingly good and she is able to participate in exercises.  Constitutional: No fever, significant weight change, fatigue  Eyes: No redness, discharge, pain, vision change ENT/mouth: No nasal congestion, purulent discharge, earache, change in hearing, sore throat  Cardiovascular: No chest pain, palpitations, paroxysmal nocturnal dyspnea, claudication, edema  Respiratory: No  sputum production, hemoptysis, significant snoring, apnea  Gastrointestinal: No heartburn, dysphagia, abdominal pain, nausea /vomiting, rectal bleeding, melena, change in bowels Genitourinary: No dysuria, hematuria, pyuria, incontinence, nocturia Musculoskeletal: No joint stiffness, joint swelling, weakness, pain Dermatologic: No rash, pruritus, change in appearance of skin Neurologic: No dizziness, headache, syncope, seizures, numbness, tingling Psychiatric: No significant anxiety, depression, insomnia, anorexia Endocrine: No change in hair/skin/nails, excessive thirst, excessive hunger, excessive urination  Hematologic/lymphatic: No  significant bruising, lymphadenopathy, abnormal bleeding Allergy/immunology: No itchy/watery eyes, significant sneezing, urticaria, angioedema  Physical exam:  Pertinent or positive findings:  She appears somewhat chronically ill with weathered facies.  Eyebrows are thin.  She has complete dentures.  She has homogenous diffuse, low-grade expiratory wheezing.  She coughs intermittently nonproductively.  There is a grade 1 systolic murmur heard best in the epigastrium.  Dorsalis pedis pulses are stronger than the posterior tibial pulses.  She has interosseous wasting of the hands.  The arthritic changes in the hands suggest more osteoarthritis than rheumatoid arthritis.  There are mainly DIP deformities with some distal deviation of the digits.  She is surprisingly strong to opposition in all extremities.  She has diffuse scarring and irregular hypopigmentation over the forearms.  General appearance:  no acute distress, increased work of breathing is present.   Lymphatic: No lymphadenopathy about the head, neck, axilla. Eyes: No conjunctival inflammation or lid edema is present. There is no scleral icterus. Ears:  External ear exam shows no significant lesions or deformities.   Nose:  External nasal examination shows no deformity or inflammation. Nasal mucosa are pink and moist without lesions, exudates Neck:  No thyromegaly, masses, tenderness noted.    Heart:  Normal rate and regular rhythm. S1 and S2 normal without gallop,  click, rub.  Lungs:  without  rhonchi, rales, rubs. Abdomen: Bowel sounds are normal.  Abdomen is soft and nontender with no organomegaly, hernias, masses. GU: Deferred  Extremities:  No cyanosis, clubbing, edema. Skin: Warm & dry w/o tenting. No significant  rash.  See clinical summary under each active problem in the Problem List with associated updated therapeutic plan

## 2020-09-28 NOTE — Assessment & Plan Note (Addendum)
BP controlled; no change in antihypertensive medication. Permissive hypertension will be allowed because of advanced age and multiple advanced comorbidities.

## 2020-09-28 NOTE — Patient Instructions (Signed)
See assessment and plan under each diagnosis in the problem list and acutely for this visit 

## 2020-09-28 NOTE — Assessment & Plan Note (Signed)
Namenda and Aricept are unlikely to help dementia associated with vascular etiologies; however, stopping these medicines could result in some decompensation especially in view of the recent AMS changes which prompted admission.

## 2020-09-28 NOTE — Assessment & Plan Note (Signed)
Multifactorial adult failure to thrive syndrome. PT/OT at SNF.

## 2020-10-06 ENCOUNTER — Non-Acute Institutional Stay (SKILLED_NURSING_FACILITY): Payer: Medicare HMO | Admitting: Adult Health

## 2020-10-06 ENCOUNTER — Encounter: Payer: Self-pay | Admitting: Adult Health

## 2020-10-06 DIAGNOSIS — R918 Other nonspecific abnormal finding of lung field: Secondary | ICD-10-CM

## 2020-10-06 DIAGNOSIS — U071 COVID-19: Secondary | ICD-10-CM | POA: Diagnosis not present

## 2020-10-06 DIAGNOSIS — R059 Cough, unspecified: Secondary | ICD-10-CM | POA: Diagnosis not present

## 2020-10-06 DIAGNOSIS — I10 Essential (primary) hypertension: Secondary | ICD-10-CM | POA: Diagnosis not present

## 2020-10-06 DIAGNOSIS — D6489 Other specified anemias: Secondary | ICD-10-CM

## 2020-10-06 DIAGNOSIS — R531 Weakness: Secondary | ICD-10-CM

## 2020-10-06 NOTE — Progress Notes (Addendum)
Location:  [facility] Room Number: 307-A Place of Service:  SNF (31) Provider:  Durenda Age, DNP, FNP-BC  Patient Care Team: Loraine Leriche., MD as PCP - General (Internal Medicine) [place], [facility] Registered Nurse [facility])  Extended Emergency Contact Information Primary Emergency Contact: Theador Hawthorne" Address: [address]          [place], [place] [address] Johnnette Litter of [place] Phone: [phone] Mobile Phone: [phone] Relation: Friend Secondary Emergency Contact: Jules Husbands [place] of [place] Phone: [phone] Mobile Phone: [phone] Relation: Other  Code Status:  DNR  Goals of care: Advanced Directive information Advanced Directives 09/28/2020  Does Patient Have a Medical Advance Directive? Yes  Type of Paramedic of [place];Out of facility DNR (pink MOST or yellow form)  Does patient want to make changes to medical advance directive? No - Patient declined  Copy of [place] in Chart? Yes - validated most recent copy scanned in chart (See row information)  Pre-existing out of facility DNR order (yellow form or pink MOST form) Yellow form placed in chart (order not valid for inpatient use)     Chief Complaint  Patient presents with  . Medical Management of Chronic Issues    Short-term rehabilitation visit    HPI:  Pt is a 86 y.o. female seen today for medical management of chronic diseases. She has a PMH of dementia, rheumatoid arthritis on chronic steroids, lung mass probably cancer not undergoing further work-up or treatment due to age. She was seen using the bathroom today and did not ask for assistance. She was noted to have occasional coughing with noted rhonchi on bilateral lung fields.  She stated that she has whitish phlegm.  No reported fever.  Currently, on O2 at 2 L/minute via [place] continuously.  SBP's ranging from  102 to 125.  She is currently taking amlodipine 2.5 mg daily.  She was admitted to [facility] on 09/27/20 post [facility] hospitalization 09/22/20 to 09/27/20.  She has had multiple falls and increased confusion.  She was unable to ambulate safely.  Patient's friend/POA  brought her to ED. She has had Moderna COVID-19 vaccine 1/2.  She tested positive for COVID-19 infection with no respiratory symptoms. She completed remdesivir treatment.  Generalized weakness was thought to be multifactorial from advanced Alzheimer's dementia, undiagnosed neoplasm, protein calorie malnutrition, possible adrenal insufficiency secondary to chronic steroid use and COVID-19 infection.  She has history of pulmonary mass on the right upper lobe which is thought to be likely cancerous but family does not want further work-up/treatment.  Palliative care was consulted.  Per palliative care, family is trying to decide on home with hospice when she goes home from SNF.   Past Medical History:  Diagnosis Date  . Arthritis   . Cough    loose  started 05/05/15 no fever  . Dementia ([place])    STARTING SIGNS OF DEMENTIA  . Depression    TAKING ZOLOFT FOR A LONG TIME  . Headache(784.0)   . Hypertension   . Lung mass   . PE (pulmonary thromboembolism) ([place])   . Rheumatoid arthritis [facility])    Past Surgical History:  Procedure Laterality Date  . CARPAL TUNNEL RELEASE     RIGHT  . EYE SURGERY     BILATERAL CATARACTS  . FRACTURE SURGERY     RIGHT HIP-HAS SCREW  . POSTERIOR CERVICAL FUSION/FORAMINOTOMY N/A 09/20/2013   Procedure: CERVICAL THREE  TO CERVICAL SEVEN  POSTERIOR CERVICAL FUSION;  Surgeon: Elaina Hoops, MD;  Location: [facility] NEURO ORS;  Service: Neurosurgery;  Laterality: N/A;  CERVICAL THREE TO CERVICAL SEVEN  POSTERIOR CERVICAL FUSION  . POSTERIOR CERVICAL FUSION/FORAMINOTOMY N/A 05/10/2015   Procedure: Posterior Cervical Laminectomy Diskectomy and Fusion Cervical seven Thoracic one. Exploration of Fusion  Removal of Hardware Cervical three to seven;  Surgeon: Kary Kos, MD;  Location: [facility]O ORS;  Service: Neurosurgery;  Laterality: N/A;  . ROTATOR CUFF REPAIR     RIGHT SHOULDER  . SHOULDER ARTHROSCOPY     ? LEFT BONE SPURSS    Allergies  Allergen Reactions  . Methotrexate Other (See Comments)    Severe pancytopenia  Severe pancytopenia    . Cymbalta [Duloxetine Hcl] Rash  . Duloxetine Rash    Rash with Itching 08/20/10    Outpatient Encounter Medications as of 10/06/2020  Medication Sig  . acetaminophen (TYLENOL) 325 MG tablet Take 650 mg by mouth every 6 (six) hours as needed (For Pain).  Marland Kitchen albuterol (VENTOLIN HFA) 108 (90 Base) MCG/ACT inhaler Inhale 2 puffs into the lungs every 6 (six) hours as needed for wheezing or shortness of breath.  Marland Kitchen amLODipine (NORVASC) 5 MG tablet Take 0.5 tablets (2.5 mg total) by mouth daily.  Marland Kitchen apixaban (ELIQUIS) 5 MG TABS tablet Take 1 tablet (5 mg total) by mouth 2 (two) times daily.  Marland Kitchen ascorbic acid (VITAMIN C) 500 MG tablet Take 1 tablet (500 mg total) by mouth daily.  Marland Kitchen atorvastatin (LIPITOR) 10 MG tablet Take 1 tablet (10 mg total) by mouth at bedtime. FOR HYPERCHOLESTEROLEMIA  . buPROPion (WELLBUTRIN XL) 300 MG 24 hr tablet Take 1 tablet (300 mg total) by mouth at bedtime.  . calcium carbonate (OS-CAL) 600 MG TABS tablet Take 600 mg by mouth daily with breakfast.   . donepezil (ARICEPT) 10 MG tablet Take 1 tablet (10 mg total) by mouth 2 (two) times daily.  . Ensure (ENSURE) Take 237 mLs by mouth 2 (two) times daily. Lactose reduced  . ferrous sulfate 325 (65 FE) MG tablet Take 1 tablet (325 mg total) by mouth 2 (two) times daily with a meal. For Anemia  . hydroxychloroquine (PLAQUENIL) 200 MG tablet Take 200 mg by mouth daily.  Marland Kitchen leflunomide (ARAVA) 20 MG tablet Take 1 tablet (20 mg total) by mouth daily. Take on M-W-F-Su  . memantine (NAMENDA XR) 28 MG CP24 24 hr capsule Take 1 capsule (28 mg total) by mouth daily.  . mirtazapine (REMERON)  30 MG tablet Take 1 tablet (30 mg total) by mouth every evening.  . Multiple Vitamin (MULTIVITAMIN WITH MINERALS) TABS tablet Take 1 tablet by mouth daily.  Marland Kitchen omeprazole (PRILOSEC) 20 MG capsule Take 1 capsule (20 mg total) by mouth daily.  . predniSONE (DELTASONE) 5 MG tablet Take 15mg  daily for 3days,Take 10mg  daily for 3days,Take 5mg  daily.  . sertraline (ZOLOFT) 100 MG tablet Take 1.5 tablets (150 mg total) by mouth daily.  Marland Kitchen sulfamethoxazole-trimethoprim (BACTRIM DS) 800-160 MG tablet TAKE 1 TABLET BY MOUTH EVERY MONDAY - WEDNESDAY - FRIDAY FOR PCP PROPHYLAXIS DUE TO CHRONIC STEROID USE  . vitamin B-12 (CYANOCOBALAMIN) 1000 MCG tablet Take 1 tablet (1,000 mcg total) by mouth daily.   No facility-administered encounter medications on file as of 10/06/2020.    Review of Systems  GENERAL: No fever, chills or weakness MOUTH and THROAT: Denies oral discomfort, gingival pain or bleeding RESPIRATORY: + cough CARDIAC: No chest pain, edema or palpitations GI: No  abdominal pain, diarrhea, constipation, heart burn, nausea or vomiting GU: Denies dysuria, frequency, hematuria, incontinence, or discharge NEUROLOGICAL: Denies dizziness, syncope, numbness, or headache PSYCHIATRIC: Denies feelings of depression or anxiety. No report of hallucinations, insomnia, paranoia, or agitation   Immunization History  Administered Date(s) Administered  . Influenza, High Dose Seasonal PF 09/26/2015, 09/29/2018, 09/26/2019, 09/21/2020  . Influenza,inj,Quad PF,6+ Mos 09/21/2013  . Moderna SARS-COVID-2 Vaccination 12/23/2019  . Pneumococcal Conjugate-13 12/09/2016  . Pneumococcal Polysaccharide-23 12/23/1996, 02/28/2005, 09/22/2013  . Td 12/23/1997  . Tdap 09/30/2009, 07/09/2019, 03/02/2020, 06/26/2020  . Zoster 01/02/2012   Pertinent  Health Maintenance Due  Topic Date Due  . DEXA SCAN  Never done  . INFLUENZA VACCINE  Completed  . PNA vac Low Risk Adult  Completed   No flowsheet data  found.   Vitals:   10/06/20 1019  BP: 102/64  Pulse: 76  Resp: 18  Temp: 97.8 F (36.6 C)  TempSrc: Oral  Weight: 98 lb 9.6 oz (44.7 kg)  Height: 5\' 4"  (1.626 m)   Body mass index is 16.92 kg/m.  Physical Exam  GENERAL APPEARANCE:  In no acute distress.  SKIN:  Skin is warm and dry.  MOUTH and THROAT: Lips are without lesions. Oral mucosa is moist and without lesions. Tongue is normal in shape, size, and color and without lesions RESPIRATORY: + Rhonchi, O2 at 2 L/minute via  continuously CARDIAC: RRR, no murmur,no extra heart sounds, no edema GI: Abdomen soft, normal BS, no masses, no tenderness EXTREMITIES:  Able to move X 4 extremities NEUROLOGICAL: There is no tremor. Speech is clear. Alert to self, disoriented to time and place. PSYCHIATRIC:  Affect and behavior are appropriate  Labs reviewed: Recent Labs    09/25/20 0456 09/26/20 0514 09/27/20 0442  NA 139 138 139  K 3.8 4.2 3.8  CL 102 102 104  CO2 27 27 27   GLUCOSE 87 94 86  BUN 26* 23 19  CREATININE 1.15* 1.16* 1.13*  CALCIUM 9.1 9.2 9.4  MG 2.0 2.0 2.1  PHOS 3.5 3.5 3.4   Recent Labs    09/25/20 0456 09/26/20 0514 09/27/20 0442  AST 22 21 23   ALT 13 14 16   ALKPHOS 79 81 80  BILITOT 0.4 0.5 0.6  PROT 5.6* 5.7* 6.6  ALBUMIN 2.8* 3.1* 3.3*   Recent Labs    09/25/20 0456 09/26/20 0514 09/27/20 0442  WBC 4.4 3.6* 4.1  NEUTROABS 3.0 2.2 2.6  HGB 9.1* 8.7* 9.3*  HCT 30.5* 28.9* 30.8*  MCV 83.8 83.8 83.2  PLT 227 265 284    Significant Diagnostic Results in last 30 days:  CT ABDOMEN PELVIS WO CONTRAST  Result Date: 09/22/2020 CLINICAL DATA:  Acute abdominal pain. EXAM: CT ABDOMEN AND PELVIS WITHOUT CONTRAST TECHNIQUE: Multidetector CT imaging of the abdomen and pelvis was performed following the standard protocol without IV contrast. COMPARISON:  March 09, 2020. FINDINGS: Lower chest: No acute abnormality. Hepatobiliary: Cholelithiasis is noted without biliary dilatation. The liver is  unremarkable. Pancreas: Grossly stable 1.8 cm hypodense area seen in pancreatic head. No pancreatic ductal dilatation or surrounding inflammatory changes. Spleen: Stable splenic cyst. Adrenals/Urinary Tract: Adrenal glands appear normal. Stable left renal cysts are noted. No hydronephrosis or renal obstruction is noted. No renal or ureteral calculi are noted. Urinary bladder somewhat obscured due to scatter artifact arising from left hip prosthesis. Visualized portion is unremarkable. Stomach/Bowel: Stomach is within normal limits. Appendix appears normal. No evidence of bowel wall thickening, distention, or inflammatory changes. Vascular/Lymphatic: Aortic atherosclerosis.  No enlarged abdominal or pelvic lymph nodes. Reproductive: No definite mass or other abnormality is noted. Other: No abdominal wall hernia or abnormality. No abdominopelvic ascites. Musculoskeletal: As noted above, status post left total hip arthroplasty. Status post surgical repair of old proximal right femoral fracture. No acute osseous abnormality is noted. IMPRESSION: 1. Cholelithiasis without biliary dilatation. 2. Stable 1.8 cm hypodense area seen in pancreatic head. Recommend follow up pre and post contrast MRI/MRCP or pancreatic protocol CT in 2 years. This recommendation follows ACR consensus guidelines: Management of Incidental Pancreatic Cysts: A White Paper of the ACR Incidental Findings Committee. J Am Coll Radiol 9242;68:[id number]. 3. Stable left renal cysts. 4. No hydronephrosis or renal obstruction is noted. 5. No renal or ureteral calculi are noted. 6. Aortic atherosclerosis. Aortic Atherosclerosis (ICD10-I70.0). Electronically Signed   By: Marijo Conception M.D.   On: 09/22/2020 14:08   DG Chest 2 View  Result Date: 09/22/2020 CLINICAL DATA:  Cough and weakness. EXAM: CHEST - 2 VIEW COMPARISON:  07/07/2020 FINDINGS: Right upper lobe mass lesion unchanged approximately 3.5 x 4.5 cm. Smaller 1 cm nodule left lateral midlung  unchanged from 05/20/2020. Negative for heart failure or pneumonia.  No effusion. Bilateral shoulder replacement. IMPRESSION: Mass lesion right upper lobe unchanged consistent with carcinoma. Second nodule in the left upper lobe may represent metastatic disease or a second neoplasm. Electronically Signed   By: Franchot Gallo M.D.   On: 09/22/2020 14:07   CT Head Wo Contrast  Result Date: 09/22/2020 CLINICAL DATA:  Delirium EXAM: CT HEAD WITHOUT CONTRAST TECHNIQUE: Contiguous axial images were obtained from the base of the skull through the vertex without intravenous contrast. COMPARISON:  07/07/2020 FINDINGS: Brain: No evidence of acute infarction, hemorrhage, hydrocephalus, extra-axial collection or mass lesion/mass effect. Moderate low-density changes within the periventricular and subcortical white matter compatible with chronic microvascular ischemic change. Moderate diffuse cerebral volume loss, most pronounced within the temporal lobes. Vascular: Atherosclerotic calcifications involving the large vessels of the skull base. No unexpected hyperdense vessel. Skull: Normal. Negative for fracture or focal lesion. Sinuses/Orbits: Scattered bilateral mastoid air cell opacification. Paranasal sinuses are clear. Unremarkable orbital structures. Other: None. IMPRESSION: 1. No acute intracranial findings. 2. Chronic microvascular ischemic change and cerebral volume loss. 3. Scattered bilateral mastoid air cell opacification. Electronically Signed   By: Davina Poke D.O.   On: 09/22/2020 11:28    Assessment/Plan  1. Cough -  Will start guaifenesin 100 mg / 5 mL give 10 mL = 200 mg every 6 hours x5 days  2. Pulmonary mass - POA does not further testing/treatment -Palliative care consult  3. COVID-19 virus infection -Completed remdesivir treatment -Remains to be in isolation room  4. Primary hypertension -Stable, continue amlodipine  5. Generalized weakness -Instructed staff to assist resident  when going to the toilet to prevent falls, frequent monitoring -Continue PT and OT, for therapeutic and strengthening exercises  6. Anemia due to other cause, not classified Lab Results  Component Value Date   HGB 9.3 (L) 09/27/2020   -Continue ferrous sulfate  7.  Essential hypertension  -  Will discontinue Amlodipine and monitor BPs     Family/ staff Communication: Discussed plan of care with resident and charge nurse.  Labs/tests ordered: CBC and BMP  Goals of care:   Short-term care   Durenda Age, DNP, MSN, FNP-BC [facility] [phone] (Monday-Friday 8:00 a.m. - 5:00 p.m.) [phone] (after hours)

## 2020-10-09 ENCOUNTER — Non-Acute Institutional Stay (SKILLED_NURSING_FACILITY): Payer: Medicare HMO | Admitting: Adult Health

## 2020-10-09 ENCOUNTER — Encounter: Payer: Self-pay | Admitting: Adult Health

## 2020-10-09 DIAGNOSIS — R918 Other nonspecific abnormal finding of lung field: Secondary | ICD-10-CM

## 2020-10-09 DIAGNOSIS — K219 Gastro-esophageal reflux disease without esophagitis: Secondary | ICD-10-CM

## 2020-10-09 DIAGNOSIS — F0390 Unspecified dementia without behavioral disturbance: Secondary | ICD-10-CM

## 2020-10-09 DIAGNOSIS — M0579 Rheumatoid arthritis with rheumatoid factor of multiple sites without organ or systems involvement: Secondary | ICD-10-CM

## 2020-10-09 DIAGNOSIS — I2782 Chronic pulmonary embolism: Secondary | ICD-10-CM | POA: Diagnosis not present

## 2020-10-09 DIAGNOSIS — F339 Major depressive disorder, recurrent, unspecified: Secondary | ICD-10-CM

## 2020-10-09 DIAGNOSIS — U071 COVID-19: Secondary | ICD-10-CM | POA: Diagnosis not present

## 2020-10-09 DIAGNOSIS — E785 Hyperlipidemia, unspecified: Secondary | ICD-10-CM

## 2020-10-09 DIAGNOSIS — J9611 Chronic respiratory failure with hypoxia: Secondary | ICD-10-CM

## 2020-10-09 NOTE — Progress Notes (Signed)
Location:  [facility] Room Number: 307-A Place of Service:  SNF (31) Provider:  Durenda Age, DNP, FNP-BC  Patient Care Team: Loraine Leriche., MD as PCP - General (Internal Medicine) [place] Registered Nurse [facility])  Extended Emergency Contact Information Primary Emergency Contact: Theador Hawthorne" Address: [address]          [place], [place] [address] Johnnette Litter of [place] Phone: [phone] Mobile Phone: [phone] Relation: Friend Secondary Emergency Contact: Jules Husbands [place] of [place] Phone: [phone] Mobile Phone: [phone] Relation: Other  Code Status:  DNR  Goals of care: Advanced Directive information Advanced Directives 10/09/2020  Does Patient Have a Medical Advance Directive? Yes  Type of Paramedic of [place];Out of facility DNR (pink MOST or yellow form)  Does patient want to make changes to medical advance directive? No - Patient declined  Copy of [place] in Chart? Yes - validated most recent copy scanned in chart (See row information)  Pre-existing out of facility DNR order (yellow form or pink MOST form) Yellow form placed in chart (order not valid for inpatient use)     Chief Complaint  Patient presents with   Discharge Note    Patient is seen for discharge.     HPI:  Pt is an 86 y.o. female seen today for discharge to [facility].  She was admitted to [facility] on 09/27/2020 post [facility] hospitalization 09/22/2020 to 09/27/2020.  She has had multiple falls and increased confusion at home so patient's friend/POA brought her to ED.  She has had Moderna COVID-19 vaccine 1/2.  She tested positive for COVID-19 infection with no respiratory symptoms.  She completed remdesivir treatment while in the hospital.  Generalized weakness was thought to be multifactorial from  advanced Alzheimer's dementia, undiagnosed neoplasm, protein calorie malnutrition, possible adrenal insufficiency secondary to chronic steroid use and COVID-19 infection.  She has history of pulmonary mass on the right upper lobe which is thought to be likely cancerous but family does not want further work-up/treatment.  Palliative care was consulted in the hospital.     Past Medical History:  Diagnosis Date   Arthritis    Cough    loose  started 05/05/15 no fever   Dementia (HCC)    STARTING SIGNS OF DEMENTIA   Depression    TAKING ZOLOFT FOR A LONG TIME   Headache(784.0)    Hypertension    Lung mass    PE (pulmonary thromboembolism) (HCC)    Rheumatoid arthritis (HCC)    Past Surgical History:  Procedure Laterality Date   CARPAL TUNNEL RELEASE     RIGHT   EYE SURGERY     BILATERAL CATARACTS   FRACTURE SURGERY     RIGHT HIP-HAS SCREW   POSTERIOR CERVICAL FUSION/FORAMINOTOMY N/A 09/20/2013   Procedure: CERVICAL THREE TO CERVICAL SEVEN  POSTERIOR CERVICAL FUSION;  Surgeon: Elaina Hoops, MD;  Location: [place] NEURO ORS;  Service: Neurosurgery;  Laterality: N/A;  CERVICAL THREE TO CERVICAL SEVEN  POSTERIOR CERVICAL FUSION   POSTERIOR CERVICAL FUSION/FORAMINOTOMY N/A 05/10/2015   Procedure: Posterior Cervical Laminectomy Diskectomy and Fusion Cervical seven Thoracic one. Exploration of Fusion Removal of Hardware Cervical three to seven;  Surgeon: Kary Kos, MD;  Location: [place] NEURO ORS;  Service: Neurosurgery;  Laterality: N/A;   ROTATOR CUFF REPAIR     RIGHT SHOULDER   SHOULDER ARTHROSCOPY     ? LEFT BONE SPURSS  Allergies  Allergen Reactions   Methotrexate Other (See Comments)    Severe pancytopenia  Severe pancytopenia     Cymbalta [Duloxetine Hcl] Rash   Duloxetine Rash    Rash with Itching 08/20/10    Outpatient Encounter Medications as of 10/09/2020  Medication Sig   acetaminophen (TYLENOL) 325 MG tablet Take 650 mg by mouth every 6 (six) hours as needed  (For Pain).   albuterol (VENTOLIN HFA) 108 (90 Base) MCG/ACT inhaler Inhale 2 puffs into the lungs every 6 (six) hours as needed for wheezing or shortness of breath.   apixaban (ELIQUIS) 5 MG TABS tablet Take 1 tablet (5 mg total) by mouth 2 (two) times daily.   ascorbic acid (VITAMIN C) 500 MG tablet Take 1 tablet (500 mg total) by mouth daily.   atorvastatin (LIPITOR) 10 MG tablet Take 1 tablet (10 mg total) by mouth at bedtime. FOR HYPERCHOLESTEROLEMIA   buPROPion (WELLBUTRIN XL) 300 MG 24 hr tablet Take 1 tablet (300 mg total) by mouth at bedtime.   calcium carbonate (OS-CAL) 600 MG TABS tablet Take 600 mg by mouth daily with breakfast.    donepezil (ARICEPT) 10 MG tablet Take 1 tablet (10 mg total) by mouth 2 (two) times daily.   Ensure (ENSURE) Take 237 mLs by mouth 2 (two) times daily. Lactose reduced   ferrous sulfate 325 (65 FE) MG tablet Take 1 tablet (325 mg total) by mouth 2 (two) times daily with a meal. For Anemia   guaiFENesin (ROBITUSSIN) 100 MG/5ML liquid Take 200 mg by mouth every 6 (six) hours.   hydroxychloroquine (PLAQUENIL) 200 MG tablet Take 200 mg by mouth daily.   leflunomide (ARAVA) 20 MG tablet Take 1 tablet (20 mg total) by mouth daily. Take on M-W-F-Su   memantine (NAMENDA XR) 28 MG CP24 24 hr capsule Take 1 capsule (28 mg total) by mouth daily.   mirtazapine (REMERON) 30 MG tablet Take 1 tablet (30 mg total) by mouth every evening.   Multiple Vitamin (MULTIVITAMIN WITH MINERALS) TABS tablet Take 1 tablet by mouth daily.   Nutritional Supplement LIQD Take 120 mLs by mouth in the morning and at bedtime. MedPass   omeprazole (PRILOSEC) 20 MG capsule Take 1 capsule (20 mg total) by mouth daily.   OXYGEN Inhale 2 L into the lungs as needed (SOB/O2 saturation less than 90%).   predniSONE (DELTASONE) 5 MG tablet Take 5 mg by mouth daily with breakfast.   sertraline (ZOLOFT) 100 MG tablet Take 1.5 tablets (150 mg total) by mouth daily.   vitamin B-12  (CYANOCOBALAMIN) 1000 MCG tablet Take 1 tablet (1,000 mcg total) by mouth daily.   No facility-administered encounter medications on file as of 10/09/2020.    Review of Systems  GENERAL: No fever, or chills  MOUTH and THROAT: Denies oral discomfort, gingival pain or bleeding RESPIRATORY:  +cough CARDIAC: No chest pain, edema or palpitations GI: No abdominal pain, diarrhea, constipation, heart burn, nausea or vomiting GU: Denies dysuria, frequency, hematuria, incontinence, or discharge NEUROLOGICAL: Denies dizziness, syncope, numbness, or headache PSYCHIATRIC: Denies feelings of depression or anxiety. No report of hallucinations, insomnia, paranoia, or agitation    Immunization History  Administered Date(s) Administered   Influenza, High Dose Seasonal PF 09/26/2015, 09/29/2018, 09/26/2019, 09/21/2020   Influenza,inj,Quad PF,6+ Mos 09/21/2013   Moderna SARS-COVID-2 Vaccination 12/23/2019, 01/20/2020   Pneumococcal Conjugate-13 12/09/2016   Pneumococcal Polysaccharide-23 12/23/1996, 02/28/2005, 09/22/2013   Td 12/23/1997   Tdap 09/30/2009, 07/09/2019, 03/02/2020, 06/26/2020   Zoster 01/02/2012   Pertinent  Health Maintenance Due  Topic Date Due   DEXA SCAN  Never done   INFLUENZA VACCINE  Completed   PNA vac Low Risk Adult  Completed    Vitals:   10/09/20 1210  BP: 108/63  Pulse: 84  Resp: 17  Temp: 97.7 F (36.5 C)  TempSrc: Oral  SpO2: 97%  Weight: 98 lb 9.6 oz (44.7 kg)  Height: 5\' 4"  (1.626 m)   Body mass index is 16.92 kg/m.  Physical Exam  GENERAL APPEARANCE:  In no acute distress.  SKIN:  Skin is warm and dry.  MOUTH and THROAT: Lips are without lesions. Oral mucosa is moist and without lesions. Tongue is normal in shape, size, and color and without lesions RESPIRATORY: +ronchi on right upper lung fields, O2 @ 4L/min via [place] CARDIAC: RRR, no extra heart sounds, no edema GI: Abdomen soft, normal BS, no masses, no tenderness EXTREMITIES: Able to  move x4 extremities NEUROLOGICAL: There is no tremor. Speech is clear.  Alert to self, disoriented to time and place. PSYCHIATRIC:  Affect and behavior are appropriate  Labs reviewed: Recent Labs    09/25/20 0456 09/26/20 0514 09/27/20 0442  NA 139 138 139  K 3.8 4.2 3.8  CL 102 102 104  CO2 27 27 27   GLUCOSE 87 94 86  BUN 26* 23 19  CREATININE 1.15* 1.16* 1.13*  CALCIUM 9.1 9.2 9.4  MG 2.0 2.0 2.1  PHOS 3.5 3.5 3.4   Recent Labs    09/25/20 0456 09/26/20 0514 09/27/20 0442  AST 22 21 23   ALT 13 14 16   ALKPHOS 79 81 80  BILITOT 0.4 0.5 0.6  PROT 5.6* 5.7* 6.6  ALBUMIN 2.8* 3.1* 3.3*   Recent Labs    09/25/20 0456 09/26/20 0514 09/27/20 0442  WBC 4.4 3.6* 4.1  NEUTROABS 3.0 2.2 2.6  HGB 9.1* 8.7* 9.3*  HCT 30.5* 28.9* 30.8*  MCV 83.8 83.8 83.2  PLT 227 265 284   Significant Diagnostic Results in last 30 days:  CT ABDOMEN PELVIS WO CONTRAST  Result Date: 09/22/2020 CLINICAL DATA:  Acute abdominal pain. EXAM: CT ABDOMEN AND PELVIS WITHOUT CONTRAST TECHNIQUE: Multidetector CT imaging of the abdomen and pelvis was performed following the standard protocol without IV contrast. COMPARISON:  March 09, 2020. FINDINGS: Lower chest: No acute abnormality. Hepatobiliary: Cholelithiasis is noted without biliary dilatation. The liver is unremarkable. Pancreas: Grossly stable 1.8 cm hypodense area seen in pancreatic head. No pancreatic ductal dilatation or surrounding inflammatory changes. Spleen: Stable splenic cyst. Adrenals/Urinary Tract: Adrenal glands appear normal. Stable left renal cysts are noted. No hydronephrosis or renal obstruction is noted. No renal or ureteral calculi are noted. Urinary bladder somewhat obscured due to scatter artifact arising from left hip prosthesis. Visualized portion is unremarkable. Stomach/Bowel: Stomach is within normal limits. Appendix appears normal. No evidence of bowel wall thickening, distention, or inflammatory changes. Vascular/Lymphatic:  Aortic atherosclerosis. No enlarged abdominal or pelvic lymph nodes. Reproductive: No definite mass or other abnormality is noted. Other: No abdominal wall hernia or abnormality. No abdominopelvic ascites. Musculoskeletal: As noted above, status post left total hip arthroplasty. Status post surgical repair of old proximal right femoral fracture. No acute osseous abnormality is noted. IMPRESSION: 1. Cholelithiasis without biliary dilatation. 2. Stable 1.8 cm hypodense area seen in pancreatic head. Recommend follow up pre and post contrast MRI/MRCP or pancreatic protocol CT in 2 years. This recommendation follows ACR consensus guidelines: Management of Incidental Pancreatic Cysts: A White Paper of the ACR Incidental Findings Committee. J  Am Coll Radiol 1610;96:045-409. 3. Stable left renal cysts. 4. No hydronephrosis or renal obstruction is noted. 5. No renal or ureteral calculi are noted. 6. Aortic atherosclerosis. Aortic Atherosclerosis (ICD10-I70.0). Electronically Signed   By: Marijo Conception M.D.   On: 09/22/2020 14:08   DG Chest 2 View  Result Date: 09/22/2020 CLINICAL DATA:  Cough and weakness. EXAM: CHEST - 2 VIEW COMPARISON:  07/07/2020 FINDINGS: Right upper lobe mass lesion unchanged approximately 3.5 x 4.5 cm. Smaller 1 cm nodule left lateral midlung unchanged from 05/20/2020. Negative for heart failure or pneumonia.  No effusion. Bilateral shoulder replacement. IMPRESSION: Mass lesion right upper lobe unchanged consistent with carcinoma. Second nodule in the left upper lobe may represent metastatic disease or a second neoplasm. Electronically Signed   By: Franchot Gallo M.D.   On: 09/22/2020 14:07   CT Head Wo Contrast  Result Date: 09/22/2020 CLINICAL DATA:  [place] EXAM: CT HEAD WITHOUT CONTRAST TECHNIQUE: Contiguous axial images were obtained from the base of the skull through the vertex without intravenous contrast. COMPARISON:  07/07/2020 FINDINGS: Brain: No evidence of acute infarction,  hemorrhage, hydrocephalus, extra-axial collection or mass lesion/mass effect. Moderate low-density changes within the periventricular and subcortical white matter compatible with chronic microvascular ischemic change. Moderate diffuse cerebral volume loss, most pronounced within the temporal lobes. Vascular: Atherosclerotic calcifications involving the large vessels of the skull base. No unexpected hyperdense vessel. Skull: Normal. Negative for fracture or focal lesion. Sinuses/Orbits: Scattered bilateral mastoid air cell opacification. Paranasal sinuses are clear. Unremarkable orbital structures. Other: None. IMPRESSION: 1. No acute intracranial findings. 2. Chronic microvascular ischemic change and cerebral volume loss. 3. Scattered bilateral mastoid air cell opacification. Electronically Signed   By: Davina Poke D.O.   On: 09/22/2020 11:28    Assessment/Plan  1. Pulmonary mass -  POA does not want further testing/treatment -We will discharge to [facility]  2. COVID-19 virus infection -Completed remdesivir treatment  3. Other chronic pulmonary embolism without acute cor pulmonale (HCC) - apixaban (ELIQUIS) 5 MG TABS tablet; Take 1 tablet (5 mg total) by mouth 2 (two) times daily.    4. Rheumatoid arthritis involving multiple sites with positive rheumatoid factor (HCC) - hydroxychloroquine (PLAQUENIL) 200 MG tablet; Take 1 tablet (200 mg total) by mouth daily.   - leflunomide (ARAVA) 20 MG tablet; Take 1 tablet (20 mg total) by mouth daily. Take on M-W-F-Su   - predniSONE (DELTASONE) 5 MG tablet; Take 1 tablet (5 mg total) by mouth daily with breakfast.    5. Major depression, recurrent, chronic (HCC) - buPROPion (WELLBUTRIN XL) 300 MG 24 hr tablet; Take 1 tablet (300 mg total) by mouth at bedtime.  - mirtazapine (REMERON) 30 MG tablet; Take 1 tablet (30 mg total) by mouth every evening.   - sertraline (ZOLOFT) 100 MG tablet; Take 1.5 tablets (150 mg total) by mouth daily.      6. Gastroesophageal reflux disease without esophagitis -Continue omeprazole DR 20 mg 1 capsule daily  7. Chronic respiratory failure with hypoxia (HCC) - albuterol (VENTOLIN HFA) 108 (90 Base) MCG/ACT inhaler; Inhale 2 puffs into the lungs every 6 (six) hours as needed for wheezing or shortness of breath.  -Continue O2 at 4L/min via [place] continuously  8. Dementia without behavioral disturbance, unspecified dementia type ([place]) -  SLUMS score 2/30, ranging in severe cognitive impairment - donepezil (ARICEPT) 10 MG tablet; Take 1 tablet (10 mg total) by mouth 2 (two) times daily.  Dispense: 60 tablet; Refill: 0 - memantine (  NAMENDA XR) 28 MG CP24 24 hr capsule; Take 1 capsule (28 mg total) by mouth daily.    9. Hyperlipidemia, unspecified hyperlipidemia type - atorvastatin (LIPITOR) 10 MG tablet; Take 1 tablet (10 mg total) by mouth at bedtime.       I have filled out patient's discharge paperwork.    DME provided:  None  Total discharge time: Greater than 30 minutes Greater than 50% was spent in counseling and coordination of care.  Discharge time involved coordination of the discharge process with social worker, nursing staff and therapy department.    Durenda Age, DNP, MSN, FNP-BC [facility] [phone] (Monday-Friday 8:00 a.m. - 5:00 p.m.) [phone] (after hours)

## 2021-06-22 DEATH — deceased

## 2022-02-11 IMAGING — CR DG CHEST 2V
2 series · 2 of 2 positions shown · non-contrast
Comparison: 07/07/2020

CLINICAL DATA: Cough and weakness.

EXAM:
CHEST - 2 VIEW

[w chest pa]
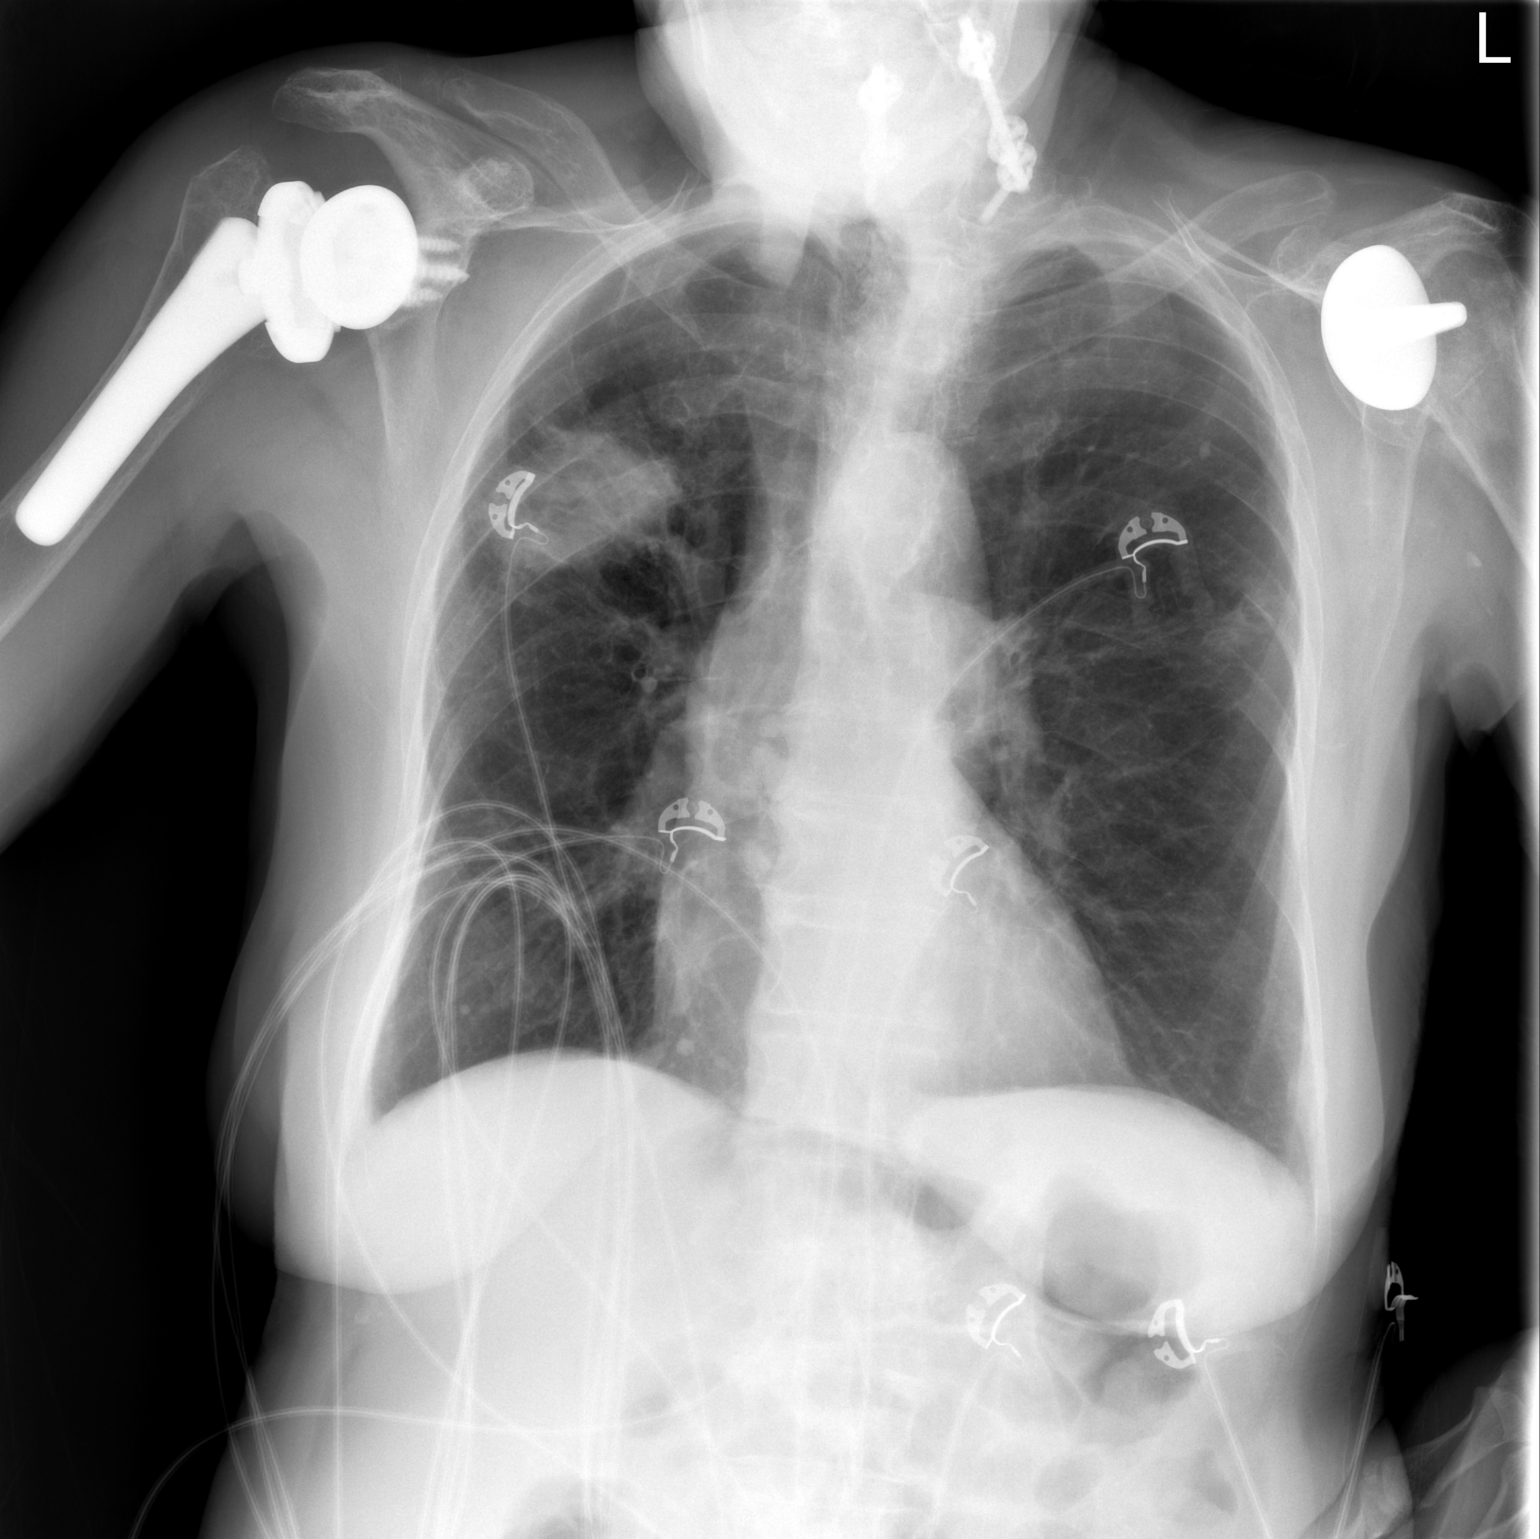

[w chest lat]
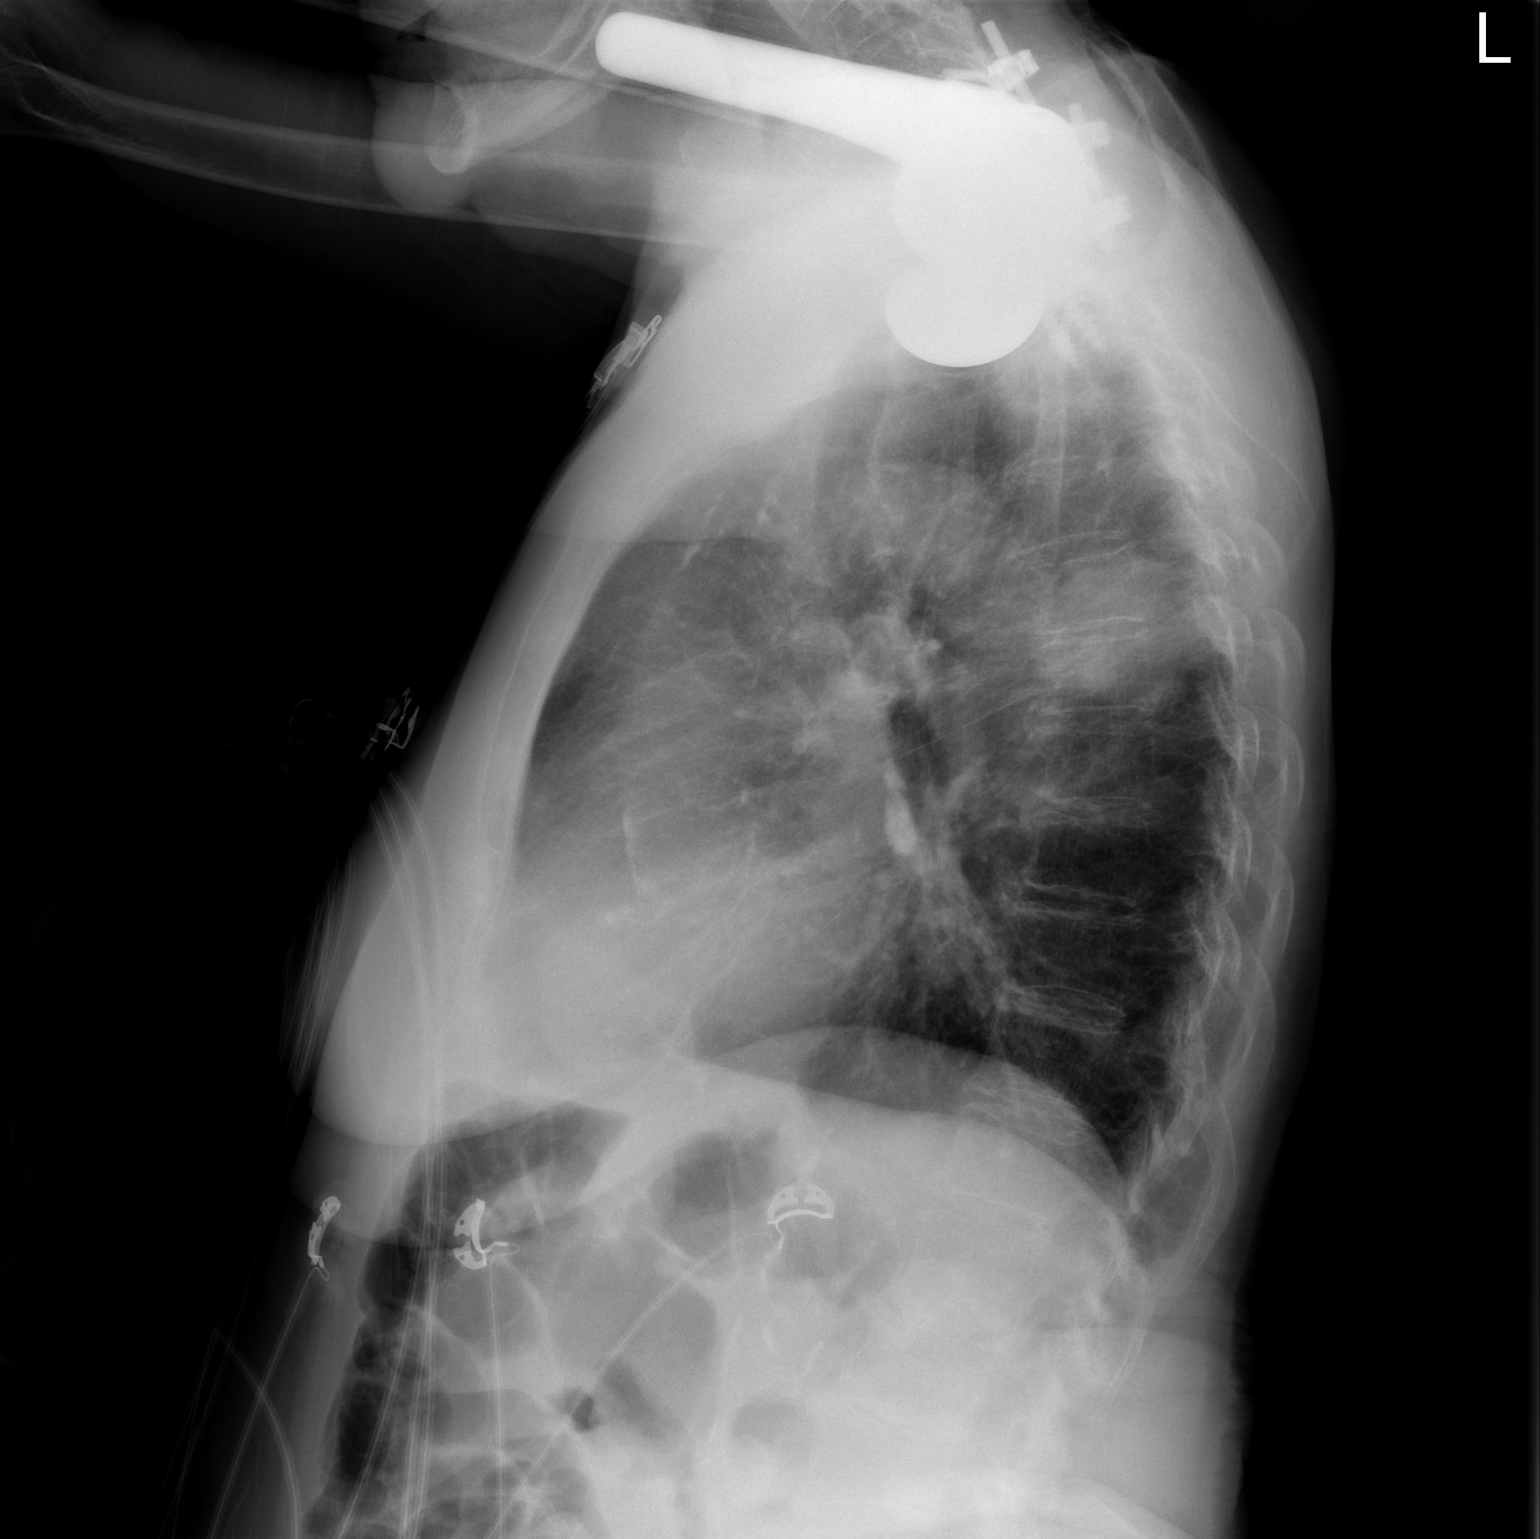

[2 of 2 positions shown; findings below may reference images not displayed]

FINDINGS: Right upper lobe mass lesion unchanged approximately 3.5 x 4.5 cm.

Smaller 1 cm nodule left lateral midlung unchanged from 05/20/2020.

Negative for heart failure or pneumonia.  No effusion.

Bilateral shoulder replacement.
IMPRESSION: Mass lesion right upper lobe unchanged consistent with carcinoma.

Second nodule in the left upper lobe may represent metastatic
disease or a second neoplasm.

## 2022-02-11 IMAGING — CT CT ABD-PELV W/O CM
2 of 4 series · 16 of 46 positions shown, 18 images · non-contrast
Comparison: March 09, 2020.

CLINICAL DATA: Acute abdominal pain.

EXAM:
CT ABDOMEN AND PELVIS WITHOUT CONTRAST
TECHNIQUE: Multidetector CT imaging of the abdomen and pelvis was performed
following the standard protocol without IV contrast.

[Series 2: axial st · axial · 0.74mm/px · z∈[-557,-232]mm · 13 of 71 slices shown, 15 images]
[im 3/71  soft-tissue]
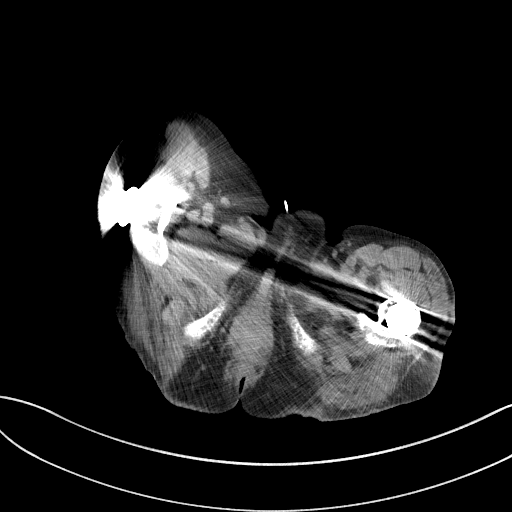
[im 3/71  bone]
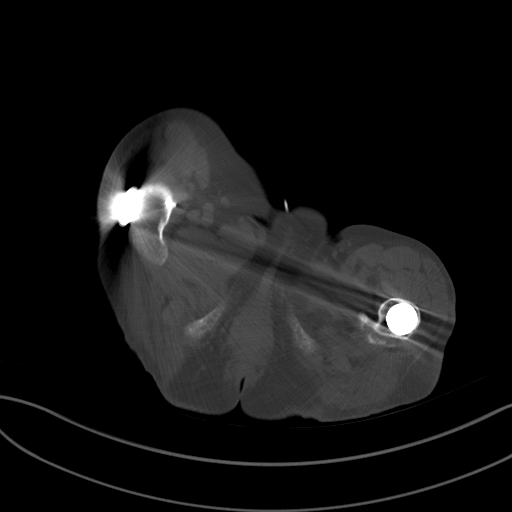
[im 12/71  soft-tissue]
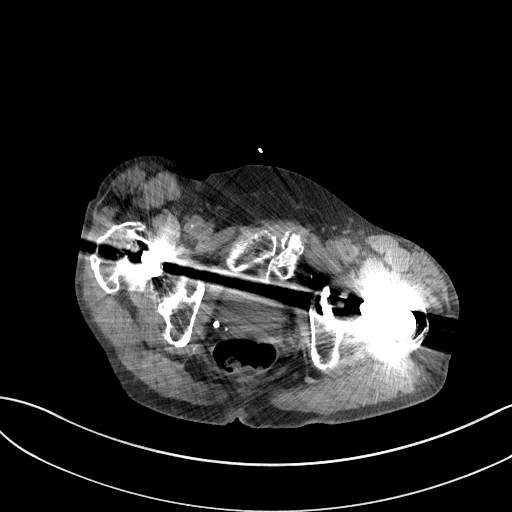
[im 17/71  soft-tissue]
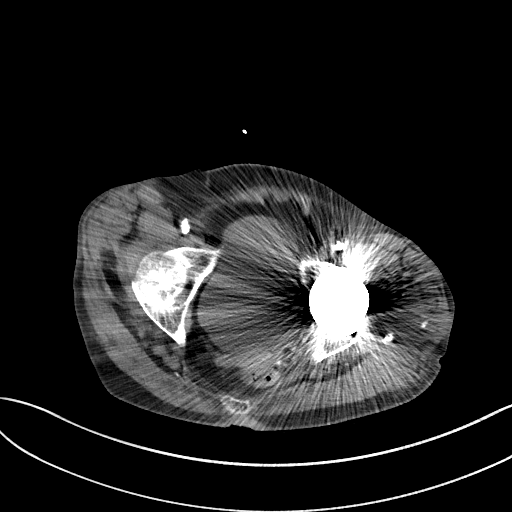
[im 23/71  soft-tissue]
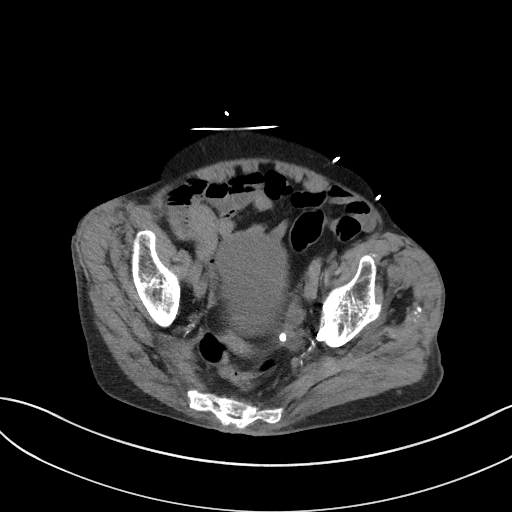
[im 26/71  soft-tissue]
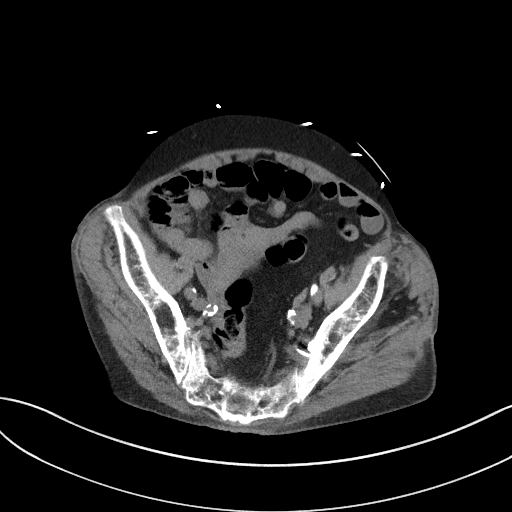
[im 31/71  soft-tissue]
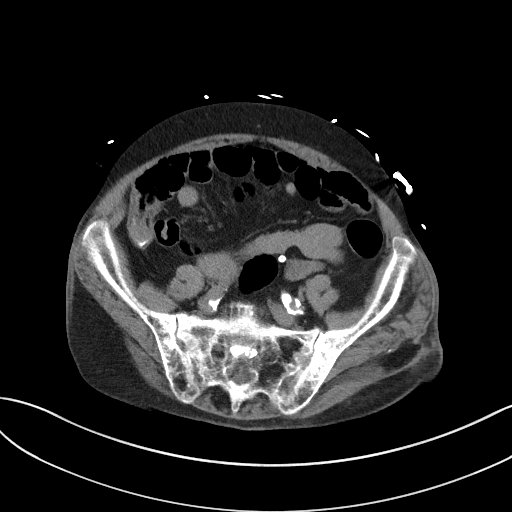
[im 37/71  soft-tissue]
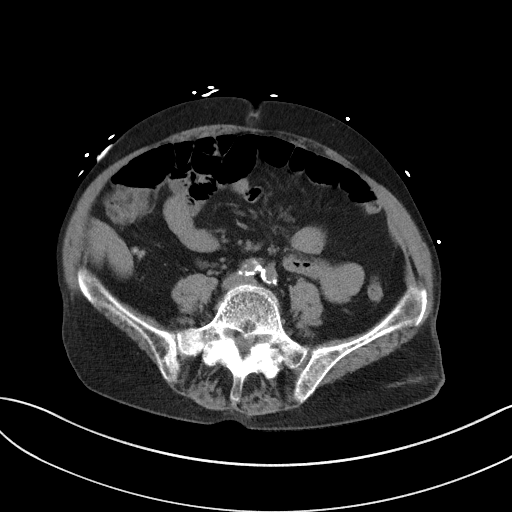
[im 43/71  soft-tissue]
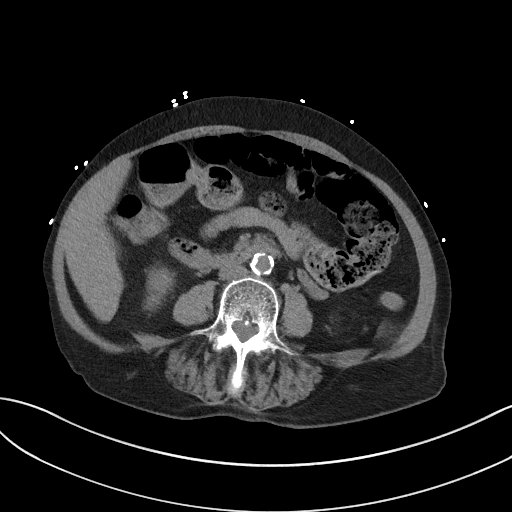
[im 48/71  soft-tissue]
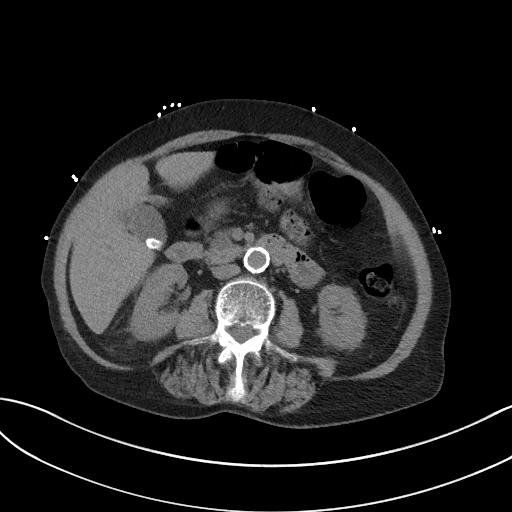
[im 48/71  bone]
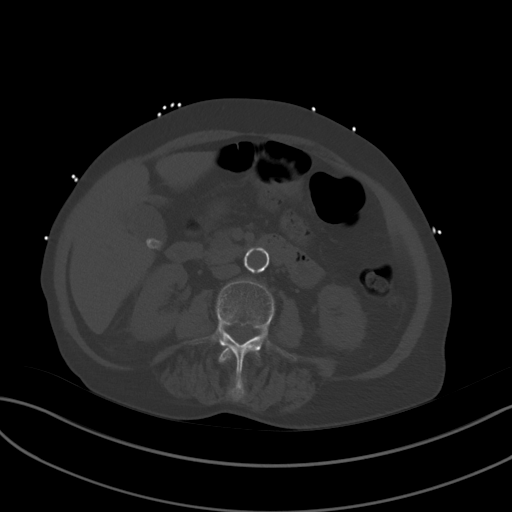
[im 51/71  soft-tissue]
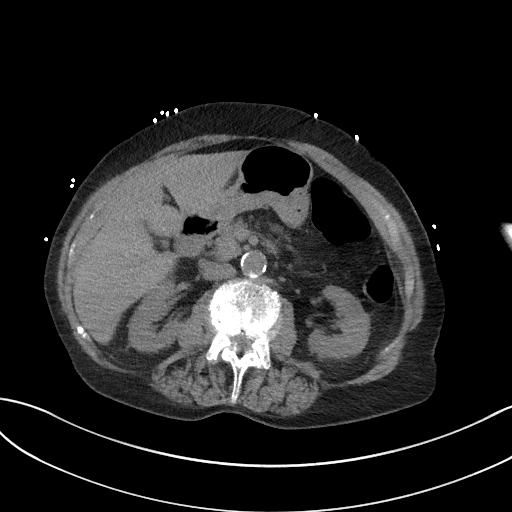
[im 57/71  soft-tissue]
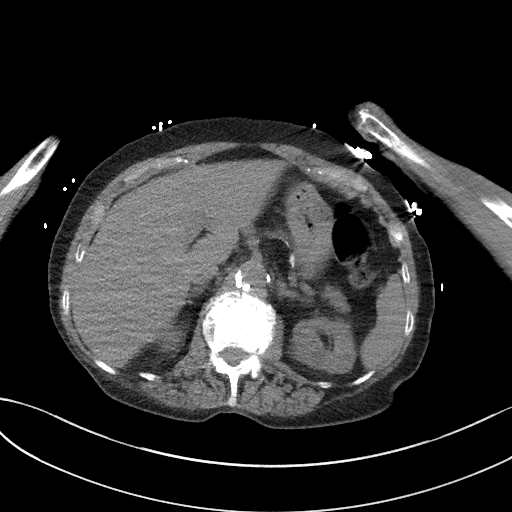
[im 62/71  soft-tissue]
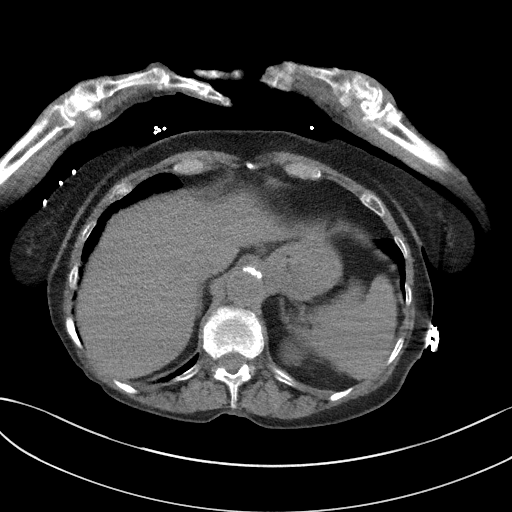
[im 68/71  soft-tissue]
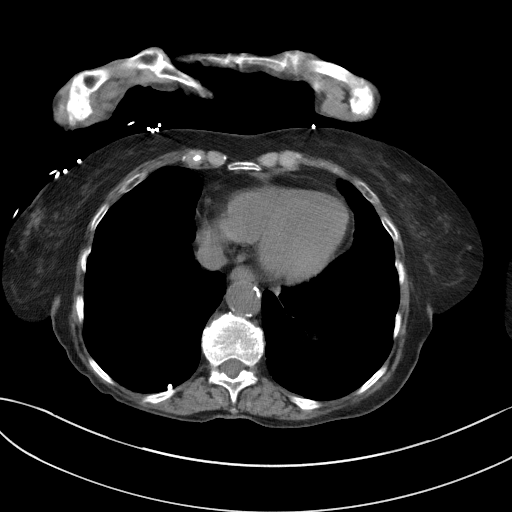

[Series 5: coronal st · coronal · 0.68mm/px · 3 of 89 slices shown]
[im 30/89  soft-tissue]
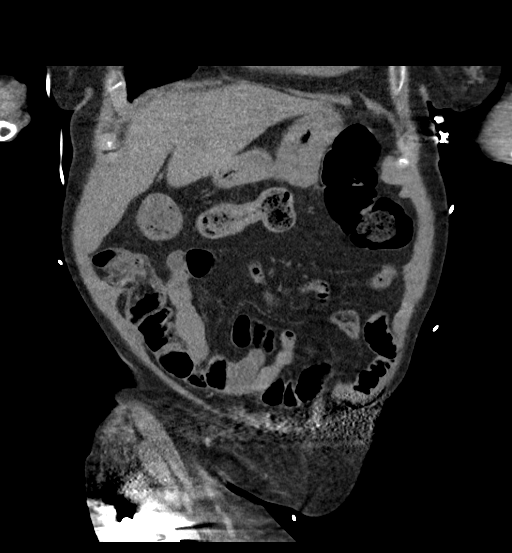
[im 40/89  soft-tissue]
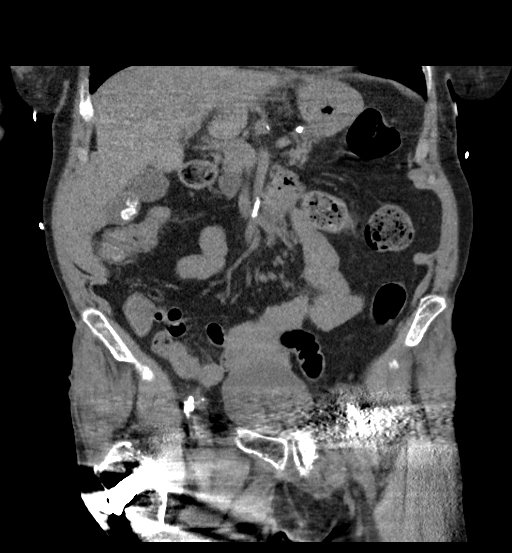
[im 49/89  soft-tissue]
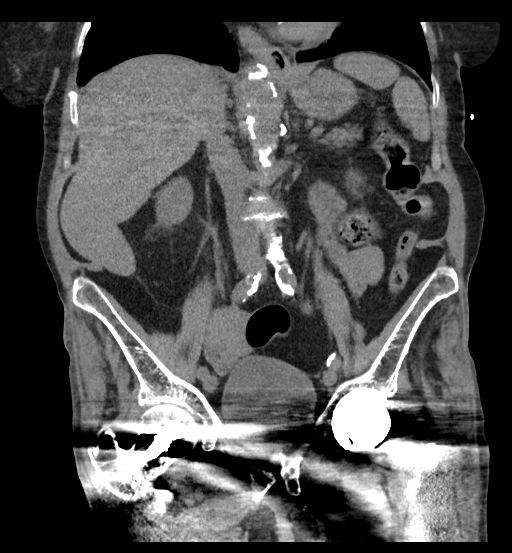

[16 of 46 positions shown; findings below may reference images not displayed]

FINDINGS: Lower chest: No acute abnormality.

Hepatobiliary: Cholelithiasis is noted without biliary dilatation.
The liver is unremarkable.

Pancreas: Grossly stable 1.8 cm hypodense area seen in pancreatic
head. No pancreatic ductal dilatation or surrounding inflammatory
changes.

Spleen: Stable splenic cyst.

Adrenals/Urinary Tract: Adrenal glands appear normal. Stable left
renal cysts are noted. No hydronephrosis or renal obstruction is
noted. No renal or ureteral calculi are noted. Urinary bladder
somewhat obscured due to scatter artifact arising from left hip
prosthesis. Visualized portion is unremarkable.

Stomach/Bowel: Stomach is within normal limits. Appendix appears
normal. No evidence of bowel wall thickening, distention, or
inflammatory changes.

Vascular/Lymphatic: Aortic atherosclerosis. No enlarged abdominal or
pelvic lymph nodes.

Reproductive: No definite mass or other abnormality is noted.

Other: No abdominal wall hernia or abnormality. No abdominopelvic
ascites.

Musculoskeletal: As noted above, status post left total hip
arthroplasty. Status post surgical repair of old proximal right
femoral fracture. No acute osseous abnormality is noted.
IMPRESSION: 1. Cholelithiasis without biliary dilatation.
2. Stable 1.8 cm hypodense area seen in pancreatic head. Recommend
follow up pre and post contrast MRI/MRCP or pancreatic protocol CT
in 2 years. This recommendation follows ACR consensus guidelines:
Management of Incidental Pancreatic Cysts: A White Paper of the ACR
Incidental Findings Committee. [HOSPITAL] 4070;[DATE].
3. Stable left renal cysts.
4. No hydronephrosis or renal obstruction is noted.
5. No renal or ureteral calculi are noted.
6. Aortic atherosclerosis.

Aortic Atherosclerosis ([id number]-[id number].Z).
